# Patient Record
Sex: Female | Born: 1935 | Race: White | Hispanic: No | Marital: Married | Smoking: Never smoker
Health system: Southern US, Community
[De-identification: ages and names within clinical notes are randomized; demographics above are authoritative.]

## PROBLEM LIST (undated history)

## (undated) DIAGNOSIS — M1990 Unspecified osteoarthritis, unspecified site: Secondary | ICD-10-CM

## (undated) DIAGNOSIS — F329 Major depressive disorder, single episode, unspecified: Secondary | ICD-10-CM

## (undated) DIAGNOSIS — Z1379 Encounter for other screening for genetic and chromosomal anomalies: Principal | ICD-10-CM

## (undated) DIAGNOSIS — M545 Low back pain: Secondary | ICD-10-CM

## (undated) DIAGNOSIS — Z923 Personal history of irradiation: Secondary | ICD-10-CM

## (undated) DIAGNOSIS — G8929 Other chronic pain: Secondary | ICD-10-CM

## (undated) DIAGNOSIS — N3090 Cystitis, unspecified without hematuria: Secondary | ICD-10-CM

## (undated) DIAGNOSIS — G4733 Obstructive sleep apnea (adult) (pediatric): Secondary | ICD-10-CM

## (undated) DIAGNOSIS — M549 Dorsalgia, unspecified: Secondary | ICD-10-CM

## (undated) DIAGNOSIS — E7800 Pure hypercholesterolemia, unspecified: Secondary | ICD-10-CM

## (undated) DIAGNOSIS — Z9989 Dependence on other enabling machines and devices: Secondary | ICD-10-CM

## (undated) DIAGNOSIS — C50919 Malignant neoplasm of unspecified site of unspecified female breast: Secondary | ICD-10-CM

## (undated) DIAGNOSIS — F0390 Unspecified dementia without behavioral disturbance: Secondary | ICD-10-CM

## (undated) DIAGNOSIS — E669 Obesity, unspecified: Secondary | ICD-10-CM

## (undated) DIAGNOSIS — F419 Anxiety disorder, unspecified: Secondary | ICD-10-CM

## (undated) DIAGNOSIS — E119 Type 2 diabetes mellitus without complications: Secondary | ICD-10-CM

## (undated) DIAGNOSIS — I10 Essential (primary) hypertension: Secondary | ICD-10-CM

## (undated) DIAGNOSIS — J189 Pneumonia, unspecified organism: Secondary | ICD-10-CM

## (undated) DIAGNOSIS — R51 Headache: Secondary | ICD-10-CM

## (undated) DIAGNOSIS — N8501 Benign endometrial hyperplasia: Secondary | ICD-10-CM

## (undated) DIAGNOSIS — R413 Other amnesia: Secondary | ICD-10-CM

## (undated) HISTORY — DX: Dependence on other enabling machines and devices: Z99.89

## (undated) HISTORY — DX: Low back pain: M54.5

## (undated) HISTORY — DX: Other amnesia: R41.3

## (undated) HISTORY — DX: Encounter for other screening for genetic and chromosomal anomalies: Z13.79

## (undated) HISTORY — PX: MASTECTOMY: SHX3

## (undated) HISTORY — PX: BREAST LUMPECTOMY: SHX2

## (undated) HISTORY — DX: Pure hypercholesterolemia, unspecified: E78.00

## (undated) HISTORY — DX: Other chronic pain: G89.29

## (undated) HISTORY — DX: Major depressive disorder, single episode, unspecified: F32.9

## (undated) HISTORY — DX: Benign endometrial hyperplasia: N85.01

## (undated) HISTORY — DX: Cystitis, unspecified without hematuria: N30.90

## (undated) HISTORY — DX: Headache: R51

## (undated) HISTORY — DX: Obesity, unspecified: E66.9

## (undated) HISTORY — DX: Unspecified osteoarthritis, unspecified site: M19.90

## (undated) HISTORY — PX: FOOT NEUROMA SURGERY: SHX646

## (undated) HISTORY — DX: Obstructive sleep apnea (adult) (pediatric): G47.33

---

## 1960-07-18 HISTORY — PX: TONSILLECTOMY: SUR1361

## 1968-07-18 HISTORY — PX: TUBAL LIGATION: SHX77

## 1998-12-08 ENCOUNTER — Ambulatory Visit (HOSPITAL_COMMUNITY): Admission: RE | Admit: 1998-12-08 | Discharge: 1998-12-08 | Payer: Self-pay | Admitting: Internal Medicine

## 1998-12-08 ENCOUNTER — Encounter: Payer: Self-pay | Admitting: Internal Medicine

## 1998-12-15 ENCOUNTER — Encounter: Payer: Self-pay | Admitting: Internal Medicine

## 1998-12-15 ENCOUNTER — Ambulatory Visit (HOSPITAL_COMMUNITY): Admission: RE | Admit: 1998-12-15 | Discharge: 1998-12-15 | Payer: Self-pay | Admitting: Internal Medicine

## 1999-06-16 ENCOUNTER — Ambulatory Visit (HOSPITAL_COMMUNITY): Admission: RE | Admit: 1999-06-16 | Discharge: 1999-06-16 | Payer: Self-pay | Admitting: Internal Medicine

## 1999-06-16 ENCOUNTER — Encounter: Payer: Self-pay | Admitting: Internal Medicine

## 1999-07-22 ENCOUNTER — Other Ambulatory Visit: Admission: RE | Admit: 1999-07-22 | Discharge: 1999-07-22 | Payer: Self-pay | Admitting: Internal Medicine

## 1999-12-31 ENCOUNTER — Encounter: Payer: Self-pay | Admitting: General Surgery

## 1999-12-31 ENCOUNTER — Encounter: Admission: RE | Admit: 1999-12-31 | Discharge: 1999-12-31 | Payer: Self-pay | Admitting: General Surgery

## 2001-03-05 ENCOUNTER — Ambulatory Visit (HOSPITAL_COMMUNITY): Admission: RE | Admit: 2001-03-05 | Discharge: 2001-03-05 | Payer: Self-pay | Admitting: General Surgery

## 2001-03-05 ENCOUNTER — Encounter: Payer: Self-pay | Admitting: General Surgery

## 2002-07-18 DIAGNOSIS — N8501 Benign endometrial hyperplasia: Secondary | ICD-10-CM

## 2002-07-18 HISTORY — PX: DILATION AND CURETTAGE OF UTERUS: SHX78

## 2002-07-18 HISTORY — DX: Benign endometrial hyperplasia: N85.01

## 2002-11-15 ENCOUNTER — Encounter: Admission: RE | Admit: 2002-11-15 | Discharge: 2002-11-15 | Payer: Self-pay | Admitting: Internal Medicine

## 2002-11-15 ENCOUNTER — Encounter: Payer: Self-pay | Admitting: Internal Medicine

## 2002-12-10 ENCOUNTER — Other Ambulatory Visit: Admission: RE | Admit: 2002-12-10 | Discharge: 2002-12-10 | Payer: Self-pay | Admitting: *Deleted

## 2002-12-12 ENCOUNTER — Ambulatory Visit (HOSPITAL_COMMUNITY): Admission: RE | Admit: 2002-12-12 | Discharge: 2002-12-12 | Payer: Self-pay | Admitting: Internal Medicine

## 2002-12-12 ENCOUNTER — Encounter: Payer: Self-pay | Admitting: Internal Medicine

## 2002-12-19 ENCOUNTER — Encounter: Payer: Self-pay | Admitting: Internal Medicine

## 2002-12-19 ENCOUNTER — Encounter: Admission: RE | Admit: 2002-12-19 | Discharge: 2002-12-19 | Payer: Self-pay | Admitting: Internal Medicine

## 2002-12-20 ENCOUNTER — Ambulatory Visit (HOSPITAL_COMMUNITY): Admission: RE | Admit: 2002-12-20 | Discharge: 2002-12-20 | Payer: Self-pay | Admitting: *Deleted

## 2002-12-20 ENCOUNTER — Encounter (INDEPENDENT_AMBULATORY_CARE_PROVIDER_SITE_OTHER): Payer: Self-pay | Admitting: Specialist

## 2003-07-10 ENCOUNTER — Encounter (INDEPENDENT_AMBULATORY_CARE_PROVIDER_SITE_OTHER): Payer: Self-pay | Admitting: Specialist

## 2003-07-10 ENCOUNTER — Ambulatory Visit (HOSPITAL_COMMUNITY): Admission: RE | Admit: 2003-07-10 | Discharge: 2003-07-10 | Payer: Self-pay | Admitting: *Deleted

## 2003-12-14 ENCOUNTER — Encounter: Admission: RE | Admit: 2003-12-14 | Discharge: 2003-12-14 | Payer: Self-pay | Admitting: Internal Medicine

## 2005-04-27 ENCOUNTER — Encounter: Admission: RE | Admit: 2005-04-27 | Discharge: 2005-04-27 | Payer: Self-pay | Admitting: General Surgery

## 2005-05-09 ENCOUNTER — Encounter (INDEPENDENT_AMBULATORY_CARE_PROVIDER_SITE_OTHER): Payer: Self-pay | Admitting: Radiology

## 2005-05-09 ENCOUNTER — Encounter (INDEPENDENT_AMBULATORY_CARE_PROVIDER_SITE_OTHER): Payer: Self-pay | Admitting: *Deleted

## 2005-05-09 ENCOUNTER — Encounter: Admission: RE | Admit: 2005-05-09 | Discharge: 2005-05-09 | Payer: Self-pay | Admitting: General Surgery

## 2005-05-19 ENCOUNTER — Encounter: Admission: RE | Admit: 2005-05-19 | Discharge: 2005-05-19 | Payer: Self-pay | Admitting: General Surgery

## 2005-05-20 ENCOUNTER — Encounter (INDEPENDENT_AMBULATORY_CARE_PROVIDER_SITE_OTHER): Payer: Self-pay | Admitting: Specialist

## 2005-05-20 ENCOUNTER — Encounter: Admission: RE | Admit: 2005-05-20 | Discharge: 2005-05-20 | Payer: Self-pay | Admitting: General Surgery

## 2005-07-01 ENCOUNTER — Encounter (INDEPENDENT_AMBULATORY_CARE_PROVIDER_SITE_OTHER): Payer: Self-pay | Admitting: *Deleted

## 2005-07-01 ENCOUNTER — Ambulatory Visit (HOSPITAL_COMMUNITY): Admission: RE | Admit: 2005-07-01 | Discharge: 2005-07-01 | Payer: Self-pay | Admitting: General Surgery

## 2005-07-01 ENCOUNTER — Encounter: Admission: RE | Admit: 2005-07-01 | Discharge: 2005-07-01 | Payer: Self-pay | Admitting: General Surgery

## 2005-07-01 ENCOUNTER — Ambulatory Visit (HOSPITAL_BASED_OUTPATIENT_CLINIC_OR_DEPARTMENT_OTHER): Admission: RE | Admit: 2005-07-01 | Discharge: 2005-07-01 | Payer: Self-pay | Admitting: General Surgery

## 2005-07-04 ENCOUNTER — Ambulatory Visit: Payer: Self-pay | Admitting: Oncology

## 2005-08-16 ENCOUNTER — Ambulatory Visit (HOSPITAL_COMMUNITY): Admission: RE | Admit: 2005-08-16 | Discharge: 2005-08-16 | Payer: Self-pay | Admitting: Oncology

## 2005-08-22 ENCOUNTER — Ambulatory Visit: Admission: RE | Admit: 2005-08-22 | Discharge: 2005-10-26 | Payer: Self-pay | Admitting: Radiation Oncology

## 2005-08-25 ENCOUNTER — Encounter: Admission: RE | Admit: 2005-08-25 | Discharge: 2005-08-25 | Payer: Self-pay | Admitting: Oncology

## 2005-10-17 ENCOUNTER — Ambulatory Visit: Payer: Self-pay | Admitting: Oncology

## 2006-01-09 ENCOUNTER — Encounter: Admission: RE | Admit: 2006-01-09 | Discharge: 2006-01-09 | Payer: Self-pay | Admitting: General Surgery

## 2006-01-11 ENCOUNTER — Encounter: Admission: RE | Admit: 2006-01-11 | Discharge: 2006-01-11 | Payer: Self-pay | Admitting: General Surgery

## 2006-01-11 ENCOUNTER — Encounter (INDEPENDENT_AMBULATORY_CARE_PROVIDER_SITE_OTHER): Payer: Self-pay | Admitting: Specialist

## 2006-01-16 ENCOUNTER — Ambulatory Visit: Payer: Self-pay | Admitting: Oncology

## 2006-01-23 LAB — CBC WITH DIFFERENTIAL/PLATELET
BASO%: 0.3 % (ref 0.0–2.0)
Basophils Absolute: 0 10*3/uL (ref 0.0–0.1)
Eosinophils Absolute: 0.1 10*3/uL (ref 0.0–0.5)
HCT: 36.9 % (ref 34.8–46.6)
HGB: 12.7 g/dL (ref 11.6–15.9)
LYMPH%: 27.5 % (ref 14.0–48.0)
MCHC: 34.4 g/dL (ref 32.0–36.0)
MCV: 92.1 fL (ref 81.0–101.0)
MONO#: 0.5 10*3/uL (ref 0.1–0.9)
MONO%: 8.2 % (ref 0.0–13.0)
NEUT#: 4.1 10*3/uL (ref 1.5–6.5)
Platelets: 243 10*3/uL (ref 145–400)

## 2006-01-23 LAB — COMPREHENSIVE METABOLIC PANEL
AST: 17 U/L (ref 0–37)
Albumin: 4.3 g/dL (ref 3.5–5.2)
Alkaline Phosphatase: 75 U/L (ref 39–117)
BUN: 31 mg/dL — ABNORMAL HIGH (ref 6–23)
Calcium: 9.9 mg/dL (ref 8.4–10.5)
Chloride: 104 mEq/L (ref 96–112)
Potassium: 5.1 mEq/L (ref 3.5–5.3)
Sodium: 138 mEq/L (ref 135–145)
Total Protein: 7.2 g/dL (ref 6.0–8.3)

## 2006-01-23 LAB — CANCER ANTIGEN 27.29: CA 27.29: 19 U/mL (ref 0–39)

## 2006-03-08 ENCOUNTER — Ambulatory Visit: Payer: Self-pay | Admitting: Oncology

## 2006-05-03 ENCOUNTER — Encounter: Admission: RE | Admit: 2006-05-03 | Discharge: 2006-05-03 | Payer: Self-pay | Admitting: General Surgery

## 2006-07-21 ENCOUNTER — Ambulatory Visit: Payer: Self-pay | Admitting: Oncology

## 2006-07-26 LAB — COMPREHENSIVE METABOLIC PANEL
ALT: 11 U/L (ref 0–35)
AST: 14 U/L (ref 0–37)
Albumin: 4.4 g/dL (ref 3.5–5.2)
Alkaline Phosphatase: 69 U/L (ref 39–117)
BUN: 26 mg/dL — ABNORMAL HIGH (ref 6–23)
CO2: 25 mEq/L (ref 19–32)
Chloride: 107 mEq/L (ref 96–112)
Creatinine, Ser: 1.36 mg/dL — ABNORMAL HIGH (ref 0.40–1.20)
Total Bilirubin: 0.4 mg/dL (ref 0.3–1.2)
Total Protein: 7.3 g/dL (ref 6.0–8.3)

## 2006-07-26 LAB — CBC WITH DIFFERENTIAL/PLATELET
Basophils Absolute: 0 10*3/uL (ref 0.0–0.1)
HCT: 37.9 % (ref 34.8–46.6)
LYMPH%: 28 % (ref 14.0–48.0)
MCH: 31.8 pg (ref 26.0–34.0)
MCV: 94.8 fL (ref 81.0–101.0)
MONO#: 0.3 10*3/uL (ref 0.1–0.9)
NEUT%: 62.4 % (ref 39.6–76.8)
RDW: 13.6 % (ref 11.3–14.5)

## 2007-01-22 ENCOUNTER — Ambulatory Visit: Payer: Self-pay | Admitting: Oncology

## 2007-01-24 LAB — CBC WITH DIFFERENTIAL/PLATELET
BASO%: 0.5 % (ref 0.0–2.0)
Basophils Absolute: 0 10*3/uL (ref 0.0–0.1)
EOS%: 1.9 % (ref 0.0–7.0)
HCT: 37.4 % (ref 34.8–46.6)
MCH: 32 pg (ref 26.0–34.0)
MONO#: 0.6 10*3/uL (ref 0.1–0.9)
MONO%: 11.6 % (ref 0.0–13.0)
RBC: 4.07 10*6/uL (ref 3.70–5.32)
WBC: 4.8 10*3/uL (ref 3.9–10.0)

## 2007-01-24 LAB — COMPREHENSIVE METABOLIC PANEL
ALT: 12 U/L (ref 0–35)
Alkaline Phosphatase: 69 U/L (ref 39–117)
CO2: 26 mEq/L (ref 19–32)
Chloride: 99 mEq/L (ref 96–112)
Potassium: 4.6 mEq/L (ref 3.5–5.3)
Total Bilirubin: 0.6 mg/dL (ref 0.3–1.2)
Total Protein: 7 g/dL (ref 6.0–8.3)

## 2007-03-15 ENCOUNTER — Encounter: Admission: RE | Admit: 2007-03-15 | Discharge: 2007-03-15 | Payer: Self-pay | Admitting: Orthopedic Surgery

## 2007-03-27 ENCOUNTER — Ambulatory Visit: Payer: Self-pay | Admitting: Oncology

## 2007-04-19 ENCOUNTER — Other Ambulatory Visit: Admission: RE | Admit: 2007-04-19 | Discharge: 2007-04-19 | Payer: Self-pay | Admitting: Obstetrics & Gynecology

## 2007-05-10 ENCOUNTER — Encounter: Admission: RE | Admit: 2007-05-10 | Discharge: 2007-05-10 | Payer: Self-pay | Admitting: Oncology

## 2007-05-17 ENCOUNTER — Encounter: Admission: RE | Admit: 2007-05-17 | Discharge: 2007-05-17 | Payer: Self-pay | Admitting: Oncology

## 2007-05-24 ENCOUNTER — Ambulatory Visit: Payer: Self-pay | Admitting: Oncology

## 2007-07-23 ENCOUNTER — Ambulatory Visit: Payer: Self-pay | Admitting: Oncology

## 2007-07-25 ENCOUNTER — Ambulatory Visit (HOSPITAL_COMMUNITY): Admission: RE | Admit: 2007-07-25 | Discharge: 2007-07-25 | Payer: Self-pay | Admitting: Oncology

## 2007-07-25 LAB — CBC WITH DIFFERENTIAL/PLATELET
BASO%: 0.4 % (ref 0.0–2.0)
EOS%: 3.7 % (ref 0.0–7.0)
Eosinophils Absolute: 0.2 10*3/uL (ref 0.0–0.5)
LYMPH%: 35.8 % (ref 14.0–48.0)
MCHC: 34.7 g/dL (ref 32.0–36.0)
MONO#: 0.4 10*3/uL (ref 0.1–0.9)
MONO%: 7.6 % (ref 0.0–13.0)
NEUT%: 52.5 % (ref 39.6–76.8)
RDW: 12.7 % (ref 11.3–14.5)
lymph#: 1.9 10*3/uL (ref 0.9–3.3)

## 2007-07-25 LAB — COMPREHENSIVE METABOLIC PANEL
Albumin: 3.5 g/dL (ref 3.5–5.2)
Alkaline Phosphatase: 58 U/L (ref 39–117)
CO2: 29 mEq/L (ref 19–32)
Calcium: 9.8 mg/dL (ref 8.4–10.5)
Chloride: 105 mEq/L (ref 96–112)
Glucose, Bld: 109 mg/dL — ABNORMAL HIGH (ref 70–99)
Potassium: 4.8 mEq/L (ref 3.5–5.3)
Sodium: 140 mEq/L (ref 135–145)
Total Protein: 6.7 g/dL (ref 6.0–8.3)

## 2008-04-23 ENCOUNTER — Other Ambulatory Visit: Admission: RE | Admit: 2008-04-23 | Discharge: 2008-04-23 | Payer: Self-pay | Admitting: Obstetrics & Gynecology

## 2008-05-06 ENCOUNTER — Ambulatory Visit: Payer: Self-pay | Admitting: Oncology

## 2008-05-08 LAB — CBC WITH DIFFERENTIAL/PLATELET
BASO%: 0.5 % (ref 0.0–2.0)
HCT: 33.3 % — ABNORMAL LOW (ref 34.8–46.6)
HGB: 11.4 g/dL — ABNORMAL LOW (ref 11.6–15.9)
MCHC: 34.2 g/dL (ref 32.0–36.0)
NEUT#: 2.4 10*3/uL (ref 1.5–6.5)
NEUT%: 50.3 % (ref 39.6–76.8)
Platelets: 259 10*3/uL (ref 145–400)
RBC: 3.57 10*6/uL — ABNORMAL LOW (ref 3.70–5.32)
RDW: 13.1 % (ref 11.3–14.5)

## 2008-05-08 LAB — COMPREHENSIVE METABOLIC PANEL
AST: 12 U/L (ref 0–37)
Albumin: 4 g/dL (ref 3.5–5.2)
Alkaline Phosphatase: 39 U/L (ref 39–117)
BUN: 47 mg/dL — ABNORMAL HIGH (ref 6–23)
CO2: 27 mEq/L (ref 19–32)
Calcium: 9.8 mg/dL (ref 8.4–10.5)
Creatinine, Ser: 2.55 mg/dL — ABNORMAL HIGH (ref 0.40–1.20)
Potassium: 4.7 mEq/L (ref 3.5–5.3)
Total Protein: 6.5 g/dL (ref 6.0–8.3)

## 2008-05-14 ENCOUNTER — Encounter: Admission: RE | Admit: 2008-05-14 | Discharge: 2008-05-14 | Payer: Self-pay | Admitting: Oncology

## 2008-06-05 ENCOUNTER — Encounter: Admission: RE | Admit: 2008-06-05 | Discharge: 2008-06-05 | Payer: Self-pay | Admitting: Internal Medicine

## 2008-09-17 ENCOUNTER — Encounter (INDEPENDENT_AMBULATORY_CARE_PROVIDER_SITE_OTHER): Payer: Self-pay | Admitting: *Deleted

## 2008-09-17 ENCOUNTER — Ambulatory Visit (HOSPITAL_COMMUNITY): Admission: RE | Admit: 2008-09-17 | Discharge: 2008-09-17 | Payer: Self-pay | Admitting: *Deleted

## 2009-05-05 ENCOUNTER — Ambulatory Visit: Payer: Self-pay | Admitting: Oncology

## 2009-05-07 LAB — CBC WITH DIFFERENTIAL/PLATELET
Eosinophils Absolute: 0.1 10*3/uL (ref 0.0–0.5)
HGB: 12.2 g/dL (ref 11.6–15.9)
MCH: 31.3 pg (ref 25.1–34.0)
MCHC: 34 g/dL (ref 31.5–36.0)
MCV: 91.9 fL (ref 79.5–101.0)
MONO%: 8.3 % (ref 0.0–14.0)
NEUT#: 5.8 10*3/uL (ref 1.5–6.5)
NEUT%: 71.2 % (ref 38.4–76.8)
Platelets: 246 10*3/uL (ref 145–400)
RBC: 3.91 10*6/uL (ref 3.70–5.45)
WBC: 8.1 10*3/uL (ref 3.9–10.3)

## 2009-05-07 LAB — COMPREHENSIVE METABOLIC PANEL
Calcium: 9.4 mg/dL (ref 8.4–10.5)
Chloride: 104 mEq/L (ref 96–112)

## 2009-12-07 ENCOUNTER — Encounter: Admission: RE | Admit: 2009-12-07 | Discharge: 2009-12-07 | Payer: Self-pay | Admitting: Obstetrics & Gynecology

## 2010-01-11 ENCOUNTER — Ambulatory Visit: Payer: Self-pay | Admitting: Surgery

## 2010-03-18 DEATH — deceased

## 2010-05-28 ENCOUNTER — Ambulatory Visit: Payer: Self-pay | Admitting: Oncology

## 2010-06-01 LAB — CBC WITH DIFFERENTIAL/PLATELET
BASO%: 0.3 % (ref 0.0–2.0)
Basophils Absolute: 0 10*3/uL (ref 0.0–0.1)
EOS%: 1.9 % (ref 0.0–7.0)
LYMPH%: 27.4 % (ref 14.0–49.7)
MCH: 31.9 pg (ref 25.1–34.0)
MONO#: 0.6 10*3/uL (ref 0.1–0.9)
MONO%: 10.2 % (ref 0.0–14.0)
NEUT%: 60.2 % (ref 38.4–76.8)
RBC: 3.83 10*6/uL (ref 3.70–5.45)

## 2010-06-01 LAB — COMPREHENSIVE METABOLIC PANEL
ALT: 15 U/L (ref 0–35)
Alkaline Phosphatase: 50 U/L (ref 39–117)
CO2: 30 mEq/L (ref 19–32)
Calcium: 10 mg/dL (ref 8.4–10.5)
Chloride: 105 mEq/L (ref 96–112)
Glucose, Bld: 99 mg/dL (ref 70–99)
Potassium: 4.8 mEq/L (ref 3.5–5.3)
Total Bilirubin: 0.5 mg/dL (ref 0.3–1.2)

## 2010-08-07 ENCOUNTER — Encounter: Payer: Self-pay | Admitting: General Surgery

## 2010-10-28 LAB — GLUCOSE, CAPILLARY: Glucose-Capillary: 90 mg/dL (ref 70–99)

## 2010-11-30 NOTE — Op Note (Signed)
NAME:  MIDGIE, ACAMPORA NO.:  [id number]   MEDICAL RECORD NO.:  [id number]          PATIENT TYPE:  AMB   LOCATION:  ENDO                         FACILITY:  [facility]   PHYSICIAN:  Waverly Ferrari, M.D.    DATE OF BIRTH:  10/13/1933   DATE OF PROCEDURE:  09/17/2008  DATE OF DISCHARGE:                               OPERATIVE REPORT   PROCEDURE:  Colonoscopy with polypectomy.   INDICATIONS:  Colon polyps.   ANESTHESIA:  Fentanyl 75 mcg, Versed 7 mg.   PROCEDURE:  With the patient mildly sedated in the left lateral  decubitus position, the Pentax videoscopic colonoscope was inserted into  the rectum and passed under direct vision to the cecum, identified by  ileocecal valve and appendiceal orifice, both of which were  photographed.  From this point the colonoscope was slowly withdrawn,  taking circumferential views of colonic mucosa.  The prep was suboptimal  in that we had to suction greenish material from multiple areas, so  small lesions could be missed as we withdrew all the way to  approximately 15 cm from the anal verge at which point a polyp was seen,  photographed and removed using snare cautery technique, setting of  20/150 blended current. The polyp was retrieved by suctioning through  the endoscope into a tissue trap.  The endoscope was then withdrawn to  the rectum, which appeared normal on direct and showed hemorrhoids on  retroflexed view.  The endoscope was straightened and withdrawn through  the anal canal.  The patient's vital signs, pulse oximeter remained  stable.  The patient tolerated procedure well, without apparent  complications.   FINDINGS:  Internal hemorrhoids and polyp at 15 cm from the anal verge.   PLAN:  Await biopsy report.  The patient will call me for results and  follow up with me as an outpatient.           ______________________________  Waverly Ferrari, M.D.     GMO/MEDQ  D:  09/17/2008  T:  09/17/2008  Job:  [id number]

## 2010-11-30 NOTE — Procedures (Signed)
DUPLEX DEEP VENOUS EXAM - LOWER EXTREMITY   INDICATION:  Pain and swelling in right lower extremity.   HISTORY:  Edema:  Right.  Trauma/Surgery:  None.  Pain:  Right.  PE:  None.  Previous DVT:  None.  Anticoagulants:  None.  Other:   DUPLEX EXAM:                CFV   SFV   PopV  PTV    GSV                R  L  R  L  R  L  R   L  R  L  Thrombosis    o  o  o     o     o      o  Spontaneous   +  +  +     +     +      +  Phasic        +  +  +     +     +      +  Augmentation  +  +  +     +     +      +  Compressible  +  +  +     +     +      +  Competent     +  +  +     +     +      +   Legend:  + - yes  o - no  p - partial  D - decreased   IMPRESSION:  The right lower extremity appears to be negative for deep  venous thrombosis.   Attempted to call in preliminary report.  No one called back, so  preliminary report was faxed.    _____________________________  V. Leia Alf, MD   NT/MEDQ  D:  01/11/2010  T:  01/11/2010  Job:  [id number]

## 2010-12-23 ENCOUNTER — Other Ambulatory Visit: Payer: Self-pay | Admitting: Internal Medicine

## 2010-12-23 DIAGNOSIS — R413 Other amnesia: Secondary | ICD-10-CM

## 2010-12-31 ENCOUNTER — Ambulatory Visit
Admission: RE | Admit: 2010-12-31 | Discharge: 2010-12-31 | Disposition: A | Discharge status: Home / Self Care | Payer: Medicare Other | Source: Ambulatory Visit | Attending: Internal Medicine | Admitting: Internal Medicine

## 2010-12-31 DIAGNOSIS — R413 Other amnesia: Secondary | ICD-10-CM

## 2011-01-06 ENCOUNTER — Other Ambulatory Visit: Payer: Self-pay

## 2011-07-19 DEATH — deceased

## 2011-09-07 ENCOUNTER — Other Ambulatory Visit: Payer: Self-pay | Admitting: Internal Medicine

## 2011-09-07 DIAGNOSIS — Z853 Personal history of malignant neoplasm of breast: Secondary | ICD-10-CM

## 2011-09-15 ENCOUNTER — Other Ambulatory Visit: Payer: Self-pay | Admitting: Internal Medicine

## 2011-09-15 ENCOUNTER — Ambulatory Visit
Admission: RE | Admit: 2011-09-15 | Discharge: 2011-09-15 | Disposition: A | Discharge status: Home / Self Care | Payer: Medicare Other | Source: Ambulatory Visit | Attending: Internal Medicine | Admitting: Internal Medicine

## 2011-09-15 DIAGNOSIS — Z853 Personal history of malignant neoplasm of breast: Secondary | ICD-10-CM

## 2012-05-01 ENCOUNTER — Other Ambulatory Visit: Payer: Self-pay | Admitting: Urology

## 2012-05-05 ENCOUNTER — Encounter (HOSPITAL_COMMUNITY): Payer: Self-pay | Admitting: Emergency Medicine

## 2012-05-05 ENCOUNTER — Emergency Department (HOSPITAL_COMMUNITY)
Admission: EM | Admit: 2012-05-05 | Discharge: 2012-05-05 | Disposition: A | Discharge status: Home / Self Care | Payer: Medicare Other | Attending: Emergency Medicine | Admitting: Emergency Medicine

## 2012-05-05 ENCOUNTER — Emergency Department (HOSPITAL_COMMUNITY): Payer: Medicare Other

## 2012-05-05 DIAGNOSIS — R251 Tremor, unspecified: Secondary | ICD-10-CM

## 2012-05-05 DIAGNOSIS — I10 Essential (primary) hypertension: Secondary | ICD-10-CM | POA: Insufficient documentation

## 2012-05-05 DIAGNOSIS — E119 Type 2 diabetes mellitus without complications: Secondary | ICD-10-CM | POA: Insufficient documentation

## 2012-05-05 DIAGNOSIS — F419 Anxiety disorder, unspecified: Secondary | ICD-10-CM

## 2012-05-05 DIAGNOSIS — R259 Unspecified abnormal involuntary movements: Secondary | ICD-10-CM | POA: Insufficient documentation

## 2012-05-05 DIAGNOSIS — F0390 Unspecified dementia without behavioral disturbance: Secondary | ICD-10-CM | POA: Insufficient documentation

## 2012-05-05 DIAGNOSIS — F411 Generalized anxiety disorder: Secondary | ICD-10-CM | POA: Insufficient documentation

## 2012-05-05 HISTORY — DX: Unspecified dementia, unspecified severity, without behavioral disturbance, psychotic disturbance, mood disturbance, and anxiety: F03.90

## 2012-05-05 HISTORY — DX: Type 2 diabetes mellitus without complications: E11.9

## 2012-05-05 HISTORY — DX: Essential (primary) hypertension: I10

## 2012-05-05 LAB — CBC WITH DIFFERENTIAL/PLATELET
Eosinophils Relative: 2 % (ref 0–5)
HCT: 38.8 % (ref 36.0–46.0)
Lymphocytes Relative: 28 % (ref 12–46)
Lymphs Abs: 1.8 10*3/uL (ref 0.7–4.0)
MCV: 92.2 fL (ref 78.0–100.0)
Platelets: 191 10*3/uL (ref 150–400)
RBC: 4.21 MIL/uL (ref 3.87–5.11)
WBC: 6.2 10*3/uL (ref 4.0–10.5)

## 2012-05-05 LAB — BASIC METABOLIC PANEL
CO2: 27 mEq/L (ref 19–32)
Calcium: 9.4 mg/dL (ref 8.4–10.5)
Chloride: 106 mEq/L (ref 96–112)
Glucose, Bld: 83 mg/dL (ref 70–99)
Sodium: 140 mEq/L (ref 135–145)

## 2012-05-05 LAB — URINALYSIS, ROUTINE W REFLEX MICROSCOPIC
Glucose, UA: NEGATIVE mg/dL
Hgb urine dipstick: NEGATIVE
Specific Gravity, Urine: 1.011 (ref 1.005–1.030)
pH: 6 (ref 5.0–8.0)

## 2012-05-05 MED ORDER — ALPRAZOLAM 0.25 MG PO TABS
0.2500 mg | ORAL_TABLET | Freq: Once | ORAL | Status: AC
  Administered 2012-05-05: 0.25 mg via ORAL
  Filled 2012-05-05: qty 1

## 2012-05-05 MED ORDER — ALPRAZOLAM 0.25 MG PO TABS: 0.2500 mg | ORAL_TABLET | Freq: Three times a day (TID) | ORAL | Status: DC | PRN

## 2012-05-05 NOTE — ED Notes (Signed)
Pt here with tremors starting this am; pt denies hx of same; pt sts was cold earlier and has a HA at present x 3 days

## 2012-05-05 NOTE — ED Provider Notes (Signed)
History     CSN: [id number]  Arrival date & time 05/05/12  1236   First MD Initiated Contact with Patient 05/05/12 1402      Chief Complaint  Patient presents with  . Tremors    (Consider location/radiation/quality/duration/timing/severity/associated sxs/prior treatment) The history is provided by the patient.  pt c/o feeling anxious, tremulous in past few days. States has been under a lot of stress and is very worried about planned upcoming hysterectomy (?for uterine prolapse).  States intermittently during days gets episode entire body tremulousness lasting 1-2 seconds per episode. No change in level of consciousness. Is able to speak normally, even during episodes. No change in speech or vision. No numbness or weakness. No problems w balance or coordination. No numbness/weakness. No fever or chills. Eating and drinking normally. Denies recent change in meds or new meds, except to say that pcp added fluoxetine for similar symptoms just a few days ago.     Past Medical History  Diagnosis Date  . Hypertension   . Diabetes mellitus without complication   . Dementia     History reviewed. No pertinent past surgical history.  History reviewed. No pertinent family history.  History  Substance Use Topics  . Smoking status: Never Smoker   . Smokeless tobacco: Not on file  . Alcohol Use: No    OB History    Grav Para Term Preterm Abortions TAB SAB Ect Mult Living                  Review of Systems  Constitutional: Negative for fever, chills and diaphoresis.  HENT: Negative for sore throat and neck pain.   Eyes: Negative for visual disturbance.  Respiratory: Negative for shortness of breath.   Cardiovascular: Negative for chest pain, palpitations and leg swelling.  Gastrointestinal: Negative for vomiting, abdominal pain and diarrhea.  Genitourinary: Negative for dysuria and flank pain.  Musculoskeletal: Negative for back pain.  Skin: Negative for rash.  Neurological:  Negative for weakness, numbness and headaches.  Hematological: Does not bruise/bleed easily.  Psychiatric/Behavioral: Negative for confusion.    Allergies  Penicillins  Home Medications  No current outpatient prescriptions on file.  BP 159/118  Pulse 65  Temp 98 F (36.7 C) (Oral)  Resp 20  SpO2 97%  Physical Exam  Nursing note and vitals reviewed. Constitutional: She is oriented to person, place, and time. She appears well-developed and well-nourished. No distress.  HENT:  Nose: Nose normal.  Mouth/Throat: Oropharynx is clear and moist.  Eyes: Conjunctivae normal are normal. Pupils are equal, round, and reactive to light. No scleral icterus.  Neck: Neck supple. No tracheal deviation present.  Cardiovascular: Normal rate, regular rhythm, normal heart sounds and intact distal pulses.   Pulmonary/Chest: Effort normal and breath sounds normal. No respiratory distress.  Abdominal: Soft. Normal appearance and bowel sounds are normal. She exhibits no distension. There is no tenderness.  Genitourinary:       No cva tenderness  Musculoskeletal: She exhibits no edema and no tenderness.  Neurological: She is alert and oriented to person, place, and time. No cranial nerve deficit.       Motor intact bil.  Skin: Skin is warm and dry. No rash noted.  Psychiatric:       Anxious.     ED Course  Procedures (including critical care time)  Results for orders placed during the hospital encounter of 05/05/12  GLUCOSE, CAPILLARY      Component Value Range   Glucose-Capillary 85  70 -  99 mg/dL  CBC WITH DIFFERENTIAL      Component Value Range   WBC 6.2  4.0 - 10.5 K/uL   RBC 4.21  3.87 - 5.11 MIL/uL   Hemoglobin 12.8  12.0 - 15.0 g/dL   HCT 38.8  36.0 - 46.0 %   MCV 92.2  78.0 - 100.0 fL   MCH 30.4  26.0 - 34.0 pg   MCHC 33.0  30.0 - 36.0 g/dL   RDW 13.5  11.5 - 15.5 %   Platelets 191  150 - 400 K/uL   Neutrophils Relative 63  43 - 77 %   Neutro Abs 3.9  1.7 - 7.7 K/uL    Lymphocytes Relative 28  12 - 46 %   Lymphs Abs 1.8  0.7 - 4.0 K/uL   Monocytes Relative 6  3 - 12 %   Monocytes Absolute 0.4  0.1 - 1.0 K/uL   Eosinophils Relative 2  0 - 5 %   Eosinophils Absolute 0.1  0.0 - 0.7 K/uL   Basophils Relative 0  0 - 1 %   Basophils Absolute 0.0  0.0 - 0.1 K/uL  BASIC METABOLIC PANEL      Component Value Range   Sodium 140  135 - 145 mEq/L   Potassium 4.5  3.5 - 5.1 mEq/L   Chloride 106  96 - 112 mEq/L   CO2 27  19 - 32 mEq/L   Glucose, Bld 83  70 - 99 mg/dL   BUN 24 (*) 6 - 23 mg/dL   Creatinine, Ser 1.69 (*) 0.50 - 1.10 mg/dL   Calcium 9.4  8.4 - 10.5 mg/dL   GFR calc non Af Amer 28 (*) >90 mL/min   GFR calc Af Amer 33 (*) >90 mL/min   Ct Head Wo Contrast  05/05/2012  *RADIOLOGY REPORT*  Clinical Data: Tremors, dementia  CT HEAD WITHOUT CONTRAST  Technique:  Contiguous axial images were obtained from the base of the skull through the vertex without contrast.  Comparison: 12/31/2010  Findings: No skull fracture is noted.  Paranasal sinuses and mastoid air cells are unremarkable.  No intracranial hemorrhage, mass effect or midline shift.  Stable cerebral atrophy.  Stable mild periventricular white matter decreased attenuation probable due to chronic small vessel ischemic changes.  No acute infarction.  No mass lesion is noted on this unenhanced scan.  IMPRESSION: No acute intracranial abnormality.  Stable mild cerebral atrophy.   Original Report Authenticated By: Lahoma Crocker, M.D.        MDM  Labs/ct ordered from triage provider.  Pt c/o anxiety/nerves, worried about upcoming surgery.  Xanax po.  Recheck calm, alert. Cr mildly elevated however c/w prior mild CRI.  ua pending. Signed out to Dr Lita Mains, check ua when back, if positive, add abx coverage for uti.           Mirna Mires, MD 05/05/12 1524

## 2012-05-05 NOTE — ED Notes (Signed)
NAD noted at time of d/c home with husband.

## 2012-05-05 NOTE — ED Notes (Signed)
Husband at bedside and reports pt had not been taking Prozac until 2 days ago when she restarted the Prozac. Husband reports it is 40mg  daily in the AM

## 2012-05-05 NOTE — ED Notes (Signed)
Pt states she is very nervous about up coming surgery in December.

## 2012-05-16 ENCOUNTER — Other Ambulatory Visit: Payer: Self-pay | Admitting: Urology

## 2012-06-20 ENCOUNTER — Encounter (HOSPITAL_COMMUNITY): Payer: Self-pay | Admitting: Pharmacist

## 2012-06-21 ENCOUNTER — Other Ambulatory Visit: Payer: Self-pay

## 2012-06-21 ENCOUNTER — Encounter (HOSPITAL_COMMUNITY)
Admission: RE | Admit: 2012-06-21 | Discharge: 2012-06-21 | Disposition: A | Discharge status: Home / Self Care | Payer: Medicare Other | Source: Ambulatory Visit | Attending: Obstetrics and Gynecology | Admitting: Obstetrics and Gynecology

## 2012-06-21 ENCOUNTER — Encounter (HOSPITAL_COMMUNITY): Payer: Self-pay

## 2012-06-21 HISTORY — DX: Other chronic pain: G89.29

## 2012-06-21 HISTORY — DX: Dorsalgia, unspecified: M54.9

## 2012-06-21 HISTORY — DX: Anxiety disorder, unspecified: F41.9

## 2012-06-21 LAB — BASIC METABOLIC PANEL
Calcium: 9.9 mg/dL (ref 8.4–10.5)
GFR calc Af Amer: 32 mL/min — ABNORMAL LOW (ref >90)
GFR calc non Af Amer: 28 mL/min — ABNORMAL LOW (ref >90)
Potassium: 3.9 mEq/L (ref 3.5–5.1)
Sodium: 139 mEq/L (ref 135–145)

## 2012-06-21 LAB — CBC
Hemoglobin: 13.4 g/dL (ref 12.0–15.0)
Platelets: 206 10*3/uL (ref 150–400)
RBC: 4.44 MIL/uL (ref 3.87–5.11)
WBC: 8 10*3/uL (ref 4.0–10.5)

## 2012-06-21 LAB — SURGICAL PCR SCREEN
MRSA, PCR: NEGATIVE
Staphylococcus aureus: NEGATIVE

## 2012-06-21 LAB — PROTIME-INR: Prothrombin Time: 13.1 seconds (ref 11.6–15.2)

## 2012-06-21 NOTE — Patient Instructions (Addendum)
Your procedure is scheduled on:07/03/12  Enter through the Main Entrance at : 6am Pick up desk phone and dial [phone] and inform us of your arrival.  Please call [phone] if you have any problems the morning of surgery.  Remember: Do not eat or drink after midnight:Monday   Take these meds the morning of surgery with a sip of water:blood pressure meds   DO NOT wear jewelry, eye make-up, lipstick,body lotion, or dark fingernail polish. Do not shave for 48 hours prior to surgery.  If you are to be admitted after surgery, leave suitcase in car until your room has been assigned.

## 2012-07-02 MED ORDER — GENTAMICIN SULFATE 40 MG/ML IJ SOLN
110.0000 mg | INTRAVENOUS | Status: AC
  Administered 2012-07-03: 110 mg via INTRAVENOUS
  Filled 2012-07-02: qty 2.75

## 2012-07-02 NOTE — H&P (Signed)
Kiara Nelson is an 78 y.o. female.   Chief Complaint: pelvic pressure and urinary incontinence HPI: 78 yo MWF G3P3  Past Medical History  Diagnosis Date  . Hypertension   . Diabetes mellitus without complication   . Dementia   . Back pain, chronic     lumbar up to thoracic area  . Anxiety   high cholesterol, renal insufficiency, obesity, Left breast cancer with lumpectomy and radiation  Past Surgical History  Procedure Date  . Breast surgery   . Foot neuroma surgery   BTSP, D & C, tonsillectomy  No family history on file. Social History:  reports that she has never smoked. She does not have any smokeless tobacco history on file. She reports that she does not drink alcohol or use illicit drugs.  Allergies:  Allergies  Allergen Reactions  . Bee Venom Swelling  . Penicillins Swelling    Swelling of hands  sulfa  No prescriptions prior to admission  See list  No results found for this or any previous visit (from the past 48 hour(s)). No results found.  Review of Systems  All other systems reviewed and are negative.    There were no vitals taken for this visit. Physical Exam  Constitutional: She is oriented to person, place, and time. She appears well-developed and well-nourished.  HENT:  Head: Normocephalic and atraumatic.  Eyes: Conjunctivae normal are normal.  Neck: No tracheal deviation present. No thyromegaly present.  Cardiovascular: Normal rate and regular rhythm.   Respiratory: Effort normal and breath sounds normal.  GI: Soft. Bowel sounds are normal. She exhibits no distension. There is tenderness.  Genitourinary:       Grade 3 cystocele and grade 2 rectocele with grade 3 uterine prolapse.  Musculoskeletal: Normal range of motion.  Neurological: She is alert and oriented to person, place, and time.  Skin: Skin is warm and dry.  Psychiatric: She has a normal mood and affect. Her behavior is normal.     Assessment/Plan TVH by me with prolapse  repair by Dr. McDiarmid  Brindley Madarang P 07/02/2012, 8:46 PM

## 2012-07-02 NOTE — H&P (Signed)
History of Present Illness   Kiara Nelson has symptomatic prolapse. She has mild stress and urge incontinence if her bladder is full. She wears one pad a day which is damp. She voids every 60-90 minutes and has a poor flow. She can feel vaginal bulging. Her pelvic examination was more difficulty because of obesity. She had a small grade 3 cystocele that just reached the introitus. Her cervix would descend 2-3 cm. She had a small grade 2 rectocele that was difficult to visualize. She had a mild positive cough test. She emptied efficiently. Cystoscopically her bladder examination was normal. I thought that her poor flow could be aggravated by her prolapse. I thought she might be a good candidate to consider transvaginal hysterectomy plus cystocele repair and graft. She may need a vault suspension or 4-corner graft. An intraoperative decision will be made regarding a rectocele repair. I thought it was important to educate her about constipation which is not related to her prolapse.   On urodynamics she did not void and was catheterized for 50 mL. Maximum capacity was 313 mL. Her bladder was unstable reaching pressures of 12 cm of water associated with severe leakage. She had very little warning. She had severe leakage with a Valsalva leak-point pressure of 45 cm of water. This is at 100 mL. At 200 mL she leaked at a pressure of 47 cm of water and it was moderate to severe. During voluntary voiding she voided 313 mL with a maximum flow of 20 mL per second. Maximum voiding pressure was 12 cm of water. She emptied efficiently. The contraction was not that well sustained. EMG activity was normal. Fluoroscopically bladder neck descended approximately 2 cm. She had a hypersensitive bladder with no warning signs. Her stress incontinence is with and without the prolapse reduced. The details of the urodynamics are signed and dictated on the urodynamic sheet.    Past Medical History Problems  1. History of  Arthritis  V13.4 2. History of  Breast Cancer V10.3 3. History of  Diabetes Mellitus 250.00 4. History of  Hypercholesterolemia 272.0 5. History of  Hypertension 401.9  Surgical History Problems  1. History of  Arm Incision 2. History of  Breast Surgery Lumpectomy 3. History of  Tonsillectomy  Current Meds 1. Aleve TABS; Therapy: (Recorded:07Aug2013) to 2. Ambien 5 MG Oral Tablet; Therapy: (Recorded:07Aug2013) to 3. Calcium TABS; Therapy: (Recorded:07Aug2013) to 4. Cinnamon CAPS; Therapy: (Recorded:07Aug2013) to 5. Donepezil HCl 10 MG Oral Tablet; Therapy: 04Apr2013 to 6. Fenofibrate 48 MG Oral Tablet; Therapy: [id number] to 7. Fish Oil 1000 MG Oral Capsule; Therapy: (Recorded:07Aug2013) to 8. Losartan Potassium 50 MG Oral Tablet; Therapy: [id number] to 9. Lovastatin 20 MG Oral Tablet; Therapy: 04Apr2013 to 10. Multi-Vitamin TABS; Therapy: (Recorded:07Aug2013) to 11. Onglyza 5 MG Oral Tablet; Therapy: (Recorded:07Aug2013) to 12. Potassium TABS; Therapy: (Recorded:07Aug2013) to 13. Vitamin C TABS; Therapy: (Recorded:07Aug2013) to 14. Vitamin D TABS; Therapy: (Recorded:07Aug2013) to  Allergies Medication  1. Penicillins  Family History Problems  1. Paternal history of  Death In The Family Father father passed @ age 30unknown 2. Maternal history of  Death In The Family Mother mother passed @ age 69heart 3. Family history of  Family Health Status Number Of Children 1 son2 daughters 4. Paternal history of  Hypertension V17.49 5. Maternal history of  Hypertension V17.49  Social History Problems  1. Marital History - Single 2. Occupation: school Therapist, nutritional Denied  3. History of  Alcohol Use 4. History of  Caffeine Use 5. History of  Tobacco Use  Assessment Assessed  1. Urge And Stress Incontinence 788.33 2. Cystocele 596.89  Plan Cystocele (596.89)  1. Follow-up PRN Office  Follow-up  Requested for: [id number]  Discussion/Summary   I drew Ms. Carothers a picture. She  understands that if we were going to treat her incontinence we would treat her with medical and behavioral therapy and not surgically, at least initially. I talked to her about watchful waiting versus a pessary versus a transvaginal hysterectomy with cystocele repair and graft and possible vault suspension and possible rectocele repair.   I drew her a picture and we talked about prolapse surgery in detail. Pros, cons, general surgical and anesthetic risks, and other options including behavioral therapy, pessaries, and watchful waiting were discussed. She understands that prolapse repairs are successful in 80-85% of cases for prolapse symptoms and can recur anteriorly, posteriorly, and/or apically. She understands that in most cases I use a graft and general risks were discussed. Surgical risks were described but not limited to the discussion of injury to neighboring structures including the bowel (with possible life-threatening sepsis and colostomy), bladder, urethra, vagina (all resulting in further surgery), and ureter (resulting in re-implantation). We talked about injury to nerves/soft tissue leading to debilitating and intractable pelvic, abdominal, and lower extremity pain syndromes and neuropathies. The risks of buttock pain, intractable dyspareunia, and vaginal narrowing and shortening with sequelae were discussed. Bleeding risks, transfusion rates, and infection were discussed. The risk of persistent, de novo, or worsening bladder and/or bowel incontinence/dysfunction was discussed. The need for CIC was described as well the usual post-operative course. The patient understands that she might not reach her treatment goal and that she might be worse following surgery.  I talked to her about watchful waiting versus a sling.  We talked about a sling in detail. Pros, cons, general surgical and anesthetic risks, and other options including behavioral therapy and watchful waiting were discussed. She  understands that slings are generally successful in 90% of cases for stress incontinence, 50% for urge incontinence, and that in a small percentage of cases the incontinence can worsen. The risk of persistent, de novo, or worsening incontinence/dysfunction was discussed. Risks were described but not limited to the discussion of injury to neighboring structures including the bowel (with possible life-threatening sepsis and colostomy), bladder, urethra, vagina (all resulting in further surgery), and ureter (resulting in re-implantation). We also talked about the risk of retention requiring urethrolysis, extrusion requiring revision, and erosion resulting in further surgery. Bleeding risks and transfusion rates and the risk of infection were discussed. The risk of pelvic and abdominal pain syndromes, dyspareunia, and neuropathies were discussed. The need for CIC was described as well as the usual postoperative course. The patient understands that she might not reach her treatment goal and that she might be worse following surgery. Mesh TV issues were discussed.   Ms. Decoux slow flow was also discussed and she understands it may not improve after surgery but I am hoping it will.   I was glad her husband was with her and we went over goals in detail and took a lot of time. I believe she would like to proceed with prolapse surgery but we are not going to do a sling. We talked about worsening incontinence and a delayed sling several months down the road. She is going to call Dr. Sabra Heck to discuss a hysterectomy. We will proceed accordingly when we hear from Dr. Sabra Heck.  After a thorough review of the management options for the patient's  condition the patient  elected to proceed with surgical therapy as noted above. We have discussed the potential benefits and risks of the procedure, side effects of the proposed treatment, the likelihood of the patient achieving the goals of the procedure, and any potential problems  that might occur during the procedure or recuperation. Informed consent has been obtained.

## 2012-07-03 ENCOUNTER — Encounter (HOSPITAL_COMMUNITY): Payer: Self-pay

## 2012-07-03 ENCOUNTER — Encounter (HOSPITAL_COMMUNITY): Payer: Self-pay | Admitting: Anesthesiology

## 2012-07-03 ENCOUNTER — Encounter (HOSPITAL_COMMUNITY)
Admission: RE | Disposition: A | Discharge status: Home / Self Care | Payer: Self-pay | Source: Ambulatory Visit | Attending: Obstetrics and Gynecology

## 2012-07-03 ENCOUNTER — Inpatient Hospital Stay (HOSPITAL_COMMUNITY): Payer: Medicare Other | Admitting: Anesthesiology

## 2012-07-03 ENCOUNTER — Encounter (HOSPITAL_COMMUNITY): Payer: Self-pay | Admitting: General Practice

## 2012-07-03 ENCOUNTER — Ambulatory Visit (HOSPITAL_COMMUNITY)
Admission: RE | Admit: 2012-07-03 | Discharge: 2012-07-04 | Disposition: A | Discharge status: Home / Self Care | Payer: Medicare Other | Source: Ambulatory Visit | Attending: Obstetrics and Gynecology | Admitting: Obstetrics and Gynecology

## 2012-07-03 DIAGNOSIS — N812 Incomplete uterovaginal prolapse: Principal | ICD-10-CM | POA: Insufficient documentation

## 2012-07-03 DIAGNOSIS — Z23 Encounter for immunization: Secondary | ICD-10-CM | POA: Insufficient documentation

## 2012-07-03 HISTORY — PX: CYSTOSCOPY: SHX5120

## 2012-07-03 HISTORY — PX: VAGINAL HYSTERECTOMY: SHX2639

## 2012-07-03 HISTORY — PX: EXAMINATION UNDER ANESTHESIA: SHX1540

## 2012-07-03 LAB — COMPREHENSIVE METABOLIC PANEL
ALT: 9 U/L (ref 0–35)
AST: 18 U/L (ref 0–37)
CO2: 29 mEq/L (ref 19–32)
Calcium: 10.1 mg/dL (ref 8.4–10.5)
Chloride: 101 mEq/L (ref 96–112)
GFR calc Af Amer: 32 mL/min — ABNORMAL LOW (ref >90)
GFR calc non Af Amer: 28 mL/min — ABNORMAL LOW (ref >90)
Glucose, Bld: 91 mg/dL (ref 70–99)
Sodium: 139 mEq/L (ref 135–145)
Total Bilirubin: 0.4 mg/dL (ref 0.3–1.2)

## 2012-07-03 LAB — GLUCOSE, CAPILLARY: Glucose-Capillary: 149 mg/dL — ABNORMAL HIGH (ref 70–99)

## 2012-07-03 LAB — CBC
Hemoglobin: 13.3 g/dL (ref 12.0–15.0)
MCH: 30 pg (ref 26.0–34.0)
MCV: 92.6 fL (ref 78.0–100.0)
Platelets: 216 10*3/uL (ref 150–400)
RBC: 4.44 MIL/uL (ref 3.87–5.11)
WBC: 6.4 10*3/uL (ref 4.0–10.5)

## 2012-07-03 SURGERY — HYSTERECTOMY, VAGINAL
Anesthesia: General

## 2012-07-03 MED ORDER — 0.9 % SODIUM CHLORIDE (POUR BTL) OPTIME
TOPICAL | Status: DC | PRN
  Administered 2012-07-03: 1000 mL

## 2012-07-03 MED ORDER — INDIGOTINDISULFONATE SODIUM 8 MG/ML IJ SOLN
INTRAMUSCULAR | Status: AC
  Filled 2012-07-03: qty 5

## 2012-07-03 MED ORDER — METRONIDAZOLE IN NACL 5-0.79 MG/ML-% IV SOLN
500.0000 mg | INTRAVENOUS | Status: AC
  Administered 2012-07-03: 500 mg via INTRAVENOUS
  Filled 2012-07-03: qty 100

## 2012-07-03 MED ORDER — PNEUMOCOCCAL VAC POLYVALENT 25 MCG/0.5ML IJ INJ
0.5000 mL | INJECTION | INTRAMUSCULAR | Status: AC
  Administered 2012-07-04: 0.5 mL via INTRAMUSCULAR
  Filled 2012-07-03: qty 0.5

## 2012-07-03 MED ORDER — LOSARTAN POTASSIUM 50 MG PO TABS
50.0000 mg | ORAL_TABLET | Freq: Every day | ORAL | Status: DC
  Administered 2012-07-03 – 2012-07-04 (×2): 50 mg via ORAL
  Filled 2012-07-03 (×3): qty 1

## 2012-07-03 MED ORDER — ALPRAZOLAM 0.25 MG PO TABS
0.2500 mg | ORAL_TABLET | Freq: Three times a day (TID) | ORAL | Status: DC | PRN
  Administered 2012-07-03: 0.25 mg via ORAL
  Filled 2012-07-03: qty 1

## 2012-07-03 MED ORDER — MIDAZOLAM HCL 2 MG/2ML IJ SOLN
INTRAMUSCULAR | Status: AC
  Filled 2012-07-03: qty 2

## 2012-07-03 MED ORDER — PROPOFOL 10 MG/ML IV BOLUS
INTRAVENOUS | Status: DC | PRN
  Administered 2012-07-03: 110 mg via INTRAVENOUS

## 2012-07-03 MED ORDER — LIDOCAINE-EPINEPHRINE (PF) 1 %-1:200000 IJ SOLN
INTRAMUSCULAR | Status: DC | PRN
  Administered 2012-07-03: 10 mL

## 2012-07-03 MED ORDER — SODIUM CHLORIDE 0.9 % IR SOLN
Freq: Once | Status: AC
  Administered 2012-07-03: 10:00:00
  Filled 2012-07-03: qty 1

## 2012-07-03 MED ORDER — ESTRADIOL 0.1 MG/GM VA CREA
TOPICAL_CREAM | VAGINAL | Status: AC
  Filled 2012-07-03: qty 42.5

## 2012-07-03 MED ORDER — GLYCOPYRROLATE 0.2 MG/ML IJ SOLN
INTRAMUSCULAR | Status: DC | PRN
  Administered 2012-07-03: 0.2 mg via INTRAVENOUS
  Administered 2012-07-03: .7 mg via INTRAVENOUS

## 2012-07-03 MED ORDER — DEXAMETHASONE SODIUM PHOSPHATE 10 MG/ML IJ SOLN
INTRAMUSCULAR | Status: DC | PRN
  Administered 2012-07-03: 10 mg via INTRAVENOUS

## 2012-07-03 MED ORDER — ROCURONIUM BROMIDE 50 MG/5ML IV SOLN
INTRAVENOUS | Status: AC
  Filled 2012-07-03: qty 1

## 2012-07-03 MED ORDER — LACTATED RINGERS IV SOLN
INTRAVENOUS | Status: DC
  Administered 2012-07-03: 21:00:00 via INTRAVENOUS

## 2012-07-03 MED ORDER — ROCURONIUM BROMIDE 100 MG/10ML IV SOLN
INTRAVENOUS | Status: DC | PRN
  Administered 2012-07-03 (×3): 10 mg via INTRAVENOUS
  Administered 2012-07-03: 50 mg via INTRAVENOUS

## 2012-07-03 MED ORDER — DEXAMETHASONE SODIUM PHOSPHATE 10 MG/ML IJ SOLN
INTRAMUSCULAR | Status: AC
  Filled 2012-07-03: qty 1

## 2012-07-03 MED ORDER — FENOFIBRATE 54 MG PO TABS
54.0000 mg | ORAL_TABLET | Freq: Every day | ORAL | Status: DC
  Administered 2012-07-03 – 2012-07-04 (×2): 54 mg via ORAL
  Filled 2012-07-03 (×3): qty 1

## 2012-07-03 MED ORDER — DONEPEZIL HCL 10 MG PO TABS
10.0000 mg | ORAL_TABLET | Freq: Every day | ORAL | Status: DC
  Administered 2012-07-03: 10 mg via ORAL
  Filled 2012-07-03 (×2): qty 1

## 2012-07-03 MED ORDER — FENTANYL CITRATE 0.05 MG/ML IJ SOLN: 25.0000 ug | INTRAMUSCULAR | Status: DC | PRN

## 2012-07-03 MED ORDER — MIDAZOLAM HCL 5 MG/5ML IJ SOLN
INTRAMUSCULAR | Status: DC | PRN
  Administered 2012-07-03 (×2): 1 mg via INTRAVENOUS

## 2012-07-03 MED ORDER — METOCLOPRAMIDE HCL 5 MG/ML IJ SOLN: 10.0000 mg | Freq: Once | INTRAMUSCULAR | Status: DC | PRN

## 2012-07-03 MED ORDER — NEOSTIGMINE METHYLSULFATE 1 MG/ML IJ SOLN
INTRAMUSCULAR | Status: AC
  Filled 2012-07-03: qty 1

## 2012-07-03 MED ORDER — NEOSTIGMINE METHYLSULFATE 1 MG/ML IJ SOLN
INTRAMUSCULAR | Status: DC | PRN
  Administered 2012-07-03: 3 mg via INTRAVENOUS

## 2012-07-03 MED ORDER — LIDOCAINE HCL (CARDIAC) 20 MG/ML IV SOLN
INTRAVENOUS | Status: DC | PRN
  Administered 2012-07-03: 40 mg via INTRAVENOUS

## 2012-07-03 MED ORDER — INDIGOTINDISULFONATE SODIUM 8 MG/ML IJ SOLN
INTRAMUSCULAR | Status: DC | PRN
  Administered 2012-07-03: 5 mL via INTRAVENOUS

## 2012-07-03 MED ORDER — OXYCODONE-ACETAMINOPHEN 5-325 MG PO TABS
1.0000 | ORAL_TABLET | ORAL | Status: DC | PRN
  Administered 2012-07-04: 2 via ORAL
  Administered 2012-07-04: 1 via ORAL
  Filled 2012-07-03: qty 2
  Filled 2012-07-03: qty 1

## 2012-07-03 MED ORDER — GLYCOPYRROLATE 0.2 MG/ML IJ SOLN
INTRAMUSCULAR | Status: AC
  Filled 2012-07-03: qty 2

## 2012-07-03 MED ORDER — PROPOFOL 10 MG/ML IV EMUL
INTRAVENOUS | Status: AC
  Filled 2012-07-03: qty 20

## 2012-07-03 MED ORDER — LIDOCAINE-EPINEPHRINE (PF) 1 %-1:200000 IJ SOLN
INTRAMUSCULAR | Status: AC
  Filled 2012-07-03: qty 20

## 2012-07-03 MED ORDER — ONDANSETRON HCL 4 MG/2ML IJ SOLN
INTRAMUSCULAR | Status: DC | PRN
  Administered 2012-07-03: 4 mg via INTRAVENOUS

## 2012-07-03 MED ORDER — FENTANYL CITRATE 0.05 MG/ML IJ SOLN
INTRAMUSCULAR | Status: AC
  Filled 2012-07-03: qty 5

## 2012-07-03 MED ORDER — LIDOCAINE HCL (CARDIAC) 20 MG/ML IV SOLN
INTRAVENOUS | Status: AC
  Filled 2012-07-03: qty 5

## 2012-07-03 MED ORDER — LINAGLIPTIN 5 MG PO TABS
5.0000 mg | ORAL_TABLET | Freq: Every day | ORAL | Status: DC
  Administered 2012-07-03 – 2012-07-04 (×2): 5 mg via ORAL
  Filled 2012-07-03 (×3): qty 1

## 2012-07-03 MED ORDER — FENTANYL CITRATE 0.05 MG/ML IJ SOLN
INTRAMUSCULAR | Status: DC | PRN
  Administered 2012-07-03 (×2): 50 ug via INTRAVENOUS
  Administered 2012-07-03: 150 ug via INTRAVENOUS
  Administered 2012-07-03 (×3): 50 ug via INTRAVENOUS
  Administered 2012-07-03: 100 ug via INTRAVENOUS

## 2012-07-03 MED ORDER — MEPERIDINE HCL 25 MG/ML IJ SOLN: 6.2500 mg | INTRAMUSCULAR | Status: DC | PRN

## 2012-07-03 MED ORDER — MORPHINE SULFATE 4 MG/ML IJ SOLN: 1.0000 mg | INTRAMUSCULAR | Status: DC | PRN

## 2012-07-03 MED ORDER — STERILE WATER FOR IRRIGATION IR SOLN
Status: DC | PRN
  Administered 2012-07-03: 1000 mL via INTRAVESICAL

## 2012-07-03 MED ORDER — LACTATED RINGERS IV SOLN
INTRAVENOUS | Status: DC
  Administered 2012-07-03 (×2): via INTRAVENOUS

## 2012-07-03 MED ORDER — ONDANSETRON HCL 4 MG/2ML IJ SOLN
INTRAMUSCULAR | Status: AC
  Filled 2012-07-03: qty 2

## 2012-07-03 SURGICAL SUPPLY — 63 items
ADH SKN CLS APL DERMABOND .7 (GAUZE/BANDAGES/DRESSINGS)
BLADE SURG 15 STRL LF C SS BP (BLADE) ×2 IMPLANT
BLADE SURG 15 STRL SS (BLADE) ×3
CANISTER SUCTION 2500CC (MISCELLANEOUS) ×6 IMPLANT
CATH FOLEY 2WAY SLVR  5CC 16FR (CATHETERS)
CATH FOLEY 2WAY SLVR 5CC 16FR (CATHETERS) IMPLANT
CATH ROBINSON RED A/P 16FR (CATHETERS) IMPLANT
CONT PATH 16OZ SNAP LID 3702 (MISCELLANEOUS) IMPLANT
DECANTER SPIKE VIAL GLASS SM (MISCELLANEOUS) IMPLANT
DERMABOND ADVANCED (GAUZE/BANDAGES/DRESSINGS)
DERMABOND ADVANCED .7 DNX12 (GAUZE/BANDAGES/DRESSINGS) IMPLANT
DEVICE CAPIO SLIM SINGLE (INSTRUMENTS) IMPLANT
DRAIN PENROSE 1/4X12 LTX (DRAIN) ×3 IMPLANT
DRAPE STERI URO 9X17 APER PCH (DRAPES) ×3 IMPLANT
DRAPE UNDERBUTTOCKS STRL (DRAPE) ×3 IMPLANT
DRESSING TELFA 8X3 (GAUZE/BANDAGES/DRESSINGS) ×3 IMPLANT
FORMULA 20CAL 3 OZ MEAD (FORMULA) IMPLANT
GAUZE PACKING 2X5 YD STERILE (GAUZE/BANDAGES/DRESSINGS) IMPLANT
GAUZE SPONGE 4X4 16PLY XRAY LF (GAUZE/BANDAGES/DRESSINGS) ×6 IMPLANT
GLOVE BIO SURGEON STRL SZ7.5 (GLOVE) ×6 IMPLANT
GLOVE BIO SURGEON STRL SZ8 (GLOVE) ×6 IMPLANT
GLOVE BIOGEL PI IND STRL 7.0 (GLOVE) ×8 IMPLANT
GLOVE BIOGEL PI INDICATOR 7.0 (GLOVE) ×6
GLOVE ECLIPSE 6.5 STRL STRAW (GLOVE) ×7 IMPLANT
GLOVE NEODERM STER SZ 7 (GLOVE) ×2 IMPLANT
GLOVE SURG SS PI 7.0 STRL IVOR (GLOVE) ×6 IMPLANT
GOWN PREVENTION PLUS LG XLONG (DISPOSABLE) ×10 IMPLANT
GOWN STRL REIN XL XLG (GOWN DISPOSABLE) ×22 IMPLANT
NDL MAYO 6 CRC TAPER PT (NEEDLE) IMPLANT
NDL SPNL 22GX3.5 QUINCKE BK (NEEDLE) IMPLANT
NEEDLE HYPO 22GX1.5 SAFETY (NEEDLE) ×3 IMPLANT
NEEDLE MAYO .5 CIRCLE (NEEDLE) IMPLANT
NEEDLE MAYO 6 CRC TAPER PT (NEEDLE) IMPLANT
NEEDLE SPNL 22GX3.5 QUINCKE BK (NEEDLE) IMPLANT
NS IRRIG 1000ML POUR BTL (IV SOLUTION) ×5 IMPLANT
PACK VAGINAL WOMENS (CUSTOM PROCEDURE TRAY) ×3 IMPLANT
PAD OB MATERNITY 4.3X12.25 (PERSONAL CARE ITEMS) ×3 IMPLANT
PENCIL BUTTON HOLSTER BLD 10FT (ELECTRODE) ×3 IMPLANT
PLUG CATH AND CAP STER (CATHETERS) ×3 IMPLANT
RETRACTOR STAY HOOK 5MM (MISCELLANEOUS) ×3 IMPLANT
SET CYSTO W/LG BORE CLAMP LF (SET/KITS/TRAYS/PACK) ×3 IMPLANT
SHEET LAVH (DRAPES) ×3 IMPLANT
SUT CAPIO ETHIBPND (SUTURE) IMPLANT
SUT SILK 2 0 FS (SUTURE) IMPLANT
SUT VIC AB 0 CT1 18XCR BRD8 (SUTURE) ×6 IMPLANT
SUT VIC AB 0 CT1 27 (SUTURE) ×6
SUT VIC AB 0 CT1 27XBRD ANBCTR (SUTURE) ×4 IMPLANT
SUT VIC AB 0 CT1 36 (SUTURE) ×6 IMPLANT
SUT VIC AB 0 CT1 8-18 (SUTURE) ×9
SUT VIC AB 0 CT2 27 (SUTURE) IMPLANT
SUT VIC AB 2-0 CT1 (SUTURE) ×9 IMPLANT
SUT VIC AB 2-0 CT1 27 (SUTURE) ×3
SUT VIC AB 2-0 CT1 TAPERPNT 27 (SUTURE) ×1 IMPLANT
SUT VIC AB 2-0 SH 27 (SUTURE) ×9
SUT VIC AB 2-0 SH 27XBRD (SUTURE) ×6 IMPLANT
SUT VIC AB 3-0 PS2 18 (SUTURE)
SUT VIC AB 3-0 PS2 18XBRD (SUTURE) IMPLANT
SUT VICRYL 0 TIES 12 18 (SUTURE) ×3 IMPLANT
TOWEL OR 17X24 6PK STRL BLUE (TOWEL DISPOSABLE) ×11 IMPLANT
TRAY FOLEY CATH 14FR (SET/KITS/TRAYS/PACK) ×3 IMPLANT
TUBING CONNECTING 10 (TUBING) ×3 IMPLANT
TUBING NON-CON 1/4 X 20 CONN (TUBING) ×3 IMPLANT
WATER STERILE IRR 1000ML POUR (IV SOLUTION) ×6 IMPLANT

## 2012-07-03 NOTE — Anesthesia Postprocedure Evaluation (Signed)
Anesthesia Post Note  Patient: Kiara Nelson  Procedure(s) Performed: Procedure(s) (LRB): HYSTERECTOMY VAGINAL (N/A) EXAM UNDER ANESTHESIA (N/A) CYSTOSCOPY ()  Anesthesia type: General  Patient location: [facility]  Post pain: Pain level controlled  Post assessment: Post-op Vital signs reviewed  Last Vitals:  Filed Vitals:   07/03/12 1700  BP: 114/70  Pulse: 54  Temp: 36.8 C  Resp: 16    Post vital signs: Reviewed  Level of consciousness: sedated  Complications: No apparent anesthesia complications

## 2012-07-03 NOTE — Anesthesia Postprocedure Evaluation (Signed)
  Anesthesia Post-op Note  Patient: Kiara Nelson  Procedure(s) Performed: Procedure(s) (LRB) with comments: HYSTERECTOMY VAGINAL (N/A) EXAM UNDER ANESTHESIA (N/A) CYSTOSCOPY ()  Patient Location: PACU  Anesthesia Type:General  Level of Consciousness: awake, alert  and oriented  Airway and Oxygen Therapy: Patient Spontanous Breathing  Post-op Pain: mild  Post-op Assessment: Post-op Vital signs reviewed, Patient's Cardiovascular Status Stable, Respiratory Function Stable, Patent Airway, No signs of Nausea or vomiting and Pain level controlled  Post-op Vital Signs: Reviewed and stable  Complications: No apparent anesthesia complications

## 2012-07-03 NOTE — Op Note (Signed)
Preoperative diagnosis: Uterine prolapse, with cystocele and rectocele Postoperative diagnosis: Same, path pending  procedure: Total vaginal hysterectomy - prolapse repair to be dictated by Dr. Nicki Reaper McDermott Surgeon: Dr. Elyse Hsu Assistant: Dr. Elveria Rising Anesthesia: Gen. endotracheal Estimated blood loss: [id number] cc Complications: None Procedure: The patient was taken to the operating room and was placed in the low dorsolithotomy position while she was away to ensure her comfort as the patient suffers from low back pain.  After induction of general endotracheal anesthesia she was prepped and draped in usual fashion, and a Foley catheter inserted.  It was necessary to tape up her pannus because as she was lying supine the pannus extended down below the mons, and partially obstructed the operative field.  A posterior weighted and anterior Deaver retractor were placed and the cervix was grasped on its anterior lip with a single-tooth tenaculum.  The vagina was quite long and narrow, and visualization of the cervix was difficult.  The mucosa anteriorly around the cervix was infiltrated with 1% Xylocaine with epinephrine.  The mucosa was incised with a knife and the bladder was dissected anteriorly using sharp and blunt dissection.  Attention was next turned posteriorly.  A posterior colpotomy incision was made, and a banana retractor placed into the posterior cul-de-sac.  The uterosacral ligaments were clamped cut and tied with 0 Vicryl.  Attention was next turned anteriorly.  The peritoneum was very high on the cervix and again visualization was somewhat difficult.  The anterior peritoneum was identified elevated and entered atraumatically.  The Deaver retractor was placed into the peritoneal space.  Hysterectomy then proceeded up the cardinal and broad ligament clamping cutting and tying in sequence.  The pedicle containing the utero-ovarian ligament tube and round ligament on each side was doubly  clamped cut and tied.  Specimen was sent to pathology.  An attempt was made to visualize the ovaries on each side and as they were quite high and there was bowel that prevented viewing of the ovaries, they were never seen.  The pedicles were inspected and were hemostatic.  The vaginal cuff was then run with a running locking suture of 0 Vicryl from 2 until 10:00 around the vaginal cuff.  Dr. Vikki Ports then came in to begin the prolapse repair and his part of the surgery will be dictated separately by him.

## 2012-07-03 NOTE — Op Note (Signed)
Preoperative diagnosis: Cystocele and small rectocele Postoperative diagnosis: Small cystocele Surgery: Examination under anesthesia, closure of vaginal cuff with mild reduction of cystocele, cystoscopy Surgeon: Dr. Nicki Reaper Carmel Waddington Assistant: Leta Baptist  The patient has the above diagnoses and consented the above procedure. Dr. had removed the uterus and run the cuff posteriorly. I agree with Dr. Christell Faith in when examined the patient exposure was significantly limited by a narrow pubic arch and a deep vagina and her obesity.  Having said that I used appropriate retractors and double-ring retractor and speculums to evaluate her prolapse. Her vaginal cuff had excellent support. She had no lateral defects with high fornices bilaterally. She had a small central bulge almost the size of of the distal aspect of my thumb. She had a very short anterior vaginal wall and it was even shorter laterally. Because of her obesity and depth vaginal flaps would not have even exit the introitus or if so minimally  After approximately 20 minutes of examining and thinking things over I felt that an anterior repair would not benefit the patient and the risks outweighed the benefits each other anatomy. She day she would've had one or 2 almost figure-of-eight sutures as the entire repair.  I closed the vaginal cuff from left to right and right to left with apical sutures of 0 Vicryl. It reduces cystocele even more so and not surprisingly. She had a very small cystocele in the case in very good support apically and posteriorly. The small anterior defect was well supported by the ureteral sacral sutures and the McCall culdoplasty  There was no rectocele to repair.  I cystoscoped the patient with a 42 [place] scope and 30 lens. She had very minimal cystocele cystoscopically. It was minimal to none. Ureters were not distorted. There was excellent blue jets bilaterally  The patient has a number of medical comorbidities  including obesity, chronic renal insufficiency, hypertension, and others listed in am hopeful that she will not have a recurrence when one evaluated natural history of cystoceles.

## 2012-07-03 NOTE — Addendum Note (Signed)
Addendum  created 07/03/12 1840 by Asher Muir, CRNA   Modules edited:Notes Section

## 2012-07-03 NOTE — Anesthesia Preprocedure Evaluation (Signed)
Anesthesia Evaluation  Patient identified by MRN, date of birth, ID band Patient awake    Reviewed: Allergy & Precautions, H&P , NPO status , Patient's Chart, lab work & pertinent test results  Airway Mallampati: II TM Distance: >3 FB Neck ROM: Full    Dental  (+) Teeth Intact   Pulmonary neg pulmonary ROS,  breath sounds clear to auscultation  Pulmonary exam normal       Cardiovascular hypertension, Pt. on medications Rhythm:Regular Rate:Normal     Neuro/Psych PSYCHIATRIC DISORDERS Anxiety Dementia-Mildnegative neurological ROS     GI/Hepatic negative GI ROS, Neg liver ROS,   Endo/Other  diabetes, Well Controlled, Type 2Morbid obesityHyperlipidemia  Renal/GU negative Renal ROS   Cystocele, Rectocele, Vaginal vault prolapse    Musculoskeletal negative musculoskeletal ROS (+)   Abdominal (+) + obese,   Peds  Hematology negative hematology ROS (+)   Anesthesia Other Findings   Reproductive/Obstetrics negative OB ROS                           Anesthesia Physical Anesthesia Plan  ASA: III  Anesthesia Plan: General   Post-op Pain Management:    Induction: Intravenous  Airway Management Planned: Oral ETT  Additional Equipment:   Intra-op Plan:   Post-operative Plan: Extubation in OR  Informed Consent: I have reviewed the patients History and Physical, chart, labs and discussed the procedure including the risks, benefits and alternatives for the proposed anesthesia with the patient or authorized representative who has indicated his/her understanding and acceptance.   Dental advisory given  Plan Discussed with: CRNA, Anesthesiologist and Surgeon  Anesthesia Plan Comments:         Anesthesia Quick Evaluation

## 2012-07-03 NOTE — Transfer of Care (Signed)
Immediate Anesthesia Transfer of Care Note  Patient: Kiara Nelson  Procedure(s) Performed: Procedure(s) (LRB) with comments: HYSTERECTOMY VAGINAL (N/A) EXAM UNDER ANESTHESIA (N/A) CYSTOSCOPY ()  Patient Location: PACU  Anesthesia Type:General  Level of Consciousness: sedated  Airway & Oxygen Therapy: Patient Spontanous Breathing and Patient connected to nasal cannula oxygen  Post-op Assessment: Report given to PACU RN and Post -op Vital signs reviewed and stable  Post vital signs: stable  Complications: No apparent anesthesia complications

## 2012-07-03 NOTE — Interval H&P Note (Signed)
History and Physical Interval Note:  07/03/2012 7:19 AM  Kiara Nelson  has presented today for surgery, with the diagnosis of uterine prolapse  The various methods of treatment have been discussed with the patient and family. After consideration of risks, benefits and other options for treatment, the patient has consented to  Procedure(s) (LRB) with comments: HYSTERECTOMY VAGINAL (N/A) ANTERIOR (CYSTOCELE) AND POSTERIOR REPAIR (RECTOCELE) (N/A) - with cysto VAGINAL VAULT SUSPENSION (N/A) - with graft 10x6 as a surgical intervention .  The patient's history has been reviewed, patient examined, no change in status, stable for surgery.  I have reviewed the patient's chart and labs.  Questions were answered to the patient's satisfaction.     ROMINE,CYNTHIA P

## 2012-07-03 NOTE — Progress Notes (Signed)
Pt awake and alert.  Findings at surgery explained to husband and daughters.  Pt reports she feels better now than she did before surgery.  Has voided.  Has ambulated to bathroom.    VSS, afebrile.  Abd obese, soft, NT.  No vag bleeding noted.  PAS in place.  CBG 134.  Stable post op.

## 2012-07-03 NOTE — OR Nursing (Signed)
0730  Patient positioned in stirrups prior to induction, patient verbalized no discomfort with the lithotomy position.

## 2012-07-04 ENCOUNTER — Encounter (HOSPITAL_COMMUNITY): Payer: Self-pay | Admitting: Obstetrics and Gynecology

## 2012-07-04 LAB — GLUCOSE, CAPILLARY
Glucose-Capillary: 110 mg/dL — ABNORMAL HIGH (ref 70–99)
Glucose-Capillary: 104 mg/dL — ABNORMAL HIGH (ref 70–99)

## 2012-07-04 NOTE — Progress Notes (Signed)
POD #1  VSS, afebrile.  Pt reports good pain control.  Has been up and ambulated to void.  No N & V.  Tolerated reg diet last night.  Oriented to time and place.  Eager to go home.  Abd obese, soft, NT.  Scant vag bleeding.  Extrem neg.  CBG's all under 200.  Good UOP.  Stable for d/c.  Instructions given to patient and husband.  Questions answered.  Has rx for percocet at home for post op pain.

## 2012-08-09 ENCOUNTER — Other Ambulatory Visit: Payer: Self-pay | Admitting: Internal Medicine

## 2012-08-14 ENCOUNTER — Other Ambulatory Visit: Payer: Self-pay | Admitting: Internal Medicine

## 2012-08-14 DIAGNOSIS — N644 Mastodynia: Secondary | ICD-10-CM

## 2012-08-23 ENCOUNTER — Ambulatory Visit
Admission: RE | Admit: 2012-08-23 | Discharge: 2012-08-23 | Disposition: A | Discharge status: Home / Self Care | Payer: Medicare Other | Source: Ambulatory Visit | Attending: Internal Medicine | Admitting: Internal Medicine

## 2012-08-23 ENCOUNTER — Other Ambulatory Visit: Payer: Self-pay | Admitting: Internal Medicine

## 2012-08-23 DIAGNOSIS — N644 Mastodynia: Secondary | ICD-10-CM

## 2012-09-15 HISTORY — PX: CATARACT EXTRACTION, BILATERAL: SHX1313

## 2012-12-11 ENCOUNTER — Encounter: Payer: Self-pay | Admitting: Obstetrics and Gynecology

## 2012-12-11 ENCOUNTER — Ambulatory Visit (INDEPENDENT_AMBULATORY_CARE_PROVIDER_SITE_OTHER): Payer: Medicare Other | Admitting: Obstetrics and Gynecology

## 2012-12-11 VITALS — BP 130/62 | Ht 63.5 in | Wt 233.0 lb

## 2012-12-11 DIAGNOSIS — Z01419 Encounter for gynecological examination (general) (routine) without abnormal findings: Secondary | ICD-10-CM

## 2012-12-11 NOTE — Patient Instructions (Signed)

## 2012-12-11 NOTE — Progress Notes (Signed)
79 y.o.   Married    Caucasian   female   G3P3   here for annual exam.  Bladder control reasonably good.  Wear a pad "just in case".    No LMP recorded. Patient is postmenopausal.          Sexually active: yes  The current method of family planning is status post hysterectomy and post menopausal status.    Exercising: trying Last mammogram:07/18/2012 normal   Last pap smear:10/27/11 neg History of abnormal pap: no  Smoking:no Alcohol: rarely Last colonoscopy:09/18/2011 normal, repeat in 10 years Last Bone Density:  05/14/08 normal, repeat in 5years Last tetanus shot: not sure Last cholesterol check: 05/2012 normal  Hgb:   pcp             Urine:pcp   Family History  Problem Relation Age of Onset  . Hypertension Mother   . Hypertension Father     Patient Active Problem List   Diagnosis Date Noted  . Dementia     Past Medical History  Diagnosis Date  . Hypertension   . Diabetes mellitus without complication   . Dementia   . Back pain, chronic     lumbar up to thoracic area  . Anxiety   . Recurrent cystitis   . Arthritis   . Hypercholesteremia   . Cancer     Breast cancer  . Complex endometrial hyperplasia 2004    renal insufficiency    Past Surgical History  Procedure Laterality Date  . Breast surgery    . Foot neuroma surgery    . Vaginal hysterectomy  07/03/2012    Procedure: HYSTERECTOMY VAGINAL;  Surgeon: Peri Maris, MD;  Location: [facility];  Service: Gynecology;  Laterality: N/A;  . Examination under anesthesia  07/03/2012    Procedure: EXAM UNDER ANESTHESIA;  Surgeon: Reece Packer, MD;  Location: [facility];  Service: Urology;  Laterality: N/A;  . Cystoscopy  07/03/2012    Procedure: CYSTOSCOPY;  Surgeon: Reece Packer, MD;  Location: [facility];  Service: Urology;;  . Tubal ligation  1970  . Dilation and curettage of uterus  2004    polyp resection  . Tonsillectomy  1962  . Cataract extraction, bilateral  09/2012    Allergies: Bee venom;  Penicillins; and Sulfa antibiotics  Current Outpatient Prescriptions  Medication Sig Dispense Refill  . ALPRAZolam (XANAX) 0.25 MG tablet Take 1 tablet (0.25 mg total) by mouth 3 (three) times daily as needed for anxiety.  15 tablet  0  . Calcium Carb-Cholecalciferol (CALCIUM-VITAMIN D) 600-400 MG-UNIT TABS Take 2 tablets by mouth daily.      . Cholecalciferol (VITAMIN D3) 1000 UNITS CAPS Take by mouth daily.      Marland Kitchen donepezil (ARICEPT) 10 MG tablet Take 10 mg by mouth at bedtime.      . fenofibrate (TRICOR) 48 MG tablet Take 48 mg by mouth daily.      Marland Kitchen FLUoxetine (PROZAC) 40 MG capsule Take 40 mg by mouth daily.      Marland Kitchen losartan (COZAAR) 50 MG tablet Take 50 mg by mouth daily.      . Multiple Vitamins-Minerals (MULTIVITAMIN WITH MINERALS) tablet Take 1 tablet by mouth daily.      . Omega-3 Fatty Acids (FISH OIL) 1000 MG CAPS Take 1 capsule by mouth 2 (two) times daily.      . Pyridoxine HCl (VITAMIN B6 PO) Take by mouth daily.      . saxagliptin HCl (ONGLYZA) 5 MG TABS tablet  Take 5 mg by mouth daily.      . Cinnamon 500 MG capsule Take 500 mg by mouth daily.       No current facility-administered medications for this visit.    ROS: Pertinent items are noted in HPI.  Social Hx: married, husband comes with her for visits because her memory is not the best    Exam:    BP 130/62  Ht 5' 3.5" (1.613 m)  Wt 233 lb (105.688 kg)  BMI 40.62 kg/m2  Lost 23 pounds since last year, ht stable Wt Readings from Last 3 Encounters:  12/11/12 233 lb (105.688 kg)  07/03/12 237 lb (107.502 kg)  07/03/12 237 lb (107.502 kg)     Ht Readings from Last 3 Encounters:  12/11/12 5' 3.5" (1.613 m)  07/03/12 5\' 6"  (1.676 m)  07/03/12 5\' 6"  (1.676 m)    General appearance: alert, cooperative and appears stated age Head: Normocephalic, without obvious abnormality, atraumatic Neck: no adenopathy, supple, symmetrical, trachea midline and thyroid not enlarged, symmetric, no tenderness/mass/nodules Lungs:  clear to auscultation bilaterally Breasts: Inspection negative, No nipple retraction or dimpling, No nipple discharge or bleeding, No axillary or supraclavicular adenopathy, Normal to palpation without dominant masses Heart: regular rate and rhythm Abdomen: soft, non-tender; bowel sounds normal; no masses,  no organomegaly Extremities: extremities normal, atraumatic, no cyanosis or edema Skin: Skin color, texture, turgor normal. No rashes or lesions Lymph nodes: Cervical, supraclavicular, and axillary nodes normal. No abnormal inguinal nodes palpated Neurologic: Grossly normal   Pelvic: External genitalia:  no lesions              Urethra:  normal appearing urethra with no masses, tenderness or lesions              Bartholins and Skenes: normal                 Vagina: normal appearing vagina with normal color and discharge, no lesions, Gr 1-2 cystocele              Cervix:absent              Pap taken: no        Bimanual Exam:  Uterus: absemt                                      Adnexa: normal adnexa in size, nontender and no masses                                      Rectovaginal: Confirms                                      Anus:  normal sphincter tone, no lesions  A: normal menopausal exam     S/p TVH, prolapse repair (with Dr. McDiarmid)     P: mammogram counseled on breast self exam, mammography screening, adequate intake of calcium and vitamin D, diet and exercise return annually or prn     An After Visit Summary was printed and given to the patient.

## 2013-03-25 ENCOUNTER — Other Ambulatory Visit: Payer: Self-pay | Admitting: Pain Medicine

## 2013-03-25 DIAGNOSIS — M545 Low back pain: Secondary | ICD-10-CM

## 2013-03-25 DIAGNOSIS — R200 Anesthesia of skin: Secondary | ICD-10-CM

## 2013-03-26 ENCOUNTER — Ambulatory Visit
Admission: RE | Admit: 2013-03-26 | Discharge: 2013-03-26 | Disposition: A | Discharge status: Home / Self Care | Payer: Medicare Other | Source: Ambulatory Visit | Attending: Pain Medicine | Admitting: Pain Medicine

## 2013-03-26 DIAGNOSIS — R200 Anesthesia of skin: Secondary | ICD-10-CM

## 2013-03-26 DIAGNOSIS — M545 Low back pain: Secondary | ICD-10-CM

## 2013-04-05 ENCOUNTER — Other Ambulatory Visit: Payer: Self-pay | Admitting: Internal Medicine

## 2013-04-05 DIAGNOSIS — N189 Chronic kidney disease, unspecified: Secondary | ICD-10-CM

## 2013-04-08 ENCOUNTER — Ambulatory Visit
Admission: RE | Admit: 2013-04-08 | Discharge: 2013-04-08 | Disposition: A | Discharge status: Home / Self Care | Payer: Medicare Other | Source: Ambulatory Visit | Attending: Internal Medicine | Admitting: Internal Medicine

## 2013-04-08 DIAGNOSIS — N189 Chronic kidney disease, unspecified: Secondary | ICD-10-CM

## 2013-07-23 ENCOUNTER — Other Ambulatory Visit: Payer: Self-pay | Admitting: Internal Medicine

## 2013-07-23 DIAGNOSIS — Z1231 Encounter for screening mammogram for malignant neoplasm of breast: Secondary | ICD-10-CM

## 2013-08-26 ENCOUNTER — Ambulatory Visit
Admission: RE | Admit: 2013-08-26 | Discharge: 2013-08-26 | Disposition: A | Discharge status: Home / Self Care | Payer: Medicare Other | Source: Ambulatory Visit | Attending: Internal Medicine | Admitting: Internal Medicine

## 2013-08-26 DIAGNOSIS — Z1231 Encounter for screening mammogram for malignant neoplasm of breast: Secondary | ICD-10-CM

## 2013-09-10 IMAGING — CT CT HEAD W/O CM
1 of 2 series · 16 of 30 positions shown, 20 images · non-contrast
Comparison: 12/31/2010

CLINICAL DATA: Tremors, dementia

CT HEAD WITHOUT CONTRAST
TECHNIQUE: Contiguous axial images were obtained from the base of
the skull through the vertex without contrast.

[Series 2: head routine 4.8 h37s · axial · 0.46mm/px · z∈[-124,+4]mm · 16 of 30 slices shown, 20 images]
[im 2/30  brain]
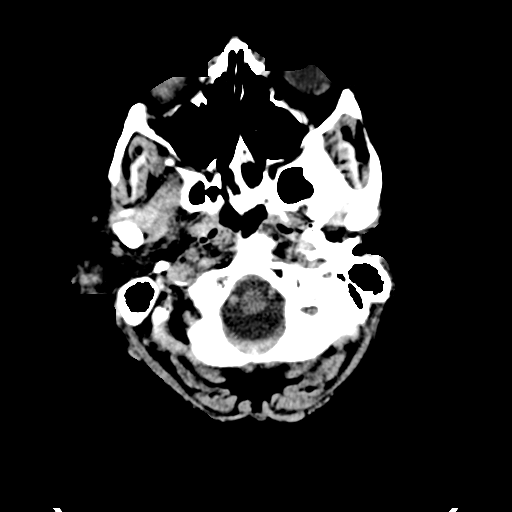
[im 2/30  bone]
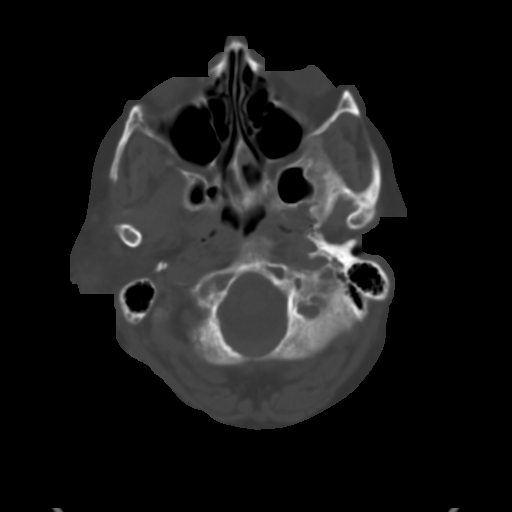
[im 3/30  brain]
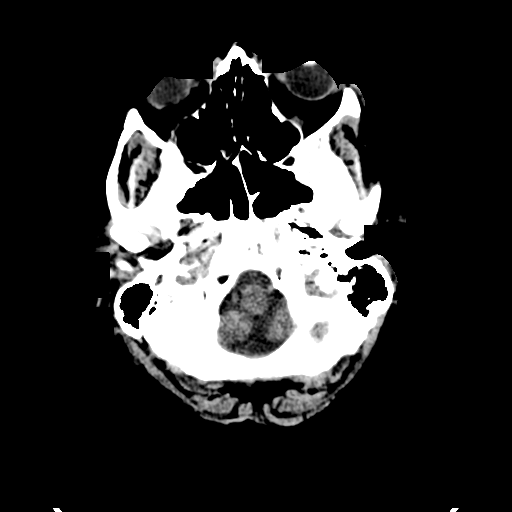
[im 5/30  brain]
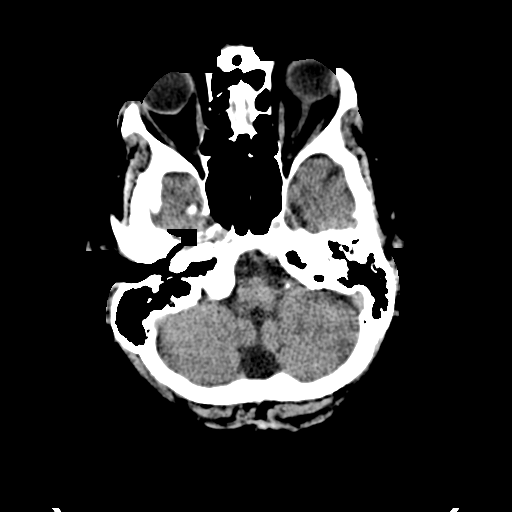
[im 8/30  brain]
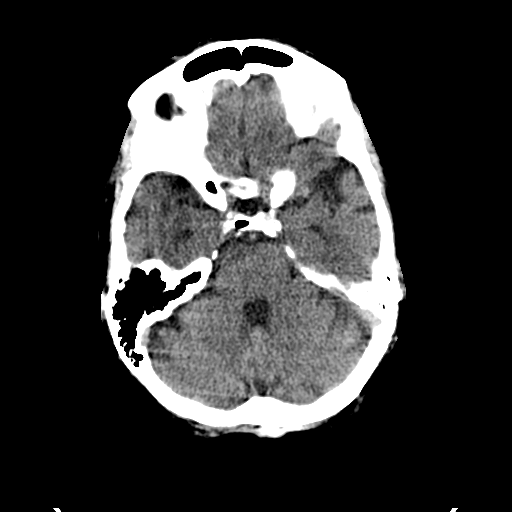
[im 9/30  brain]
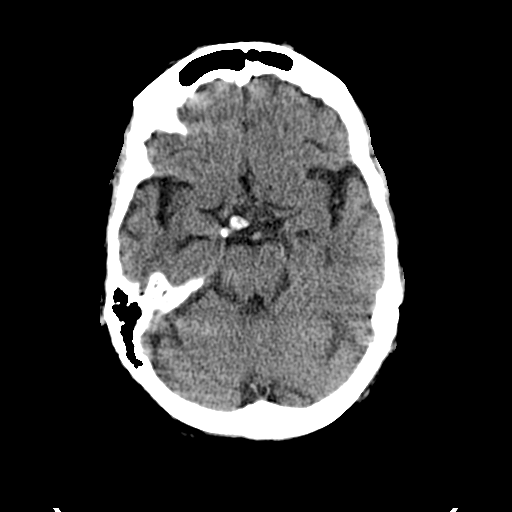
[im 9/30  bone]
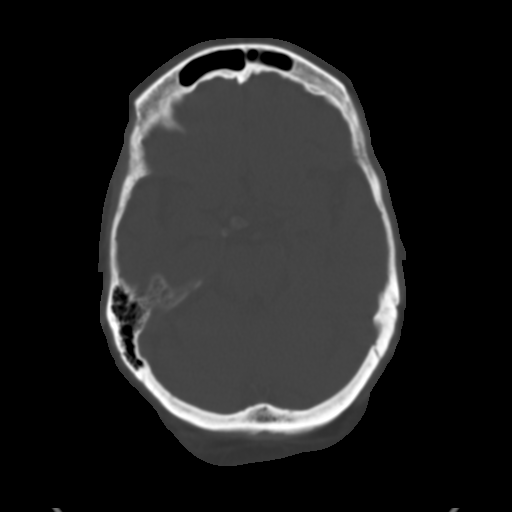
[im 11/30  brain]
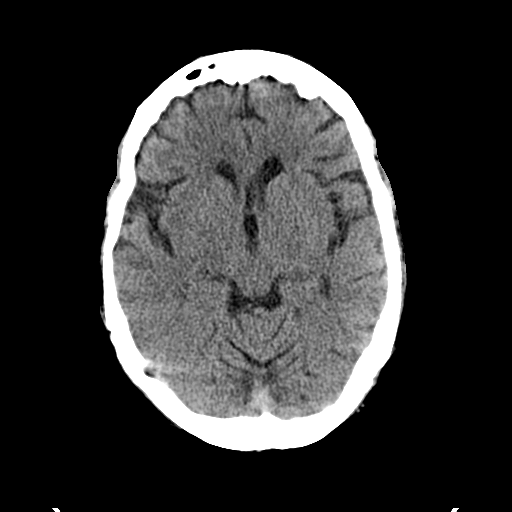
[im 12/30  brain]
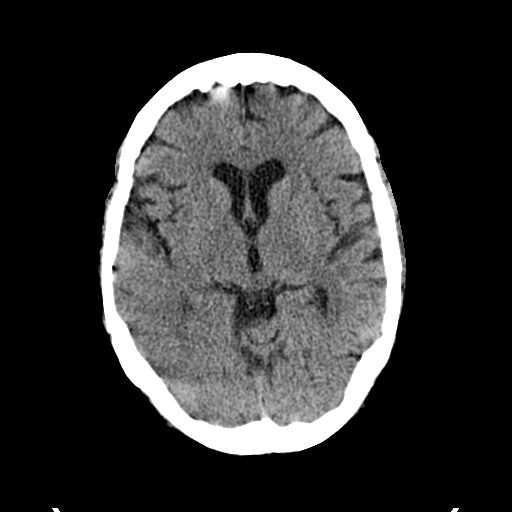
[im 14/30  brain]
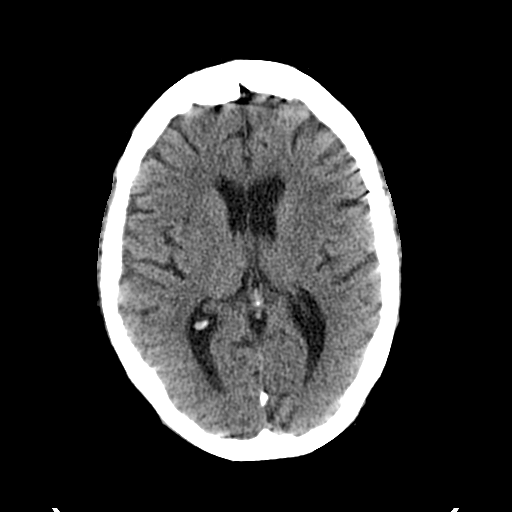
[im 16/30  brain]
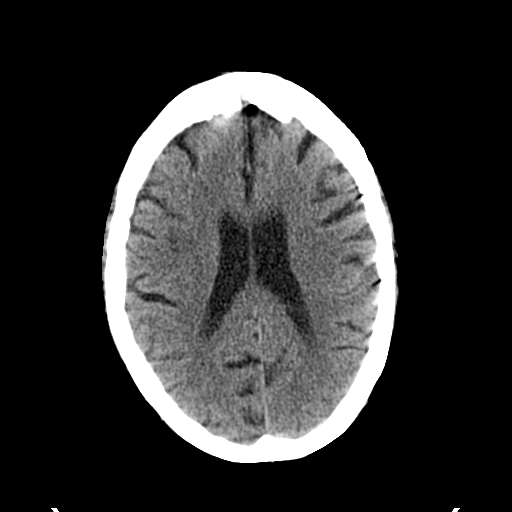
[im 16/30  bone]
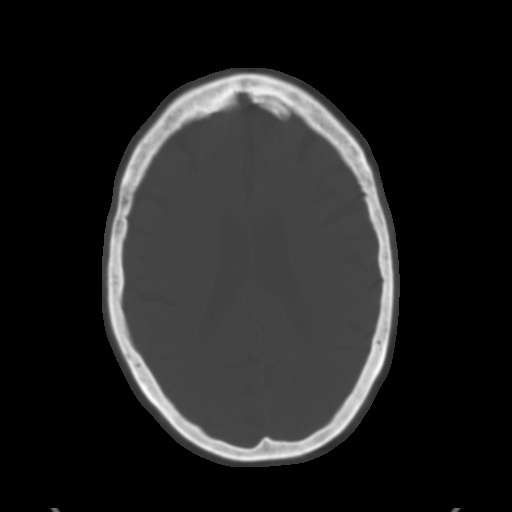
[im 18/30  brain]
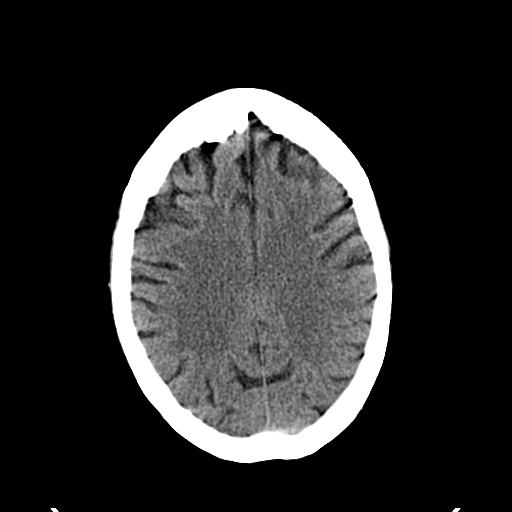
[im 19/30  brain]
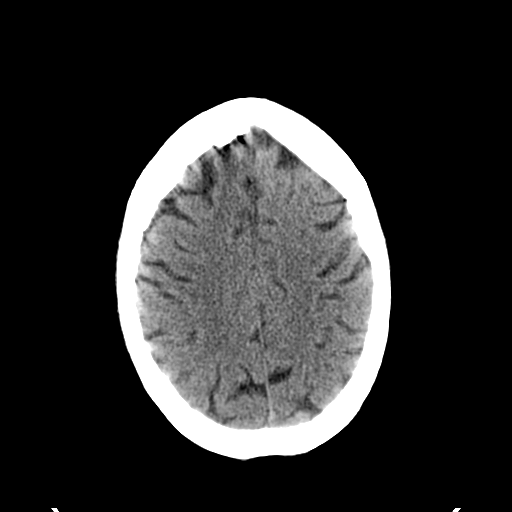
[im 21/30  brain]
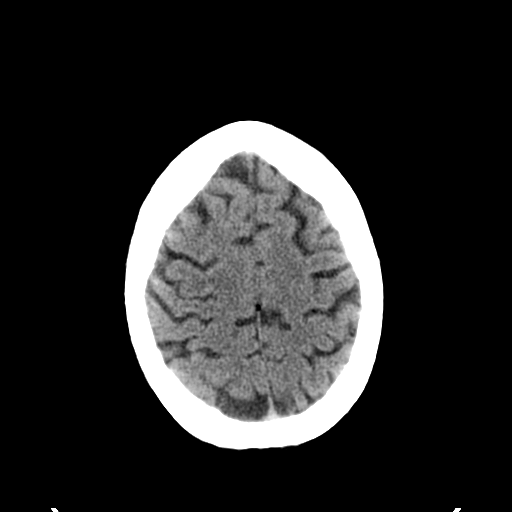
[im 22/30  brain]
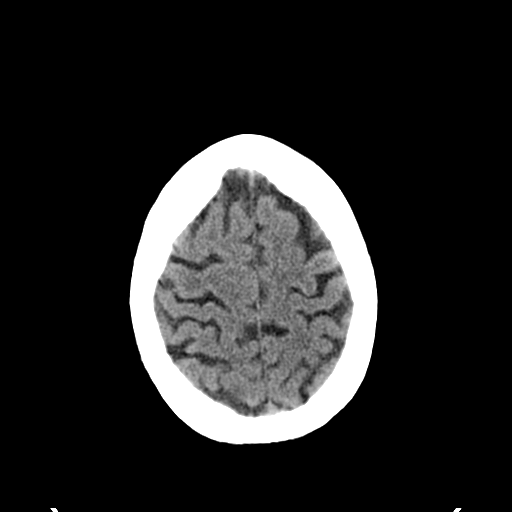
[im 22/30  bone]
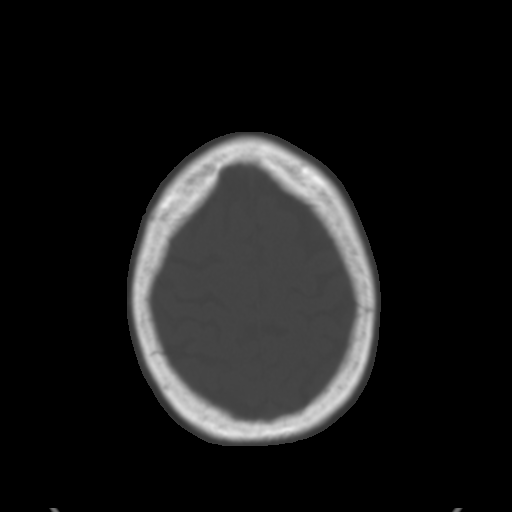
[im 25/30  brain]
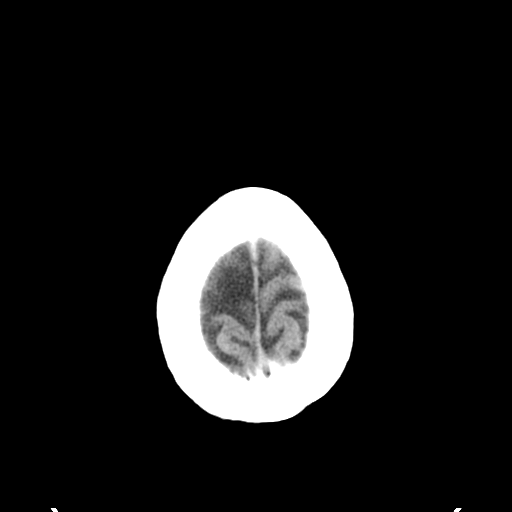
[im 27/30  brain]
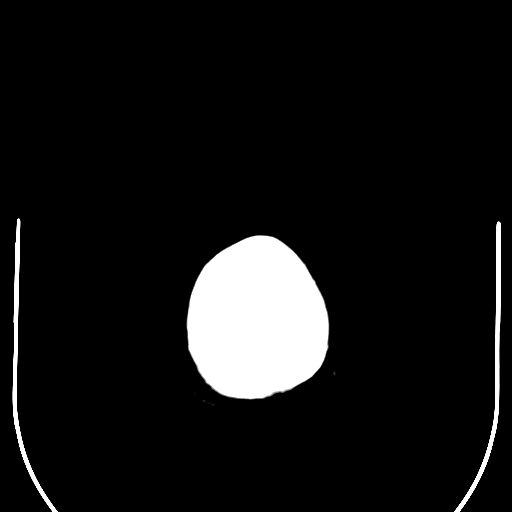
[im 28/30  brain]
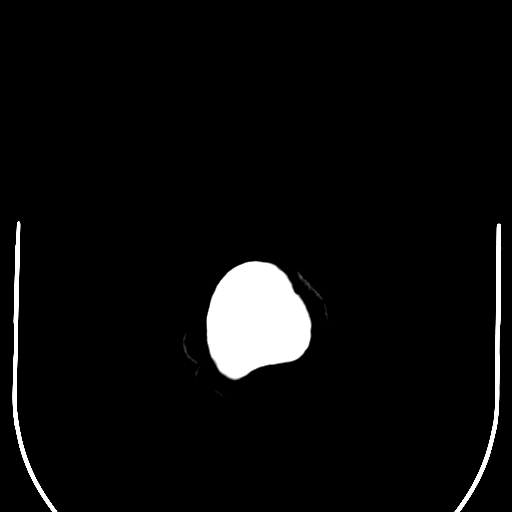

[16 of 30 positions shown; findings below may reference images not displayed]

FINDINGS: No skull fracture is noted.  Paranasal sinuses and
mastoid air cells are unremarkable.

No intracranial hemorrhage, mass effect or midline shift.  Stable
cerebral atrophy.  Stable mild periventricular white matter
decreased attenuation probable due to chronic small vessel ischemic
changes.

No acute infarction.  No mass lesion is noted on this unenhanced
scan.
IMPRESSION: No acute intracranial abnormality.  Stable mild cerebral atrophy.

## 2013-10-29 ENCOUNTER — Ambulatory Visit (INDEPENDENT_AMBULATORY_CARE_PROVIDER_SITE_OTHER): Payer: Medicare Other | Admitting: Neurology

## 2013-10-29 ENCOUNTER — Encounter (INDEPENDENT_AMBULATORY_CARE_PROVIDER_SITE_OTHER): Payer: Self-pay

## 2013-10-29 ENCOUNTER — Encounter: Payer: Self-pay | Admitting: Neurology

## 2013-10-29 ENCOUNTER — Telehealth: Payer: Self-pay | Admitting: Neurology

## 2013-10-29 VITALS — BP 149/70 | HR 68 | Ht 64.3 in | Wt 235.0 lb

## 2013-10-29 DIAGNOSIS — R413 Other amnesia: Secondary | ICD-10-CM

## 2013-10-29 DIAGNOSIS — R519 Headache, unspecified: Secondary | ICD-10-CM | POA: Insufficient documentation

## 2013-10-29 DIAGNOSIS — R51 Headache: Secondary | ICD-10-CM

## 2013-10-29 MED ORDER — DONEPEZIL HCL 23 MG PO TABS: 23.0000 mg | ORAL_TABLET | Freq: Every day | ORAL | Status: DC

## 2013-10-29 MED ORDER — DESVENLAFAXINE SUCCINATE ER 50 MG PO TB24: 50.0000 mg | ORAL_TABLET | Freq: Every day | ORAL | Status: DC

## 2013-10-29 NOTE — Patient Instructions (Signed)
Dementia Dementia is a general term for problems with brain function. A person with dementia has memory loss and a hard time with at least one other brain function such as thinking, speaking, or problem solving. Dementia can affect social functioning, how you do your job, your mood, or your personality. The changes may be hidden for a long time. The earliest forms of this disease are usually not detected by family or friends. Dementia can be:  Irreversible.  Potentially reversible.  Partially reversible.  Progressive. This means it can get worse over time. CAUSES  Irreversible dementia causes may include:  Degeneration of brain cells (Alzheimer's disease or lewy body dementia).  Multiple small strokes (vascular dementia).  Infection (chronic meningitis or Creutzfelt-Jakob disease).  Frontotemporal dementia. This affects younger people, age 40 to 70, compared to those who have Alzheimer's disease.  Dementia associated with other disorders like Parkinson's disease, Huntington's disease, or HIV-associated dementia. Potentially or partially reversible dementia causes may include:  Medicines.  Metabolic causes such as excessive alcohol intake, vitamin B12 deficiency, or thyroid disease.  Masses or pressure in the brain such as a tumor, blood clot, or hydrocephalus. SYMPTOMS  Symptoms are often hard to detect. Family members or coworkers may not notice them early in the disease process. Different people with dementia may have different symptoms. Symptoms can include:  A hard time with memory, especially recent memory. Long-term memory may not be impaired.  Asking the same question multiple times or forgetting something someone just said.  A hard time speaking your thoughts or finding certain words.  A hard time solving problems or performing familiar tasks (such as how to use a telephone).  Sudden changes in mood.  Changes in personality, especially increasing moodiness or  mistrust.  Depression.  A hard time understanding complex ideas that were never a problem in the past. DIAGNOSIS  There are no specific tests for dementia.   Your caregiver may recommend a thorough evaluation. This is because some forms of dementia can be reversible. The evaluation will likely include a physical exam and getting a detailed history from you and a family member. The history often gives the best clues and suggestions for a diagnosis.  Memory testing may be done. A detailed brain function evaluation called neuropsychologic testing may be helpful.  Lab tests and brain imaging (such as a CT scan or MRI scan) are sometimes important.  Sometimes observation and re-evaluation over time is very helpful. TREATMENT  Treatment depends on the cause.   If the problem is a vitamin deficiency, it may be helped or cured with supplements.  For dementias such as Alzheimer's disease, medicines are available to stabilize or slow the course of the disease. There are no cures for this type of dementia.  Your caregiver can help direct you to groups, organizations, and other caregivers to help with decisions in the care of you or your loved one. HOME CARE INSTRUCTIONS The care of individuals with dementia is varied and dependent upon the progression of the dementia. The following suggestions are intended for the person living with, or caring for, the person with dementia.  Create a safe environment.  Remove the locks on bathroom doors to prevent the person from accidentally locking himself or herself in.  Use childproof latches on kitchen cabinets and any place where cleaning supplies, chemicals, or alcohol are kept.  Use childproof covers in unused electrical outlets.  Install childproof devices to keep doors and windows secured.  Remove stove knobs or install safety   knobs and an automatic shut-off on the stove.  Lower the temperature on water heaters.  Label medicines and keep them  locked up.  Secure knives, lighters, matches, power tools, and guns, and keep these items out of reach.  Keep the house free from clutter. Remove rugs or anything that might contribute to a fall.  Remove objects that might break and hurt the person.  Make sure lighting is good, both inside and outside.  Install grab rails as needed.  Use a monitoring device to alert you to falls or other needs for help.  Reduce confusion.  Keep familiar objects and people around.  Use night lights or dim lights at night.  Label items or areas.  Use reminders, notes, or directions for daily activities or tasks.  Keep a simple, consistent routine for waking, meals, bathing, dressing, and bedtime.  Create a calm, quiet environment.  Place large clocks and calendars prominently.  Display emergency numbers and home address near all telephones.  Use cues to establish different times of the day. An example is to open curtains to let the natural light in during the day.   Use effective communication.  Choose simple words and short sentences.  Use a gentle, calm tone of voice.  Be careful not to interrupt.  If the person is struggling to find a word or communicate a thought, try to provide the word or thought.  Ask one question at a time. Allow the person ample time to answer questions. Repeat the question again if the person does not respond.  Reduce nighttime restlessness.  Provide a comfortable bed.  Have a consistent nighttime routine.  Ensure a regular walking or physical activity schedule. Involve the person in daily activities as much as possible.  Limit napping during the day.  Limit caffeine.  Attend social events that stimulate rather than overwhelm the senses.  Encourage good nutrition and hydration.  Reduce distractions during meal times and snacks.  Avoid foods that are too hot or too cold.  Monitor chewing and swallowing ability.  Continue with routine vision,  hearing, dental, and medical screenings.  Only give over-the-counter or prescription medicines as directed by the caregiver.  Monitor driving abilities. Do not allow the person to drive when safe driving is no longer possible.  Register with an identification program which could provide location assistance in the event of a missing person situation. SEEK MEDICAL CARE IF:   New behavioral problems start such as moodiness, aggressiveness, or seeing things that are not there (hallucinations).  Any new problem with brain function happens. This includes problems with balance, speech, or falling a lot.  Problems with swallowing develop.  Any symptoms of other illness happen. Small changes or worsening in any aspect of brain function can be a sign that the illness is getting worse. It can also be a sign of another medical illness such as infection. Seeing a caregiver right away is important. SEEK IMMEDIATE MEDICAL CARE IF:   A fever develops.  New or worsened confusion develops.  New or worsened sleepiness develops.  Staying awake becomes hard to do. Document Released: 12/28/2000 Document Revised: 09/26/2011 Document Reviewed: 11/29/2010 [employer] Patient Information 2014 [employer].  

## 2013-10-29 NOTE — Progress Notes (Signed)
Reason for visit: Memory disturbance  Kiara Nelson is a 80 y.o. female  History of present illness:  Kiara Nelson is a 80 year old white female with a history of a progressive memory disturbance that was first noted in 2010. 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 The husband noted that handling the functions as the executor was difficult for her, and she required some assistance. As time has gone on, the patient began having increasing problems with memory, and she was placed on Aricept approximately 2-1/2 years ago. The patient has tolerated the medication fairly well. Medication such as Namenda was not tolerated as this resulted in increased agitation. The patient has been felt to have some issues with depression, and she has been placed on Prozac. Gradually, the patient has had increasing problems with social withdrawal, not wishing to be active during the day. The patient will still be involved a certain church events, and she seems to enjoy this. The patient has begun having some headaches on the top of her head and in the bifrontal areas of the last several months. The patient reports no focal numbness or weakness of the face, arms, or legs. The patient has some slight imbalance problems, no falls. The patient has not had any problems controlling the bowels or the bladder. The patient is sleeping relatively well, but she will occasionally fall in her sleep. The patient does have chronic low back pain, and some discomfort into the left heel associated with numbness as well. The patient requires assistance with doing the finances, and she had to give this activity up approximately 2 years ago. The patient does not cook as much. The patient is sent to this office as well for an evaluation. A CT scan of the brain done in October 2013 was relatively unremarkable, showing some atrophy.  Past Medical History  Diagnosis Date  . Hypertension   .  Diabetes mellitus without complication   . Dementia   . Back pain, chronic     lumbar up to thoracic area  . Anxiety   . Recurrent cystitis   . Arthritis   . Hypercholesteremia   . Cancer     Breast cancer  . Complex endometrial hyperplasia 2004    renal insufficiency  . Headache(784.0) 10/29/2013  . Obese     Past Surgical History  Procedure Laterality Date  . Breast surgery    . Foot neuroma surgery    . Vaginal hysterectomy  07/03/2012    Procedure: HYSTERECTOMY VAGINAL;  Surgeon: Peri Maris, MD;  Location: [facility];  Service: Gynecology;  Laterality: N/A;  . Examination under anesthesia  07/03/2012    Procedure: EXAM UNDER ANESTHESIA;  Surgeon: Reece Packer, MD;  Location: [facility];  Service: Urology;  Laterality: N/A;  . Cystoscopy  07/03/2012    Procedure: CYSTOSCOPY;  Surgeon: Reece Packer, MD;  Location: [facility];  Service: Urology;;  . Tubal ligation  1970  . Dilation and curettage of uterus  2004    polyp resection  . Tonsillectomy  1962  . Cataract extraction, bilateral  09/2012    Family History  Problem Relation Age of Onset  . Hypertension Mother   . Heart disease Mother   . Hypertension Father   . Dementia Father   . Depression Sister     Social history:  reports that she has never smoked. She has never used smokeless tobacco. She reports that she drinks  alcohol. She reports that she does not use illicit drugs.  Medications:  Current Outpatient Prescriptions on File Prior to Visit  Medication Sig Dispense Refill  . ALPRAZolam (XANAX) 0.25 MG tablet Take 1 tablet (0.25 mg total) by mouth 3 (three) times daily as needed for anxiety.  15 tablet  0  . Calcium Carb-Cholecalciferol (CALCIUM-VITAMIN D) 600-400 MG-UNIT TABS Take 2 tablets by mouth daily.      . Cholecalciferol (VITAMIN D3) 1000 UNITS CAPS Take by mouth daily.      . Cinnamon 500 MG capsule Take 500 mg by mouth daily.      . fenofibrate (TRICOR) 48 MG tablet Take 48 mg by mouth  daily.      Marland Kitchen FLUoxetine (PROZAC) 40 MG capsule Take 40 mg by mouth daily.      Marland Kitchen losartan (COZAAR) 50 MG tablet Take 50 mg by mouth daily.      . Multiple Vitamins-Minerals (MULTIVITAMIN WITH MINERALS) tablet Take 1 tablet by mouth daily.      . Omega-3 Fatty Acids (FISH OIL) 1000 MG CAPS Take 1 capsule by mouth 2 (two) times daily.      . Pyridoxine HCl (VITAMIN B6 PO) Take by mouth daily.      . saxagliptin HCl (ONGLYZA) 5 MG TABS tablet Take 5 mg by mouth daily.       No current facility-administered medications on file prior to visit.      Allergies  Allergen Reactions  . Bee Venom Swelling  . Penicillins Swelling    Swelling of hands  . Sulfa Antibiotics     ROS:  Out of a complete 14 system review of symptoms, the patient complains only of the following symptoms, and all other reviewed systems are negative.  Ringing in the ears Memory loss, confusion, headache Depression, anxiety, cannot sleep, decreased energy, change in appetite, hallucinations Restless legs    Blood pressure 149/70, pulse 68, height 5' 4.3" (1.633 m), weight 235 lb (106.595 kg).  Physical Exam  General: The patient is alert and cooperative at the time of the examination. The patient is moderately obese.  Eyes: Pupils are equal, round, and reactive to light. Discs are flat bilaterally.  Neck: The neck is supple, no carotid bruits are noted.  Respiratory: The respiratory examination is clear.  Cardiovascular: The cardiovascular examination reveals a regular rate and rhythm, no obvious murmurs or rubs are noted.  Skin: Extremities are without significant edema.  Neurologic Exam  Mental status: The Mini-Mental status examination done today shows a total score of 21/30.  Cranial nerves: Facial symmetry is present. There is good sensation of the face to pinprick and soft touch bilaterally. The strength of the facial muscles and the muscles to head turning and shoulder shrug are normal  bilaterally. Speech is well enunciated, no aphasia or dysarthria is noted. Extraocular movements are full. Visual fields are full. The tongue is midline, and the patient has symmetric elevation of the soft palate. No obvious hearing deficits are noted.  Motor: The motor testing reveals 5 over 5 strength of all 4 extremities. Good symmetric motor tone is noted throughout.  Sensory: Sensory testing is intact to pinprick, soft touch, vibration sensation, and position sense on all 4 extremities. No evidence of extinction is noted.  Coordination: Cerebellar testing reveals good finger-nose-finger and heel-to-shin bilaterally.  Gait and station: Gait is normal. Tandem gait is normal. Romberg is negative. No drift is seen.  Reflexes: Deep tendon reflexes are symmetric and normal bilaterally. Toes are  downgoing bilaterally.   Assessment/Plan:  1. Progressive memory disturbance, dementia  2. History of depression and anxiety  The family is concerned that the patient is suffering from significant depression. The patient seems to be fairly animated during the examination, and I do not think that severe organic depression is an issue. The patient is having new onset headaches. For this reason, blood work will be done, and a repeat CT scan of the head will be done. The patient will followup through this office in about 6-8 months. The Aricept dose will be increased to a 23 mg tablet, if she is not able to tolerate this, the family will contact me. It appears that the husband is not aware of the various stages of dementia. Social withdrawal is likely related to the dementia, not to depression. The family is concerned that the Prozac is not helping her, we will switch her to [place].  Jill Alexanders MD 10/29/2013 8:12 PM  [facility] [address] [place], [place] [address]  Phone [phone] Fax [phone]

## 2013-10-29 NOTE — Telephone Encounter (Signed)
Patient's daughter at check out today states she would like to be called with patient's blood work results.

## 2013-10-30 LAB — SEDIMENTATION RATE: SED RATE: 16 mm/h (ref 0–40)

## 2013-10-30 LAB — VITAMIN B12: VITAMIN B 12: 603 pg/mL (ref 211–946)

## 2013-10-30 LAB — RPR: SYPHILIS RPR SCR: NONREACTIVE

## 2013-10-30 NOTE — Progress Notes (Signed)
Quick Note:  Shared unremarkable labs with patient thru VM message, she verbalized understanding ______

## 2013-11-03 ENCOUNTER — Ambulatory Visit
Admission: RE | Admit: 2013-11-03 | Discharge: 2013-11-03 | Disposition: A | Discharge status: Home / Self Care | Payer: Medicare Other | Source: Ambulatory Visit | Attending: Neurology | Admitting: Neurology

## 2013-11-03 DIAGNOSIS — R413 Other amnesia: Secondary | ICD-10-CM

## 2013-11-03 DIAGNOSIS — R51 Headache: Secondary | ICD-10-CM

## 2013-11-04 ENCOUNTER — Telehealth: Payer: Self-pay | Admitting: Neurology

## 2013-11-04 NOTE — Telephone Encounter (Signed)
I called the patient and I discussed the MRI results. The patient does have some mild small vessel disease, no acute changes seen. The patient will go on higher dose Aricept, taking 23 mg daily.    MRI brain 11/04/2013:  Impression   Abnormal MRI scan of the brain showing mild changes of chronic  microvascular ischemia and generalized cerebral atrophy.

## 2013-11-11 ENCOUNTER — Telehealth: Payer: Self-pay | Admitting: Neurology

## 2013-11-11 NOTE — Telephone Encounter (Signed)
Pt's husband called back and I explained to him per Dr. Jannifer Franklin phone note on 11/04/13, for the pt to take a higher dose of the Aricept 23 mg. I advised the husband the if the pt has any other problems, questions or concerns to call the office. Pt's husband verbalized understanding.

## 2013-11-11 NOTE — Telephone Encounter (Signed)
Called pt's husband Gwyndolyn Saxon, but he could not come to the phone and will call back.

## 2013-11-11 NOTE — Telephone Encounter (Signed)
Patient's husband calling regarding a call received from Dr. Jannifer Franklin on 4-20 about MRI results--patient cannot remember conversation--please call after 3:30--thank you.

## 2013-11-13 ENCOUNTER — Ambulatory Visit: Payer: Medicare Other | Admitting: Internal Medicine

## 2013-11-20 ENCOUNTER — Telehealth: Payer: Self-pay | Admitting: *Deleted

## 2013-11-20 NOTE — Telephone Encounter (Signed)
Patients daughter calling and requesting a return call.  Father has given pt desvenlafaxine (PRISTIQ) 50 MG 24 hr tablet in am and FLUoxetine (PROZAC) 40 MG capsule in pm.  She understood Dr Jannifer Franklin wanted to discontinue Prozac, if that was the case how long with Prozac stay in Mothers system.  She's also requesting PCP, Dr Maudie Mercury receive results from MRI.

## 2013-12-12 ENCOUNTER — Telehealth: Payer: Self-pay | Admitting: Neurology

## 2013-12-12 MED ORDER — DESVENLAFAXINE SUCCINATE ER 100 MG PO TB24: 100.0000 mg | ORAL_TABLET | Freq: Every day | ORAL | Status: DC

## 2013-12-12 NOTE — Telephone Encounter (Signed)
Spouse called to inform Dr Jannifer Franklin, desvenlafaxine (PRISTIQ) 50 MG 24 hr tablet isn't working. Patient wants to sleep all day and getting very depressed. Questioning if needing to increase dosage. Please call and advise

## 2013-12-12 NOTE — Telephone Encounter (Signed)
Spouse called to inform Dr Jannifer Franklin,  desvenlafaxine (PRISTIQ) 50 MG 24 hr tablet isn't working.  Patient wants to sleep all day and getting very depressed.  Questioning if needing to increase dosage.  Please call and advise

## 2013-12-12 NOTE — Telephone Encounter (Signed)
I called patient. I talked with the husband. The patient has been on Prozac previously, now on Pristiq at 50 mg daily. The patient still having significant depression, we will increase to the 100 mg tablet.

## 2013-12-16 ENCOUNTER — Telehealth: Payer: Self-pay | Admitting: Obstetrics and Gynecology

## 2013-12-16 NOTE — Telephone Encounter (Signed)
Confirming pts appt °

## 2013-12-20 ENCOUNTER — Telehealth: Payer: Self-pay | Admitting: Obstetrics and Gynecology

## 2013-12-20 ENCOUNTER — Ambulatory Visit: Payer: Medicare Other | Admitting: Obstetrics and Gynecology

## 2013-12-20 NOTE — Telephone Encounter (Addendum)
Pt's husband called to cancel her appointment for today. She has been up all night with diarrhea and is in no shape to come in today. Pt will call back to reschedule annual.

## 2014-02-26 ENCOUNTER — Ambulatory Visit (INDEPENDENT_AMBULATORY_CARE_PROVIDER_SITE_OTHER): Payer: Medicare Other | Admitting: Obstetrics and Gynecology

## 2014-02-26 ENCOUNTER — Encounter: Payer: Self-pay | Admitting: Obstetrics and Gynecology

## 2014-02-26 VITALS — BP 138/60 | HR 68 | Ht 63.5 in | Wt 238.0 lb

## 2014-02-26 DIAGNOSIS — N8111 Cystocele, midline: Secondary | ICD-10-CM

## 2014-02-26 DIAGNOSIS — N3946 Mixed incontinence: Secondary | ICD-10-CM

## 2014-02-26 DIAGNOSIS — Z01419 Encounter for gynecological examination (general) (routine) without abnormal findings: Secondary | ICD-10-CM

## 2014-02-26 DIAGNOSIS — IMO0002 Reserved for concepts with insufficient information to code with codable children: Secondary | ICD-10-CM

## 2014-02-26 NOTE — Patient Instructions (Signed)

## 2014-02-26 NOTE — Progress Notes (Signed)
GYNECOLOGY VISIT  PCP: Jani Gravel  Referring provider:   HPI: 81 y.o.   Married  Caucasian  female   G3P3 with No LMP recorded. Patient is postmenopausal.   here for   Annual  Status post [facility] 07/03/12 for pelvic organ proalpse.  No cystocele repair done by Dr. Matilde Sprang at the time of surgery.   Wears a pad all the time.  Is leaking with cough and sneeze.  No leak for no reason.  Can have urge and leak related to that.  No painful urination, burning, or blood.  Uncertain if she had urodynamic testing.  No prior medication use.   States her memory is not so good.  Uncertain if she has had a hysterectomy or not.   Hgb:  PCP Urine:  PCP  GYNECOLOGIC HISTORY: No LMP recorded. Patient is postmenopausal. Sexually active:  yes Partner preference: female Contraception:   postmenopausal Menopausal hormone therapy: no DES exposure:   no Blood transfusions:no    Sexually transmitted diseases:no    GYN procedures and prior surgeries:Hysterectomy-2013, Tubal Ligation-1970, C&D - 2004   Last mammogram:   08/2013 BI-RADS neg              Last pap and high risk HPV testing:   10/27/11 Neg History of abnormal pap smear:  no   OB History   Grav Para Term Preterm Abortions TAB SAB Ect Mult Living   3 3        3        LIFESTYLE: Exercise: yes, Stationary Bikes , paddle boat         Tobacco: no Alcohol:no Drug use:no    OTHER HEALTH MAINTENANCE: Tetanus/TDap:not sure Gardisil:no Influenza:  yes Zostavax: not sure  Bone density:05/14/08 normal, repeat in 5 yrs Colonoscopy: 09/18/11 normal, repeat in 10 years  Cholesterol check: PCP  Family History  Problem Relation Age of Onset  . Hypertension Mother   . Heart disease Mother   . Hypertension Father   . Dementia Father   . Depression Sister     Patient Active Problem List   Diagnosis Date Noted  . Memory disorder 10/29/2013  . Headache(784.0) 10/29/2013  . Dementia    Past Medical History  Diagnosis Date  .  Hypertension   . Diabetes mellitus without complication   . Dementia   . Back pain, chronic     lumbar up to thoracic area  . Anxiety   . Recurrent cystitis   . Arthritis   . Hypercholesteremia   . Cancer     Breast cancer  . Complex endometrial hyperplasia 2004    renal insufficiency  . Headache(784.0) 10/29/2013  . Obese     Past Surgical History  Procedure Laterality Date  . Breast surgery    . Foot neuroma surgery    . Vaginal hysterectomy  07/03/2012    Procedure: HYSTERECTOMY VAGINAL;  Surgeon: Peri Maris, MD;  Location: [facility];  Service: Gynecology;  Laterality: N/A;  . Examination under anesthesia  07/03/2012    Procedure: EXAM UNDER ANESTHESIA;  Surgeon: Reece Packer, MD;  Location: [facility];  Service: Urology;  Laterality: N/A;  . Cystoscopy  07/03/2012    Procedure: CYSTOSCOPY;  Surgeon: Reece Packer, MD;  Location: [facility];  Service: Urology;;  . Tubal ligation  1970  . Dilation and curettage of uterus  2004    polyp resection  . Tonsillectomy  1962  . Cataract extraction, bilateral  09/2012    ALLERGIES: Bee venom;  Penicillins; and Sulfa antibiotics  Current Outpatient Prescriptions  Medication Sig Dispense Refill  . ALPRAZolam (XANAX) 0.25 MG tablet Take 1 tablet (0.25 mg total) by mouth 3 (three) times daily as needed for anxiety.  15 tablet  0  . Calcium Carb-Cholecalciferol (CALCIUM-VITAMIN D) 600-400 MG-UNIT TABS Take 2 tablets by mouth daily.      . Cholecalciferol (VITAMIN D3) 1000 UNITS CAPS Take by mouth daily.      . Cinnamon 500 MG capsule Take 500 mg by mouth daily.      Marland Kitchen desvenlafaxine (PRISTIQ) 100 MG 24 hr tablet Take 1 tablet (100 mg total) by mouth daily.  30 tablet  3  . donepezil (ARICEPT) 23 MG TABS tablet Take 1 tablet (23 mg total) by mouth at bedtime.  30 tablet  5  . fenofibrate (TRICOR) 48 MG tablet Take 48 mg by mouth daily.      Marland Kitchen levothyroxine (SYNTHROID, LEVOTHROID) 25 MCG tablet Take 25 mcg by mouth daily.      Marland Kitchen  losartan (COZAAR) 50 MG tablet Take 50 mg by mouth daily.      . Multiple Vitamins-Minerals (MULTIVITAMIN WITH MINERALS) tablet Take 1 tablet by mouth daily.      . Omega-3 Fatty Acids (FISH OIL) 1000 MG CAPS Take 1 capsule by mouth 2 (two) times daily.      . Pyridoxine HCl (VITAMIN B6 PO) Take by mouth daily.      . saxagliptin HCl (ONGLYZA) 5 MG TABS tablet Take 5 mg by mouth daily.       No current facility-administered medications for this visit.     ROS:  Pertinent items are noted in HPI.  History   Social History  . Marital Status: Married    Spouse Name: N/A    Number of Children: 3  . Years of Education: college   Occupational History  . Retired    Social History Main Topics  . Smoking status: Never Smoker   . Smokeless tobacco: Never Used  . Alcohol Use: 0.0 oz/week    0 drink(s) per week     Comment: rarely  . Drug Use: No  . Sexual Activity: Yes    Partners: Male    Birth Control/ Protection: Post-menopausal, Surgical     Comment: [facility]   Other Topics Concern  . Not on file   Social History Narrative  . No narrative on file    PHYSICAL EXAMINATION:    BP 138/60  Pulse 68  Ht 5' 3.5" (1.613 m)  Wt 238 lb (107.956 kg)  BMI 41.49 kg/m2   Wt Readings from Last 3 Encounters:  02/26/14 238 lb (107.956 kg)  10/29/13 235 lb (106.595 kg)  12/11/12 233 lb (105.688 kg)     Ht Readings from Last 3 Encounters:  02/26/14 5' 3.5" (1.613 m)  10/29/13 5' 4.3" (1.633 m)  12/11/12 5' 3.5" (1.613 m)    General appearance: alert, cooperative and appears stated age Head: Normocephalic, without obvious abnormality, atraumatic Neck: no adenopathy, supple, symmetrical, trachea midline and thyroid not enlarged, symmetric, no tenderness/mass/nodules Lungs: clear to auscultation bilaterally Breasts: Left breast with inferior scar and induration of surrounding tissue, No nipple retraction or dimpling, No nipple discharge or bleeding, No axillary or supraclavicular  adenopathy, Normal to palpation without dominant masses Heart: regular rate and rhythm Abdomen: obese, soft, non-tender; no masses,  no organomegaly Extremities: extremities normal, atraumatic, no cyanosis or edema Skin: Skin color, texture, turgor normal. No rashes or lesions Lymph nodes: Cervical,  supraclavicular, and axillary nodes normal. No abnormal inguinal nodes palpated Neurologic: Grossly normal  Pelvic: External genitalia:  no lesions              Urethra:  normal appearing urethra with no masses, tenderness or lesions              Bartholins and Skenes: normal                 Vagina: normal appearing vagina with normal color and discharge, no lesions. Second degree cystocele.  Tight introitus and pubic arch.               Cervix: normal appearance              Pap and high risk HPV testing done: No..            Bimanual Exam:  Uterus:  uterus is normal size, shape, consistency and nontender                                      Adnexa: normal adnexa in size, nontender and no masses                                      Rectovaginal: yes                                      Confirms above.                                      Anus:  normal sphincter tone, no lesions  ASSESSMENT  History of left breast cancer.  Recurrent prolapse.  Mixed incontinence by history.  Dementia.   PLAN  Mammogram recommended yearly starting at age 34. Pap smear and high risk HPV testing as above. Counseled on self breast exam, Calcium and vitamin D intake, exercise. See lab orders no I invited patient's husband into the exam room after her exam was completed so we could discuss options for further care for the patient for her prolapse and incontinence.  I discussed pessary fitting.  I discussed referral back to urology for potential physical therapy, medical, or surgical care.  They choose to return to Dr. Matilde Sprang, so referral order placed.  Return annually or prn   An After Visit Summary  was printed and given to the patient.

## 2014-03-07 NOTE — Telephone Encounter (Signed)
Noted  

## 2014-04-23 ENCOUNTER — Other Ambulatory Visit: Payer: Self-pay | Admitting: Neurology

## 2014-04-30 ENCOUNTER — Encounter: Payer: Self-pay | Admitting: Neurology

## 2014-04-30 ENCOUNTER — Ambulatory Visit (INDEPENDENT_AMBULATORY_CARE_PROVIDER_SITE_OTHER): Payer: Medicare Other | Admitting: Neurology

## 2014-04-30 VITALS — BP 125/68 | HR 54 | Ht 65.0 in | Wt 234.8 lb

## 2014-04-30 DIAGNOSIS — G8929 Other chronic pain: Secondary | ICD-10-CM

## 2014-04-30 DIAGNOSIS — G44229 Chronic tension-type headache, not intractable: Secondary | ICD-10-CM

## 2014-04-30 DIAGNOSIS — M545 Low back pain, unspecified: Secondary | ICD-10-CM

## 2014-04-30 DIAGNOSIS — F329 Major depressive disorder, single episode, unspecified: Secondary | ICD-10-CM

## 2014-04-30 DIAGNOSIS — R413 Other amnesia: Secondary | ICD-10-CM

## 2014-04-30 DIAGNOSIS — F32A Depression, unspecified: Secondary | ICD-10-CM

## 2014-04-30 HISTORY — DX: Depression, unspecified: F32.A

## 2014-04-30 HISTORY — DX: Low back pain, unspecified: M54.50

## 2014-04-30 MED ORDER — DONEPEZIL HCL 10 MG PO TABS: 10.0000 mg | ORAL_TABLET | Freq: Every day | ORAL | Status: DC

## 2014-04-30 MED ORDER — DESVENLAFAXINE SUCCINATE ER 100 MG PO TB24: 100.0000 mg | ORAL_TABLET | Freq: Every day | ORAL | Status: DC

## 2014-04-30 NOTE — Patient Instructions (Signed)
Back Pain, Adult Low back pain is very common. About 1 in 5 people have back pain.The cause of low back pain is rarely dangerous. The pain often gets better over time.About half of people with a sudden onset of back pain feel better in just 2 weeks. About 8 in 10 people feel better by 6 weeks.  CAUSES Some common causes of back pain include:  Strain of the muscles or ligaments supporting the spine.  Wear and tear (degeneration) of the spinal discs.  Arthritis.  Direct injury to the back. DIAGNOSIS Most of the time, the direct cause of low back pain is not known.However, back pain can be treated effectively even when the exact cause of the pain is unknown.Answering your caregiver's questions about your overall health and symptoms is one of the most accurate ways to make sure the cause of your pain is not dangerous. If your caregiver needs more information, he or she may order lab work or imaging tests (X-rays or MRIs).However, even if imaging tests show changes in your back, this usually does not require surgery. HOME CARE INSTRUCTIONS For many people, back pain returns.Since low back pain is rarely dangerous, it is often a condition that people can learn to manageon their own.   Remain active. It is stressful on the back to sit or stand in one place. Do not sit, drive, or stand in one place for more than 30 minutes at a time. Take short walks on level surfaces as soon as pain allows.Try to increase the length of time you walk each day.  Do not stay in bed.Resting more than 1 or 2 days can delay your recovery.  Do not avoid exercise or work.Your body is made to move.It is not dangerous to be active, even though your back may hurt.Your back will likely heal faster if you return to being active before your pain is gone.  Pay attention to your body when you bend and lift. Many people have less discomfortwhen lifting if they bend their knees, keep the load close to their bodies,and  avoid twisting. Often, the most comfortable positions are those that put less stress on your recovering back.  Find a comfortable position to sleep. Use a firm mattress and lie on your side with your knees slightly bent. If you lie on your back, put a pillow under your knees.  Only take over-the-counter or prescription medicines as directed by your caregiver. Over-the-counter medicines to reduce pain and inflammation are often the most helpful.Your caregiver may prescribe muscle relaxant drugs.These medicines help dull your pain so you can more quickly return to your normal activities and healthy exercise.  Put ice on the injured area.  Put ice in a plastic bag.  Place a towel between your skin and the bag.  Leave the ice on for 15-20 minutes, 03-04 times a day for the first 2 to 3 days. After that, ice and heat may be alternated to reduce pain and spasms.  Ask your caregiver about trying back exercises and gentle massage. This may be of some benefit.  Avoid feeling anxious or stressed.Stress increases muscle tension and can worsen back pain.It is important to recognize when you are anxious or stressed and learn ways to manage it.Exercise is a great option. SEEK MEDICAL CARE IF:  You have pain that is not relieved with rest or medicine.  You have pain that does not improve in 1 week.  You have new symptoms.  You are generally not feeling well. SEEK   IMMEDIATE MEDICAL CARE IF:   You have pain that radiates from your back into your legs.  You develop new bowel or bladder control problems.  You have unusual weakness or numbness in your arms or legs.  You develop nausea or vomiting.  You develop abdominal pain.  You feel faint. Document Released: 07/04/2005 Document Revised: 01/03/2012 Document Reviewed: 11/05/2013 Exit[employer] Patient Information 2015 [employer]. This information is not intended to replace advice given to you by your health care provider. Make sure you  discuss any questions you have with your health care provider.  

## 2014-04-30 NOTE — Progress Notes (Signed)
Reason for visit: Memory disorder  Kiara Nelson is an 80 y.o. female  History of present illness:  Kiara Nelson is a 80 year old white female with a history of a progressive memory disorder. The patient has been placed on Aricept 23 mg, but she was not able tolerate the drug. The patient is breaking the tablet in half and taking one half tablet daily. The patient has had some issues with low back pain, and some numbness down the left leg. She has had multiple injections in the back, and she is considering surgery. The patient has had some benefit with the Pristiq for the depression issues, currently on 100 mg daily. She is tolerating this medication well. She has not had any other significant medical issues that have come up since last seen. The husband believes that her short-term memory is getting worse over time, she is repeating herself more frequently. The patient does not operate a motor vehicle. The patient continues to have one or 2 headaches a week. Prior sedimentation rate was unremarkable. MRI of the brain done previously does not show significant intracranial abnormalities.  Past Medical History  Diagnosis Date  . Hypertension   . Diabetes mellitus without complication   . Dementia   . Back pain, chronic     lumbar up to thoracic area  . Anxiety   . Recurrent cystitis   . Arthritis   . Hypercholesteremia   . Cancer     Breast cancer  . Complex endometrial hyperplasia 2004    renal insufficiency  . Headache(784.0) 10/29/2013  . Obese   . OSA on CPAP   . Depression 04/30/2014  . Chronic low back pain 04/30/2014    Past Surgical History  Procedure Laterality Date  . Breast surgery    . Foot neuroma surgery    . Vaginal hysterectomy  07/03/2012    Procedure: HYSTERECTOMY VAGINAL;  Surgeon: Peri Maris, MD;  Location: [facility];  Service: Gynecology;  Laterality: N/A;  . Examination under anesthesia  07/03/2012    Procedure: EXAM UNDER ANESTHESIA;  Surgeon: Reece Packer, MD;  Location: [facility];  Service: Urology;  Laterality: N/A;  . Cystoscopy  07/03/2012    Procedure: CYSTOSCOPY;  Surgeon: Reece Packer, MD;  Location: [facility];  Service: Urology;;  . Tubal ligation  1970  . Dilation and curettage of uterus  2004    polyp resection  . Tonsillectomy  1962  . Cataract extraction, bilateral  09/2012    Family History  Problem Relation Age of Onset  . Hypertension Mother   . Heart disease Mother   . Hypertension Father   . Dementia Father   . Depression Sister     Social history:  reports that she has never smoked. She has never used smokeless tobacco. She reports that she drinks alcohol. She reports that she does not use illicit drugs.    Allergies  Allergen Reactions  . Bee Venom Swelling  . Penicillins Swelling    Swelling of hands  . Sulfa Antibiotics     Medications:  Current Outpatient Prescriptions on File Prior to Visit  Medication Sig Dispense Refill  . ALPRAZolam (XANAX) 0.25 MG tablet Take 1 tablet (0.25 mg total) by mouth 3 (three) times daily as needed for anxiety.  15 tablet  0  . Calcium Carb-Cholecalciferol (CALCIUM-VITAMIN D) 600-400 MG-UNIT TABS Take 2 tablets by mouth daily.      . Cholecalciferol (VITAMIN D3) 1000 UNITS CAPS Take by mouth  daily.      . Cinnamon 500 MG capsule Take 500 mg by mouth daily.      . fenofibrate (TRICOR) 48 MG tablet Take 48 mg by mouth daily.      Marland Kitchen levothyroxine (SYNTHROID, LEVOTHROID) 25 MCG tablet Take 25 mcg by mouth daily.      Marland Kitchen losartan (COZAAR) 50 MG tablet Take 50 mg by mouth daily.      . Multiple Vitamins-Minerals (MULTIVITAMIN WITH MINERALS) tablet Take 1 tablet by mouth daily.      . Omega-3 Fatty Acids (FISH OIL) 1000 MG CAPS Take 1 capsule by mouth 2 (two) times daily.      . Pyridoxine HCl (VITAMIN B6 PO) Take by mouth daily.       No current facility-administered medications on file prior to visit.    ROS:  Out of a complete 14 system review of symptoms, the  patient complains only of the following symptoms, and all other reviewed systems are negative.  Ringing in the ears Sleep apnea on CPAP, sleep talking Cold intolerance Memory loss, headache Confusion, depression, anxiety  Blood pressure 125/68, pulse 54, height 5\' 5"  (1.651 m), weight 234 lb 12.8 oz (106.505 kg).  Physical Exam  General: The patient is alert and cooperative at the time of the examination. The patient is moderately to markedly obese.  Skin: No significant peripheral edema is noted.   Neurologic Exam  Mental status: The Mini-Mental status examination done today shows a total score of 21/30.  Cranial nerves: Facial symmetry is present. Speech is normal, no aphasia or dysarthria is noted. Extraocular movements are full. Visual fields are full.  Motor: The patient has good strength in all 4 extremities.  Sensory examination: Soft touch sensation is symmetric on the face, arms, and legs.  Coordination: The patient has good finger-nose-finger and heel-to-shin bilaterally.  Gait and station: The patient has a normal gait. Tandem gait is unsteady. Romberg is negative. No drift is seen.  Reflexes: Deep tendon reflexes are symmetric.   MRI brain 11/04/13:  IMPRESSION: Abnormal MRI scan of the brain showing mild changes of chronic microvascular ischemia and generalized cerebral atrophy.     MRI lumbar 03/26/13:  IMPRESSION:  1. Lumbar spondylosis and degenerative disc disease, causing  prominent impingement at L4-5 and L5-S1 ; mild to moderate  impingement at T11-12; and mild impingement at L1-2, and L3-4 as  detailed above.  2. Endplate edema along with some intervertebral disk edema at  T11-12. Given the distribution and sparing of the left side, I  suspect that this is due to degenerative disease rather than  discitis/osteomyelitis.    Assessment/Plan:  One. Progressive memory disorder  2. Chronic low back pain, left leg numbness  3. Depression  The  patient will continue to proceed with the evaluation for her low back pain. The patient has been breaking the 23 mg Aricept tablets in half, which should not be done. The patient will be converted back to the 10 mg Aricept tablets, taking 1 at night. The patient will be placed on Vyacog, 310 mg tablets daily. The patient will followup in about 6 months.  Jill Alexanders MD 04/30/2014 12:21 PM  [facility] [address] [place], [place] [address]  Phone [phone] Fax [phone]

## 2014-05-19 ENCOUNTER — Encounter: Payer: Self-pay | Admitting: Neurology

## 2014-06-04 ENCOUNTER — Encounter: Payer: Self-pay | Admitting: Neurology

## 2014-06-10 ENCOUNTER — Encounter: Payer: Self-pay | Admitting: Neurology

## 2014-07-30 ENCOUNTER — Other Ambulatory Visit: Payer: Self-pay

## 2014-07-30 DIAGNOSIS — Z1231 Encounter for screening mammogram for malignant neoplasm of breast: Secondary | ICD-10-CM

## 2014-08-28 ENCOUNTER — Ambulatory Visit: Payer: Self-pay

## 2014-09-01 ENCOUNTER — Ambulatory Visit: Payer: Self-pay

## 2014-09-08 ENCOUNTER — Ambulatory Visit
Admission: RE | Admit: 2014-09-08 | Discharge: 2014-09-08 | Disposition: A | Discharge status: Home / Self Care | Payer: Medicare Other | Source: Ambulatory Visit

## 2014-09-08 DIAGNOSIS — Z1231 Encounter for screening mammogram for malignant neoplasm of breast: Secondary | ICD-10-CM

## 2014-10-30 ENCOUNTER — Encounter: Payer: Self-pay | Admitting: Adult Health

## 2014-10-30 ENCOUNTER — Ambulatory Visit (INDEPENDENT_AMBULATORY_CARE_PROVIDER_SITE_OTHER): Payer: Medicare Other | Admitting: Adult Health

## 2014-10-30 VITALS — BP 145/69 | HR 71 | Ht 64.0 in | Wt 233.0 lb

## 2014-10-30 DIAGNOSIS — R413 Other amnesia: Secondary | ICD-10-CM

## 2014-10-30 NOTE — Progress Notes (Signed)
I have read the note, and I agree with the clinical assessment and plan.  Kiara Nelson,Kiara Nelson   

## 2014-10-30 NOTE — Patient Instructions (Signed)
Memory has remained stable.  Continue Aricept 10 mg daily. Let me know if your symptoms worsen.

## 2014-10-30 NOTE — Progress Notes (Signed)
PATIENT: Kiara Nelson DOB: 01-29-1934  REASON FOR VISIT: follow up- memory HISTORY FROM: patient  HISTORY OF PRESENT ILLNESS: Kiara Nelson is a 81 year old female with a history of progressive memory disorder. The patient returns today for follow-up. She is currently taking Aricept 10 mg daily and tolerating it well she denies any new neurological symptoms. She returns today for follow-up.Marland Kitchen Her husband states that she continues to have trouble with short-term memory but her long-term memory is intact. The patient is not operating a motor vehicle. She is able to complete all ADLs independently. She denies having to give up anything due to her memory. She states that she continues to work out in her garden. The patient does complain of some numbness, burning and tingling in the feet. Her husband states this has been going on for a while. In the past she was prescribed Lyrica by her primary care but was unable to tolerate it. They're currently using over-the-counter cream that tends to work well for the patient. She denies any new medical issues. Denies any new neurological symptoms. She returns today for follow-up.  HISTORY 04/30/14 (WILLIS): Kiara Nelson is a 81 year old white female with a history of a progressive memory disorder. The patient has been placed on Aricept 23 mg, but she was not able tolerate the drug. The patient is breaking the tablet in half and taking one half tablet daily. The patient has had some issues with low back pain, and some numbness down the left leg. She has had multiple injections in the back, and she is considering surgery. The patient has had some benefit with the Pristiq for the depression issues, currently on 100 mg daily. She is tolerating this medication well. She has not had any other significant medical issues that have come up since last seen. The husband believes that her short-term memory is getting worse over time, she is repeating herself more frequently. The  patient does not operate a motor vehicle. The patient continues to have one or 2 headaches a week. Prior sedimentation rate was unremarkable. MRI of the brain done previously does not show significant intracranial abnormalities  REVIEW OF SYSTEMS: Out of a complete 14 system review of symptoms, the patient complains only of the following symptoms, and all other reviewed systems are negative.  ALLERGIES: Allergies  Allergen Reactions  . Bee Venom Swelling  . Penicillins Swelling    Swelling of hands  . Sulfa Antibiotics     HOME MEDICATIONS: Outpatient Prescriptions Prior to Visit  Medication Sig Dispense Refill  . ALPRAZolam (XANAX) 0.25 MG tablet Take 1 tablet (0.25 mg total) by mouth 3 (three) times daily as needed for anxiety. 15 tablet 0  . Cholecalciferol (VITAMIN D3) 1000 UNITS CAPS Take by mouth daily.    . Cinnamon 500 MG capsule Take 500 mg by mouth daily.    Marland Kitchen desvenlafaxine (PRISTIQ) 100 MG 24 hr tablet Take 1 tablet (100 mg total) by mouth daily. 90 tablet 1  . donepezil (ARICEPT) 10 MG tablet Take 1 tablet (10 mg total) by mouth at bedtime. 90 tablet 3  . fenofibrate (TRICOR) 48 MG tablet Take 48 mg by mouth daily.    Marland Kitchen levothyroxine (SYNTHROID, LEVOTHROID) 25 MCG tablet Take 25 mcg by mouth daily.    Marland Kitchen losartan (COZAAR) 50 MG tablet Take 50 mg by mouth daily.    . Multiple Vitamins-Minerals (MULTIVITAMIN WITH MINERALS) tablet Take 1 tablet by mouth daily.    . Omega-3 Fatty Acids (FISH OIL) 1000  MG CAPS Take 1 capsule by mouth 2 (two) times daily.    . Pyridoxine HCl (VITAMIN B6 PO) Take by mouth daily.    . Calcium Carb-Cholecalciferol (CALCIUM-VITAMIN D) 600-400 MG-UNIT TABS Take 2 tablets by mouth daily.     No facility-administered medications prior to visit.    PAST MEDICAL HISTORY: Past Medical History  Diagnosis Date  . Hypertension   . Diabetes mellitus without complication   . Dementia   . Back pain, chronic     lumbar up to thoracic area  . Anxiety   .  Recurrent cystitis   . Arthritis   . Hypercholesteremia   . Cancer     Breast cancer  . Complex endometrial hyperplasia 2004    renal insufficiency  . Headache(784.0) 10/29/2013  . Obese   . OSA on CPAP   . Depression 04/30/2014  . Chronic low back pain 04/30/2014    PAST SURGICAL HISTORY: Past Surgical History  Procedure Laterality Date  . Breast surgery    . Foot neuroma surgery    . Vaginal hysterectomy  07/03/2012    Procedure: HYSTERECTOMY VAGINAL;  Surgeon: Peri Maris, MD;  Location: [facility];  Service: Gynecology;  Laterality: N/A;  . Examination under anesthesia  07/03/2012    Procedure: EXAM UNDER ANESTHESIA;  Surgeon: Reece Packer, MD;  Location: [facility];  Service: Urology;  Laterality: N/A;  . Cystoscopy  07/03/2012    Procedure: CYSTOSCOPY;  Surgeon: Reece Packer, MD;  Location: [facility];  Service: Urology;;  . Tubal ligation  1970  . Dilation and curettage of uterus  2004    polyp resection  . Tonsillectomy  1962  . Cataract extraction, bilateral  09/2012    FAMILY HISTORY: Family History  Problem Relation Age of Onset  . Hypertension Mother   . Heart disease Mother   . Hypertension Father   . Dementia Father   . Depression Sister     SOCIAL HISTORY: History   Social History  . Marital Status: Married    Spouse Name: N/A  . Number of Children: 3  . Years of Education: college   Occupational History  . Retired    Social History Main Topics  . Smoking status: Never Smoker   . Smokeless tobacco: Never Used  . Alcohol Use: 0.0 oz/week    0 Standard drinks or equivalent per week     Comment: rarely  . Drug Use: No  . Sexual Activity:    Partners: Male    Birth Control/ Protection: Post-menopausal, Surgical     Comment: TV[facility]   Other Topics Concern  . Not on file   Social History Narrative   Patient lives at home with her husband Gwyndolyn Saxon).   Education college.   Retired. School Pharmacist, hospital.   Right handed.   Caffeine -None.       PHYSICAL EXAM  Filed Vitals:   10/30/14 1107  BP: 145/69  Pulse: 71  Height: 5\' 4"  (1.626 m)  Weight: 233 lb (105.688 kg)   Body mass index is 39.97 kg/(m^2).  Generalized: Well developed, in no acute distress   Neurological examination  Mentation: Alert.  Follows all commands speech and language fluent. MMSE 23/30 Cranial nerve II-XII: Pupils were equal round reactive to light. Extraocular movements were full, visual field were full on confrontational test. Facial sensation and strength were normal. Uvula tongue midline. Head turning and shoulder shrug  were normal and symmetric. Motor: The motor testing reveals 5 over 5 strength  of all 4 extremities. Good symmetric motor tone is noted throughout.  Sensory: Sensory testing is intact to soft touch on all 4 extremities. No evidence of extinction is noted.  Coordination: Cerebellar testing reveals good finger-nose-finger and heel-to-shin bilaterally.  Gait and station: Gait is normal. Tandem gait is unsteady. Romberg is negative. No drift is seen.  Reflexes: Deep tendon reflexes are symmetric and normal bilaterally.   DIAGNOSTIC DATA (LABS, IMAGING, TESTING) - I reviewed patient records, labs, notes, testing and imaging myself where available.   ASSESSMENT AND PLAN 81 y.o. year old female  has a past medical history of Hypertension; Diabetes mellitus without complication; Dementia; Back pain, chronic; Anxiety; Recurrent cystitis; Arthritis; Hypercholesteremia; Cancer; Complex endometrial hyperplasia (2004); [id number]) (10/29/2013); Obese; OSA on CPAP; Depression (04/30/2014); and Chronic low back pain (04/30/2014). here with:  1. Progressive memory disorder  Overall the patient's memory has remained stable. Her MMSE is 23/30 was previously 21/30. She will continue taking Aricept 10 mg daily for her memory. Patient advised that if her symptoms worsen or she develops new symptoms she she'll let us know. Otherwise she will  follow-up in 6 months or sooner if needed.     Ward Givens, MSN, NP-C 10/30/2014, 11:39 AM [facility] [address] [place], [place] [address] [phone]  Note: This document was prepared with digital dictation and possible smart phrase technology. Any transcriptional errors that result from this process are unintentional.

## 2015-02-25 ENCOUNTER — Telehealth: Payer: Self-pay | Admitting: Neurology

## 2015-02-25 MED ORDER — PHOSPHATIDYLSERINE-DHA-EPA 100-19.5-6.5 MG PO CAPS: 1.0000 | ORAL_CAPSULE | Freq: Every day | ORAL | Status: DC

## 2015-02-25 NOTE — Telephone Encounter (Signed)
Patient's husband is returning a call regarding the patient.

## 2015-02-25 NOTE — Telephone Encounter (Signed)
Husband called requesting refills on Vayacog, should they go to 60Q vs 30Q?

## 2015-02-25 NOTE — Telephone Encounter (Signed)
I called the patient's husband. He stated he cannot tell if the Talbert Nan is helping, but the patient is not getting any worse right now. He is requesting a refill for 2 months worth of the medication to last until they come back for the patient's next appointment.

## 2015-02-25 NOTE — Telephone Encounter (Signed)
I called the patient and left a voicemail.

## 2015-02-25 NOTE — Telephone Encounter (Signed)
I will call in a Rx for Vayacog.

## 2015-03-25 ENCOUNTER — Other Ambulatory Visit: Payer: Self-pay | Admitting: Neurology

## 2015-03-25 NOTE — Telephone Encounter (Signed)
Prescribed at [facility] on 10/14

## 2015-03-27 ENCOUNTER — Encounter: Payer: Self-pay | Admitting: Obstetrics & Gynecology

## 2015-03-27 ENCOUNTER — Ambulatory Visit: Payer: Medicare Other | Admitting: Obstetrics & Gynecology

## 2015-04-09 ENCOUNTER — Encounter: Payer: Self-pay | Admitting: Obstetrics & Gynecology

## 2015-04-09 ENCOUNTER — Ambulatory Visit (INDEPENDENT_AMBULATORY_CARE_PROVIDER_SITE_OTHER): Payer: Medicare Other | Admitting: Obstetrics & Gynecology

## 2015-04-09 VITALS — BP 130/70 | HR 70 | Resp 14 | Ht 63.5 in | Wt 234.0 lb

## 2015-04-09 DIAGNOSIS — N8110 Cystocele, unspecified: Secondary | ICD-10-CM

## 2015-04-09 DIAGNOSIS — I10 Essential (primary) hypertension: Secondary | ICD-10-CM

## 2015-04-09 DIAGNOSIS — F0390 Unspecified dementia without behavioral disturbance: Secondary | ICD-10-CM

## 2015-04-09 DIAGNOSIS — E038 Other specified hypothyroidism: Secondary | ICD-10-CM | POA: Diagnosis not present

## 2015-04-09 DIAGNOSIS — Z01419 Encounter for gynecological examination (general) (routine) without abnormal findings: Secondary | ICD-10-CM

## 2015-04-09 DIAGNOSIS — E119 Type 2 diabetes mellitus without complications: Secondary | ICD-10-CM

## 2015-04-09 DIAGNOSIS — IMO0002 Reserved for concepts with insufficient information to code with codable children: Secondary | ICD-10-CM

## 2015-04-09 DIAGNOSIS — Z0000 Encounter for general adult medical examination without abnormal findings: Secondary | ICD-10-CM | POA: Diagnosis not present

## 2015-04-09 DIAGNOSIS — N3946 Mixed incontinence: Secondary | ICD-10-CM

## 2015-04-09 DIAGNOSIS — E039 Hypothyroidism, unspecified: Secondary | ICD-10-CM | POA: Insufficient documentation

## 2015-04-09 LAB — POCT URINALYSIS DIPSTICK
Bilirubin, UA: NEGATIVE
KETONES UA: NEGATIVE
PH UA: 5
PROTEIN UA: NEGATIVE
Urobilinogen, UA: NEGATIVE

## 2015-04-09 MED ORDER — NYSTATIN 100000 UNIT/GM EX POWD: Freq: Every day | CUTANEOUS | Status: DC

## 2015-04-09 MED ORDER — NYSTATIN 100000 UNIT/GM EX CREA: 1.0000 "application " | TOPICAL_CREAM | Freq: Two times a day (BID) | CUTANEOUS | Status: DC

## 2015-04-09 NOTE — Progress Notes (Signed)
81 y.o. G3P3 MarriedCaucasianF here for annual exam.  Pt's husband is here with her today.  Having much more issues with dementia.  She is seeing Dr. Jannifer Franklin and is on aricept.  She is not driving any longer.  She is having sciatica issues.  Husband feels she should proceed with back surgery.  Wants my opinion.  Has experienced significant memory changes in the last year.  Husband worried about weight because pt is not exercising due to back issues.  Pt has been obese the entire time I've known her and weight not significantly changed in several years.   Pt reports she is having increased issues with her bladder.  Wearing pads all of the time.  Denies vaginal bleeding.   D/w pt's husband advanced directives.  They do have living will.  Not interested in discussing DNR at this time.   PCP:  Dr. Maudie Mercury.   Neurologist:  Dr. Jannifer Franklin  No LMP recorded. Patient has had a hysterectomy.          Sexually active: Yes.    The current method of family planning is none. Hysterectomy. Exercising: No.  none Smoker:  no  Health Maintenance: Pap: 10/27/2011 neg History of abnormal Pap:  no MMG:  09/08/14 dense category c, bi-rads  c 1 neg Colonoscopy:  2010 with Dr. Lajoyce Corners.  Had one small polyp.   TDaP:  Pt is unsure BMD:  2009 1.1/0.2 Screening Labs: PCP, Hb today: PCP, Urine today:    reports that she has never smoked. She has never used smokeless tobacco. She reports that she drinks alcohol. She reports that she does not use illicit drugs.  Past Medical History  Diagnosis Date  . Hypertension   . Diabetes mellitus without complication   . Dementia   . Back pain, chronic     lumbar up to thoracic area  . Anxiety   . Recurrent cystitis   . Arthritis   . Hypercholesteremia   . Cancer     Breast cancer  . Complex endometrial hyperplasia 2004    renal insufficiency  . Headache(784.0) 10/29/2013  . Obese   . OSA on CPAP   . Depression 04/30/2014  . Chronic low back pain 04/30/2014    Past Surgical  History  Procedure Laterality Date  . Breast surgery    . Foot neuroma surgery    . Vaginal hysterectomy  07/03/2012    Procedure: HYSTERECTOMY VAGINAL;  Surgeon: Peri Maris, MD;  Location: [facility];  Service: Gynecology;  Laterality: N/A;  . Examination under anesthesia  07/03/2012    Procedure: EXAM UNDER ANESTHESIA;  Surgeon: Reece Packer, MD;  Location: [facility];  Service: Urology;  Laterality: N/A;  . Cystoscopy  07/03/2012    Procedure: CYSTOSCOPY;  Surgeon: Reece Packer, MD;  Location: [facility];  Service: Urology;;  . Tubal ligation  1970  . Dilation and curettage of uterus  2004    polyp resection  . Tonsillectomy  1962  . Cataract extraction, bilateral  09/2012    Current Outpatient Prescriptions  Medication Sig Dispense Refill  . ALPRAZolam (XANAX) 0.25 MG tablet Take 1 tablet (0.25 mg total) by mouth 3 (three) times daily as needed for anxiety. 15 tablet 0  . Cholecalciferol (VITAMIN D3) 1000 UNITS CAPS Take by mouth daily.    Marland Kitchen donepezil (ARICEPT) 10 MG tablet Take 1 tablet (10 mg total) by mouth at bedtime. 90 tablet 3  . fenofibrate (TRICOR) 48 MG tablet Take 48 mg by mouth  daily.    . levothyroxine (SYNTHROID, LEVOTHROID) 50 MCG tablet Take 50 mcg by mouth daily.    Marland Kitchen losartan (COZAAR) 50 MG tablet Take 50 mg by mouth daily.    . Multiple Vitamins-Minerals (MULTIVITAMIN WITH MINERALS) tablet Take 1 tablet by mouth daily.    . Omega-3 Fatty Acids (FISH OIL) 1000 MG CAPS Take 1 capsule by mouth 2 (two) times daily.    Marland Kitchen PRISTIQ 100 MG 24 hr tablet TAKE ONE TABLET BY MOUTH ONCE DAILY 90 tablet 2  . traMADol (ULTRAM) 50 MG tablet Take 50 mg by mouth daily.    . Cinnamon 500 MG capsule Take 500 mg by mouth daily.    Marland Kitchen levothyroxine (SYNTHROID, LEVOTHROID) 25 MCG tablet Take 25 mcg by mouth daily.    . Phosphatidylserine-DHA-EPA (VAYACOG) 100-19.5-6.5 MG CAPS Take 1 tablet by mouth daily. (Patient not taking: Reported on 04/09/2015) 30 capsule 3  . Pyridoxine HCl  (VITAMIN B6 PO) Take by mouth daily.     No current facility-administered medications for this visit.    Family History  Problem Relation Age of Onset  . Hypertension Mother   . Heart disease Mother   . Hypertension Father   . Dementia Father   . Depression Sister     ROS:  Pertinent items are noted in HPI.  Otherwise, a comprehensive ROS was negative.  Exam:   BP 130/70 mmHg  Pulse 70  Resp 14  Ht 5' 3.5" (1.613 m)  Wt 234 lb (106.142 kg)  BMI 40.80 kg/m2  Weight change: +1   Height: 5' 3.5" (161.3 cm)  Ht Readings from Last 3 Encounters:  04/09/15 5' 3.5" (1.613 m)  10/30/14 5\' 4"  (1.626 m)  04/30/14 5\' 5"  (1.651 m)    General appearance: alert, cooperative and appears stated age Head: Normocephalic, without obvious abnormality, atraumatic Neck: no adenopathy, supple, symmetrical, trachea midline and thyroid normal to inspection and palpation Lungs: clear to auscultation bilaterally Breasts: normal appearance, no masses or tenderness Heart: regular rate and rhythm Abdomen: soft, non-tender; bowel sounds normal; no masses,  no organomegaly Extremities: extremities normal, atraumatic, no cyanosis or edema Skin: Skin color, texture, turgor normal. Candida appearing rash under pannus fold on right Lymph nodes: Cervical, supraclavicular, and axillary nodes normal. No abnormal inguinal nodes palpated Neurologic: Grossly normal   Pelvic: External genitalia:  no lesions              Urethra:  normal appearing urethra with no masses, tenderness or lesions              Bartholins and Skenes: normal                 Vagina: normal appearing vagina with normal color and discharge, no lesions, cystocele with right paravaginal defect              Cervix: absent              Pap taken: No. Bimanual Exam:  Uterus:  uterus absent              Adnexa: no mass, fullness, tenderness               Rectovaginal: Confirms               Anus:  normal sphincter tone, no  lesions  Chaperone was present for exam.  A:  Well Woman with normal exam Morbid obesity Hypertension Diabetes Elevated lipids Sciatica Dementia Skin candida Cystocele with mixed incontinence Hypothyroidism  P:   Mammogram yearly pap smear not indicated Labs/vaccines done with Dr. Maudie Mercury Follow up with Dr. Jannifer Franklin scheduled Nystatin cream and powder rx to pharmacy for skin yeast. return annually or prn

## 2015-05-01 ENCOUNTER — Ambulatory Visit: Payer: Medicare Other | Admitting: Adult Health

## 2015-05-04 ENCOUNTER — Ambulatory Visit (INDEPENDENT_AMBULATORY_CARE_PROVIDER_SITE_OTHER): Payer: Medicare Other | Admitting: Adult Health

## 2015-05-04 ENCOUNTER — Encounter: Payer: Self-pay | Admitting: Adult Health

## 2015-05-04 VITALS — BP 128/67 | HR 87 | Ht 63.5 in | Wt 233.0 lb

## 2015-05-04 DIAGNOSIS — G8929 Other chronic pain: Secondary | ICD-10-CM | POA: Diagnosis not present

## 2015-05-04 DIAGNOSIS — R413 Other amnesia: Secondary | ICD-10-CM

## 2015-05-04 DIAGNOSIS — M545 Low back pain: Secondary | ICD-10-CM

## 2015-05-04 NOTE — Progress Notes (Signed)
PATIENT: Kiara Nelson DOB: 1934-02-21  REASON FOR VISIT: follow up- memory HISTORY FROM: patient  HISTORY OF PRESENT ILLNESS: Kiara Nelson is a 81 year old female with a history of progressive memory disorder. She returns today for follow-up. She is currently taking Aricept 10 mg daily. She reports that her memory has remained stable. She is able to complete all ADLs independently. She is able to help with household chores. She does not operate a motor vehicle. Husband states that he primarily makes all meals however she does help in the kitchen. Husband also states that she does not exercise as frequently as she used to. He states [place] no [place] this. Patient continues to havechronic back pain. She is seeing an orthopedic surgeon for this and they have recommended surgery however the patient has deferred on this in the past. However now she states that she is scheduling a follow-up to discuss this further. She returns today for an evaluation.  HISTORY  10/30/14: Kiara Nelson is a 81 year old female with a history of progressive memory disorder. The patient returns today for follow-up. She is currently taking Aricept 10 mg daily and tolerating it well she denies any new neurological symptoms. She returns today for follow-up.Marland Kitchen Her husband states that she continues to have trouble with short-term memory but her long-term memory is intact. The patient is not operating a motor vehicle. She is able to complete all ADLs independently. She denies having to give up anything due to her memory. She states that she continues to work out in her garden. The patient does complain of some numbness, burning and tingling in the feet. Her husband states this has been going on for a while. In the past she was prescribed Lyrica by her primary care but was unable to tolerate it. They're currently using over-the-counter cream that tends to work well for the patient. She  denies any new medical issues. Denies any new neurological symptoms. She returns today for follow-up.  REVIEW OF SYSTEMS: Out of a complete 14 system review of symptoms, the patient complains only of the following symptoms, and all other reviewed systems are negative.  See HPI  ALLERGIES: Allergies  Allergen Reactions  . Bee Venom Swelling  . Penicillins Swelling    Swelling of hands  . Sulfa Antibiotics     HOME MEDICATIONS: Outpatient Prescriptions Prior to Visit  Medication Sig Dispense Refill  . ALPRAZolam (XANAX) 0.25 MG tablet Take 1 tablet (0.25 mg total) by mouth 3 (three) times daily as needed for anxiety. 15 tablet 0  . Cholecalciferol (VITAMIN D3) 1000 UNITS CAPS Take by mouth daily.    . Cinnamon 500 MG capsule Take 500 mg by mouth daily.    Marland Kitchen donepezil (ARICEPT) 10 MG tablet Take 1 tablet (10 mg total) by mouth at bedtime. 90 tablet 3  . fenofibrate (TRICOR) 48 MG tablet Take 48 mg by mouth daily.    Marland Kitchen levothyroxine (SYNTHROID, LEVOTHROID) 25 MCG tablet Take 25 mcg by mouth daily.    Marland Kitchen levothyroxine (SYNTHROID, LEVOTHROID) 50 MCG tablet Take 50 mcg by mouth daily.    Marland Kitchen losartan (COZAAR) 50 MG tablet Take 50 mg by mouth daily.    . Multiple Vitamins-Minerals (MULTIVITAMIN WITH MINERALS) tablet Take 1 tablet by mouth daily.    Marland Kitchen nystatin (MYCOSTATIN) powder Apply topically daily. Start using after skin has healed 15 g 2  . nystatin cream (MYCOSTATIN) Apply 1 application topically 2 (two) times  daily. Apply to affected area BID for up to 7 days. 30 g 0  . Omega-3 Fatty Acids (FISH OIL) 1000 MG CAPS Take 1 capsule by mouth 2 (two) times daily.    . Phosphatidylserine-DHA-EPA (VAYACOG) 100-19.5-6.5 MG CAPS Take 1 tablet by mouth daily. (Patient not taking: Reported on 04/09/2015) 30 capsule 3  . PRISTIQ 100 MG 24 hr tablet TAKE ONE TABLET BY MOUTH ONCE DAILY 90 tablet 2  . Pyridoxine HCl (VITAMIN B6 PO) Take by mouth daily.    . traMADol (ULTRAM) 50 MG tablet Take 50 mg by  mouth daily.     No facility-administered medications prior to visit.    PAST MEDICAL HISTORY: Past Medical History  Diagnosis Date  . Hypertension   . Diabetes mellitus without complication   . Dementia   . Back pain, chronic     lumbar up to thoracic area  . Anxiety   . Recurrent cystitis   . Arthritis   . Hypercholesteremia   . Cancer     Breast cancer  . Complex endometrial hyperplasia 2004    renal insufficiency  . Headache(784.0) 10/29/2013  . Obese   . OSA on CPAP   . Depression 04/30/2014  . Chronic low back pain 04/30/2014    PAST SURGICAL HISTORY: Past Surgical History  Procedure Laterality Date  . Breast surgery    . Foot neuroma surgery    . Vaginal hysterectomy  07/03/2012    Procedure: HYSTERECTOMY VAGINAL;  Surgeon: Peri Maris, MD;  Location: [facility];  Service: Gynecology;  Laterality: N/A;  . Examination under anesthesia  07/03/2012    Procedure: EXAM UNDER ANESTHESIA;  Surgeon: Reece Packer, MD;  Location: [facility];  Service: Urology;  Laterality: N/A;  . Cystoscopy  07/03/2012    Procedure: CYSTOSCOPY;  Surgeon: Reece Packer, MD;  Location: [facility];  Service: Urology;;  . Tubal ligation  1970  . Dilation and curettage of uterus  2004    polyp resection  . Tonsillectomy  1962  . Cataract extraction, bilateral  09/2012    FAMILY HISTORY: Family History  Problem Relation Age of Onset  . Hypertension Mother   . Heart disease Mother   . Hypertension Father   . Dementia Father   . Depression Sister     SOCIAL HISTORY: Social History   Social History  . Marital Status: Married    Spouse Name: N/A  . Number of Children: 3  . Years of Education: college   Occupational History  . Retired    Social History Main Topics  . Smoking status: Never Smoker   . Smokeless tobacco: Never Used  . Alcohol Use: 0.0 oz/week    0 Standard drinks or equivalent per week     Comment: rarely  . Drug Use: No  . Sexual Activity:    Partners:  Male    Birth Control/ Protection: Post-menopausal, Surgical     Comment: TVH   Other Topics Concern  . Not on file   Social History Narrative   Patient lives at home with her husband Gwyndolyn Saxon).   Education college.   Retired. School Pharmacist, hospital.   Right handed.   Caffeine -None.      PHYSICAL EXAM  Filed Vitals:   05/04/15 0957  BP: 128/67  Pulse: 87  Height: 5' 3.5" (1.613 m)  Weight: 233 lb (105.688 kg)   Body mass index is 40.62 kg/(m^2).  MMSE - Mini Mental State Exam 05/04/2015  Orientation to time 2  Orientation to Place 3  Registration 3  Attention/ Calculation 5  Recall 0  Language- name 2 objects 2  Language- repeat 0  Language- follow 3 step command 3  Language- read & follow direction 1  Write a sentence 1  Copy design 1  Total score 21    Generalized: Well developed, in no acute distress   Neurological examination  Mentation: Alert. Follows all commands speech and language fluent Cranial nerve II-XII: Pupils were equal round reactive to light. Extraocular movements were full, visual field were full on confrontational test. Facial sensation and strength were normal. Uvula tongue midline. Head turning and shoulder shrug  were normal and symmetric. Motor: The motor testing reveals 5 over 5 strength of all 4 extremities. Good symmetric motor tone is noted throughout.  Sensory: Sensory testing is intact to soft touch on all 4 extremities. No evidence of extinction is noted.  Coordination: Cerebellar testing reveals good finger-nose-finger and heel-to-shin bilaterally.  Gait and station: Gait is normal. Tandem gait is normal. Romberg is negative. No drift is seen.  Reflexes: Deep tendon reflexes are symmetric and normal bilaterally.   DIAGNOSTIC DATA (LABS, IMAGING, TESTING) - I reviewed patient records, labs, notes, testing and imaging myself where available.     ASSESSMENT AND PLAN 81 y.o. year old female  has a past medical history of Hypertension;  Diabetes mellitus without complication; Dementia; Back pain, chronic; Anxiety; Recurrent cystitis; Arthritis; Hypercholesteremia; Cancer; Complex endometrial hyperplasia (2004); [id number]) (10/29/2013); Obese; OSA on CPAP; Depression (04/30/2014); and Chronic low back pain (04/30/2014). here with:  1. Memory Disorder  2. Chronic back pain  The patient's memory has remained stable. Her MMSE today is 21/30 was previously 23/30. She will continue on Aricept 10 mg daily for her memory. I have encouraged the patient to follow-up with her orthopedist regarding her back pain. Patient advised that if her symptoms worsen or she develops new symptoms she should let us know. She will follow-up in 6 months or sooner if needed.  I spent 25 minutes with the patient 50% of this time was spent counseling the patient on her diagnosis and treatment.  Ward Givens, MSN, NP-C 05/04/2015, 9:59 AM [facility] [address], [place], [place] [address] [phone]

## 2015-05-04 NOTE — Progress Notes (Signed)
I have read the note, and I agree with the clinical assessment and plan.  Kiara Nelson,Kiara Nelson   

## 2015-05-04 NOTE — Patient Instructions (Addendum)
Continue Aricept We will continue to monitor your memory. Continue Pristiq for depression. If your symptoms worsen or you develop new symptoms please let us know.

## 2015-05-25 ENCOUNTER — Other Ambulatory Visit: Payer: Self-pay | Admitting: Neurology

## 2015-06-28 ENCOUNTER — Other Ambulatory Visit: Payer: Self-pay | Admitting: Neurology

## 2015-08-13 ENCOUNTER — Other Ambulatory Visit: Payer: Self-pay

## 2015-08-13 DIAGNOSIS — Z853 Personal history of malignant neoplasm of breast: Secondary | ICD-10-CM

## 2015-08-13 DIAGNOSIS — Z1231 Encounter for screening mammogram for malignant neoplasm of breast: Secondary | ICD-10-CM

## 2015-09-11 ENCOUNTER — Ambulatory Visit
Admission: RE | Admit: 2015-09-11 | Discharge: 2015-09-11 | Disposition: A | Discharge status: Home / Self Care | Payer: Medicare Other | Source: Ambulatory Visit

## 2015-09-11 DIAGNOSIS — Z1231 Encounter for screening mammogram for malignant neoplasm of breast: Secondary | ICD-10-CM

## 2015-09-11 DIAGNOSIS — Z853 Personal history of malignant neoplasm of breast: Secondary | ICD-10-CM

## 2015-10-14 ENCOUNTER — Other Ambulatory Visit: Payer: Self-pay | Admitting: Neurology

## 2015-10-27 ENCOUNTER — Encounter: Payer: Self-pay | Admitting: Adult Health

## 2015-10-27 ENCOUNTER — Ambulatory Visit (INDEPENDENT_AMBULATORY_CARE_PROVIDER_SITE_OTHER): Payer: Medicare Other | Admitting: Adult Health

## 2015-10-27 VITALS — BP 137/75 | HR 67 | Ht 63.5 in | Wt 235.4 lb

## 2015-10-27 DIAGNOSIS — M549 Dorsalgia, unspecified: Secondary | ICD-10-CM | POA: Diagnosis not present

## 2015-10-27 DIAGNOSIS — F0390 Unspecified dementia without behavioral disturbance: Secondary | ICD-10-CM

## 2015-10-27 DIAGNOSIS — G8929 Other chronic pain: Secondary | ICD-10-CM

## 2015-10-27 NOTE — Progress Notes (Signed)
I have read the note, and I agree with the clinical assessment and plan.  Kiara Nelson   

## 2015-10-27 NOTE — Patient Instructions (Signed)
Aricept 10 mg daily Memory score is stable If your symptoms worsen or you develop new symptoms please let us know.

## 2015-10-27 NOTE — Progress Notes (Signed)
PATIENT: Kiara Nelson DOB: 07/12/1934  REASON FOR VISIT: follow up- memory disorder, chronic back pain HISTORY FROM: patient  HISTORY OF PRESENT ILLNESS: Kiara Nelson is an 82 year old female with a history of memory disorder. She returns today for follow-up. She continues to take Aricept and vayacog. She is tolerating these medications well. Her husband states that he's noticed that she tends to be more forgetful. He finds himself repeating himself a lot and having to explain things. He states that recently she has been asking about her mother and unaware that she passed away several years ago. The patient is able to complete all ADLs independently. She does help with household chores. She does not operate a motor vehicle. She continues to have chronic back pain. Her husband states that she's been participating in physical therapy and has gotten good benefit. She is currently monitoring her diet in the hopes of losing more weight. She returns today for an evaluation.  HISTORY  05/04/15: Kiara Nelson is a 82 year old female with a history of progressive memory disorder. She returns today for follow-up. She is currently taking Aricept 10 mg daily. She reports that her memory has remained stable. She is able to complete all ADLs independently. She is able to help with household chores. She does not operate a motor vehicle. Husband states that he primarily makes all meals however she does help in the kitchen. Husband also states that she does not exercise as frequently as she used to. He states that [place] however they no longer do this. Patient continues to havechronic back pain. She is seeing an orthopedic surgeon for this and they have recommended surgery however the patient has deferred on this in the past. However now she states that she is scheduling a follow-up to discuss this further. She returns today for an evaluation.  HISTORY  10/30/14: Kiara Nelson is a  82 year old female with a history of progressive memory disorder. The patient returns today for follow-up. She is currently taking Aricept 10 mg daily and tolerating it well she denies any new neurological symptoms. She returns today for follow-up.Marland Kitchen Her husband states that she continues to have trouble with short-term memory but her long-term memory is intact. The patient is not operating a motor vehicle. She is able to complete all ADLs independently. She denies having to give up anything due to her memory. She states that she continues to work out in her garden. The patient does complain of some numbness, burning and tingling in the feet. Her husband states this has been going on for a while. In the past she was prescribed Lyrica by her primary care but was unable to tolerate it. They're currently using over-the-counter cream that tends to work well for the patient. She denies any new medical issues. Denies any new neurological symptoms. She returns today for follow-up.  REVIEW OF SYSTEMS: Out of a complete 14 system review of symptoms, the patient complains only of the following symptoms, and all other reviewed systems are negative.  Ringing in ears, apnea, sleep talking, acting out dreams, [place], back pain, confusion, depression, nervous/anxious, incontinence of bladder  ALLERGIES: Allergies  Allergen Reactions  . Bee Venom Swelling  . Penicillins Swelling    Swelling of hands  . Sulfa Antibiotics     HOME MEDICATIONS: Outpatient Prescriptions Prior to Visit  Medication Sig Dispense Refill  . Cholecalciferol (VITAMIN D3) 1000 UNITS CAPS Take by mouth daily.    . Cinnamon 500 MG  capsule Take 500 mg by mouth daily.    Marland Kitchen donepezil (ARICEPT) 10 MG tablet TAKE ONE TABLET BY MOUTH AT BEDTIME 90 tablet 1  . fenofibrate (TRICOR) 48 MG tablet Take 48 mg by mouth daily.    Marland Kitchen LORazepam (ATIVAN) 1 MG tablet Take 0.5 tablets by mouth at bedtime as needed and may repeat dose one time if needed.    Marland Kitchen  losartan (COZAAR) 50 MG tablet Take 50 mg by mouth daily.    . Multiple Vitamins-Minerals (MULTIVITAMIN WITH MINERALS) tablet Take 1 tablet by mouth daily.    Marland Kitchen nystatin (MYCOSTATIN) powder Apply topically daily. Start using after skin has healed 15 g 2  . Omega-3 Fatty Acids (FISH OIL) 1000 MG CAPS Take 1 capsule by mouth 2 (two) times daily.    Marland Kitchen PRISTIQ 100 MG 24 hr tablet TAKE ONE TABLET BY MOUTH ONCE DAILY 90 tablet 2  . Pyridoxine HCl (VITAMIN B6 PO) Take by mouth daily.    . traMADol (ULTRAM) 50 MG tablet Take 50 mg by mouth every 6 (six) hours as needed.     Marland Kitchen VAYACOG 100-19.5-6.5 MG CAPS TAKE ONE CAPSULE BY MOUTH ONCE DAILY 30 capsule 3  . levothyroxine (SYNTHROID, LEVOTHROID) 25 MCG tablet Take 25 mcg by mouth daily. 1.5 tab daily    . PRISTIQ 100 MG 24 hr tablet TAKE ONE TABLET BY MOUTH ONCE DAILY 90 tablet 0   No facility-administered medications prior to visit.    PAST MEDICAL HISTORY: Past Medical History  Diagnosis Date  . Hypertension   . Diabetes mellitus without complication ([place])   . Dementia   . Back pain, chronic     lumbar up to thoracic area  . Anxiety   . Recurrent cystitis   . Arthritis   . Hypercholesteremia   . Cancer [facility])     Breast cancer  . Complex endometrial hyperplasia 2004    renal insufficiency  . Headache(784.0) 10/29/2013  . Obese   . OSA on CPAP   . Depression 04/30/2014  . Chronic low back pain 04/30/2014    PAST SURGICAL HISTORY: Past Surgical History  Procedure Laterality Date  . Breast surgery    . Foot neuroma surgery    . Vaginal hysterectomy  07/03/2012    Procedure: HYSTERECTOMY VAGINAL;  Surgeon: Peri Maris, MD;  Location: [facility];  Service: Gynecology;  Laterality: N/A;  . Examination under anesthesia  07/03/2012    Procedure: EXAM UNDER ANESTHESIA;  Surgeon: Reece Packer, MD;  Location: [facility];  Service: Urology;  Laterality: N/A;  . Cystoscopy  07/03/2012    Procedure: CYSTOSCOPY;  Surgeon: Reece Packer,  MD;  Location: [facility];  Service: Urology;;  . Tubal ligation  1970  . Dilation and curettage of uterus  2004    polyp resection  . Tonsillectomy  1962  . Cataract extraction, bilateral  09/2012    FAMILY HISTORY: Family History  Problem Relation Age of Onset  . Hypertension Mother   . Heart disease Mother   . Hypertension Father   . Dementia Father   . Depression Sister     SOCIAL HISTORY: Social History   Social History  . Marital Status: Married    Spouse Name: N/A  . Number of Children: 3  . Years of Education: college   Occupational History  . Retired    Social History Main Topics  . Smoking status: Never Smoker   . Smokeless tobacco: Never Used  . Alcohol Use: 0.0  oz/week    0 Standard drinks or equivalent per week     Comment: rarely  . Drug Use: No  . Sexual Activity:    Partners: Male    Birth Control/ Protection: Post-menopausal, Surgical     Comment: TVH   Other Topics Concern  . Not on file   Social History Narrative   Patient lives at home with her husband Gwyndolyn Saxon).   Education college.   Retired. School Pharmacist, hospital.   Right handed.   Caffeine -None.      PHYSICAL EXAM  Filed Vitals:   10/27/15 1100  BP: 137/75  Pulse: 67  Height: 5' 3.5" (1.613 m)  Weight: 235 lb 6.4 oz (106.777 kg)   Body mass index is 41.04 kg/(m^2).  MMSE - Mini Mental State Exam 10/27/2015 05/04/2015  Orientation to time 0 2  Orientation to Place 3 3  Registration 3 3  Attention/ Calculation 3 5  Recall 2 0  Language- name 2 objects 2 2  Language- repeat 1 0  Language- follow 3 step command 3 3  Language- read & follow direction 1 1  Write a sentence 1 1  Copy design 1 1  Total score 20 21     Generalized: Well developed, in no acute distress, obese  Neurological examination  Mentation: Alert. Follows all commands speech and language fluent.  Cranial nerve II-XII: Pupils were equal round reactive to light. Extraocular movements were full, visual field  were full on confrontational test. Facial sensation and strength were normal. Uvula tongue midline. Head turning and shoulder shrug  were normal and symmetric. Motor: The motor testing reveals 5 over 5 strength of all 4 extremities. Good symmetric motor tone is noted throughout.  Sensory: Sensory testing is intact to soft touch on all 4 extremities. No evidence of extinction is noted.  Coordination: Cerebellar testing reveals good finger-nose-finger and heel-to-shin bilaterally.  Gait and station: Gait is slightly unsteady. Tandem gait not attempted. Romberg is negative Reflexes: Deep tendon reflexes are symmetric and normal bilaterally.   DIAGNOSTIC DATA (LABS, IMAGING, TESTING) - I reviewed patient records, labs, notes, testing and imaging myself where available.   ASSESSMENT AND PLAN 82 y.o. year old female  has a past medical history of Hypertension; Diabetes mellitus without complication ([place]); Dementia; Back pain, chronic; Anxiety; Recurrent cystitis; Arthritis; Hypercholesteremia; Cancer ([place]); Complex endometrial hyperplasia (2004); [id number]) (10/29/2013); Obese; OSA on CPAP; Depression (04/30/2014); and Chronic low back pain (04/30/2014). here with:  1. Dementia 2. Chronic low back pain  The patient's memory score has remained stable. Her MMSE today is 20/30 was previously 21/30. She will continue on Aricept 10 mg daily as well as Vayacog. Patient is encouraged to continue with physical therapy as well as doing those exercises at home. If her symptoms worsen or she develops any new symptoms she should let us know. She will follow-up in 6 months with Dr. Jannifer Franklin.     Ward Givens, MSN, NP-C 10/27/2015, 11:36 AM [facility] [address] [place], [place] [address] [phone]

## 2015-11-02 ENCOUNTER — Ambulatory Visit: Payer: Medicare Other | Admitting: Adult Health

## 2015-11-09 ENCOUNTER — Telehealth: Payer: Self-pay | Admitting: Neurology

## 2015-11-09 MED ORDER — DESVENLAFAXINE SUCCINATE ER 100 MG PO TB24: 100.0000 mg | ORAL_TABLET | Freq: Every day | ORAL | Status: DC

## 2015-11-09 NOTE — Telephone Encounter (Signed)
Rx retailed as requested.

## 2015-11-09 NOTE — Telephone Encounter (Signed)
Spouse called to request refill of PRISTIQ 100 MG 24 hr tablet for 90 day supply, Product/process development scientist on [address].

## 2015-12-10 ENCOUNTER — Other Ambulatory Visit: Payer: Self-pay | Admitting: Neurology

## 2016-03-18 ENCOUNTER — Other Ambulatory Visit: Payer: Self-pay | Admitting: Neurology

## 2016-04-19 ENCOUNTER — Other Ambulatory Visit: Payer: Self-pay | Admitting: Internal Medicine

## 2016-04-19 ENCOUNTER — Ambulatory Visit
Admission: RE | Admit: 2016-04-19 | Discharge: 2016-04-19 | Disposition: A | Discharge status: Home / Self Care | Payer: Medicare Other | Source: Ambulatory Visit | Attending: Internal Medicine | Admitting: Internal Medicine

## 2016-04-19 DIAGNOSIS — G44301 Post-traumatic headache, unspecified, intractable: Secondary | ICD-10-CM

## 2016-04-22 ENCOUNTER — Telehealth: Payer: Self-pay | Admitting: Neurology

## 2016-04-22 NOTE — Telephone Encounter (Signed)
I called the husband. CT the head is relatively unremarkable. The patient has had some headaches in the occipital area following a fall, she did bump her head, but not severely, the headaches are not associated with any neck discomfort. Her memory has gradually changed over the last 6 months. We will see her next week.   CT head 04/19/16:  IMPRESSION: No skull fracture or intracranial hemorrhage.  Global atrophy.  Slightly polypoid opacification right sphenoid sinus. Moderate mucosal thickening left sphenoid sinus.

## 2016-04-22 NOTE — Telephone Encounter (Signed)
Spouse called, states wife has been having bad headaches and fell 4 weeks ago Saturday, not sure if fall was related to headaches, states long term memory has gotten worse, she asks same question right after he has just answered it. States he took wife to PCP who ordered a CT scan at [facility]. Husband wants to make sure Dr. Jannifer Franklin looks at this scan by the time wife comes in for appointment on October 16th.

## 2016-04-26 ENCOUNTER — Ambulatory Visit (INDEPENDENT_AMBULATORY_CARE_PROVIDER_SITE_OTHER): Payer: Medicare Other | Admitting: Obstetrics & Gynecology

## 2016-04-26 ENCOUNTER — Encounter: Payer: Self-pay | Admitting: Obstetrics & Gynecology

## 2016-04-26 VITALS — BP 134/64 | HR 96 | Resp 16 | Ht 63.5 in | Wt 230.0 lb

## 2016-04-26 DIAGNOSIS — Z01419 Encounter for gynecological examination (general) (routine) without abnormal findings: Secondary | ICD-10-CM

## 2016-04-26 DIAGNOSIS — Z0000 Encounter for general adult medical examination without abnormal findings: Secondary | ICD-10-CM

## 2016-04-26 NOTE — Progress Notes (Addendum)
82 y.o. G3P3 MarriedCaucasianF here for annual exam.  Pt reports she is doing well.  Husband reports memory issues have worsened.  On two medications for this.  Followed by Dr. Jannifer Franklin.  Still able to live at home.  Husband report three children are very helpful.  Denies vaginal bleeding.  She does have urinary urgency.  Husband states she will state "I need to go to the bathroom.  I need to go to the bathroom.  I need to go to the bathroom."  Then she will have incontinence.  This is frustrating to him.   Patient's last menstrual period was 07/18/1969 (approximate).          Sexually active: Yes.    The current method of family planning is status post hysterectomy.    Exercising: No.  The patient does not participate in regular exercise at present. Smoker:  no  Health Maintenance: Pap:  10/27/11 Neg  History of abnormal Pap:  no MMG:  09/11/15 BIRADS1:neg  Colonoscopy:  2010 polyp  BMD:   05/14/08 Normal  TDaP:  UTD with PCP Pneumonia vaccine(s):  06/2012  Zostavax:   W/ PCP Hep C testing: not indicated Screening Labs: PCP, Urine today: PCP   reports that she has never smoked. She has never used smokeless tobacco. She reports that she drinks alcohol. She reports that she does not use drugs.  Past Medical History:  Diagnosis Date  . Anxiety   . Arthritis   . Back pain, chronic    lumbar up to thoracic area  . Cancer [facility])    Breast cancer  . Chronic low back pain 04/30/2014  . Complex endometrial hyperplasia 2004   renal insufficiency  . Dementia   . Depression 04/30/2014  . Diabetes mellitus without complication ([place])   . Headache(784.0) 10/29/2013  . Hypercholesteremia   . Hypertension   . Obese   . OSA on CPAP   . Recurrent cystitis     Past Surgical History:  Procedure Laterality Date  . BREAST SURGERY    . CATARACT EXTRACTION, BILATERAL  09/2012  . CYSTOSCOPY  07/03/2012   Procedure: CYSTOSCOPY;  Surgeon: Reece Packer, MD;  Location: [facility];  Service: Urology;;   . [place] OF UTERUS  2004   polyp resection  . EXAMINATION UNDER ANESTHESIA  07/03/2012   Procedure: EXAM UNDER ANESTHESIA;  Surgeon: Reece Packer, MD;  Location: [facility];  Service: Urology;  Laterality: N/A;  . FOOT NEUROMA SURGERY    . TONSILLECTOMY  1962  . TUBAL LIGATION  1970  . VAGINAL HYSTERECTOMY  07/03/2012   Procedure: HYSTERECTOMY VAGINAL;  Surgeon: Peri Maris, MD;  Location: [facility];  Service: Gynecology;  Laterality: N/A;    Current Outpatient Prescriptions  Medication Sig Dispense Refill  . Cholecalciferol (VITAMIN D3) 1000 UNITS CAPS Take by mouth daily.    . Cinnamon 500 MG capsule Take 500 mg by mouth daily.    Marland Kitchen desvenlafaxine (PRISTIQ) 100 MG 24 hr tablet Take 1 tablet (100 mg total) by mouth daily. 90 tablet 3  . donepezil (ARICEPT) 10 MG tablet TAKE ONE TABLET BY MOUTH AT BEDTIME 90 tablet 3  . fenofibrate (TRICOR) 48 MG tablet Take 48 mg by mouth daily.    Marland Kitchen levothyroxine (SYNTHROID, LEVOTHROID) 112 MCG tablet Take 112 mcg by mouth daily before breakfast.     . LORazepam (ATIVAN) 1 MG tablet Take 0.5 tablets by mouth at bedtime as needed and may repeat dose one time if  needed.    Marland Kitchen losartan (COZAAR) 50 MG tablet Take 50 mg by mouth daily.    . Multiple Vitamins-Minerals (MULTIVITAMIN WITH MINERALS) tablet Take 1 tablet by mouth daily.    Marland Kitchen nystatin (MYCOSTATIN) powder Apply topically daily. Start using after skin has healed 15 g 2  . Omega-3 Fatty Acids (FISH OIL) 1000 MG CAPS Take 1 capsule by mouth 2 (two) times daily.    . Pyridoxine HCl (VITAMIN B6 PO) Take by mouth daily.    . traMADol (ULTRAM) 50 MG tablet Take 50 mg by mouth every 6 (six) hours as needed.     Marland Kitchen VAYACOG 100-19.5-6.5 MG CAPS TAKE ONE CAPSULE BY MOUTH ONCE DAILY 30 capsule 0   No current facility-administered medications for this visit.     Family History  Problem Relation Age of Onset  . Hypertension Mother   . Heart disease Mother   . Hypertension Father   .  Dementia Father   . Depression Sister     ROS:  Pertinent items are noted in HPI.  Otherwise, a comprehensive ROS was negative.  Exam:   BP 134/64 (BP Location: Right Arm, Patient Position: Sitting, Cuff Size: Large)   Pulse 96   Resp 16   Ht 5' 3.5" (1.613 m)   Wt 230 lb (104.3 kg)   LMP 07/18/1969 (Approximate)   BMI 40.10 kg/m   Weight change: -4#   Height: 5' 3.5" (161.3 cm)  Ht Readings from Last 3 Encounters:  04/26/16 5' 3.5" (1.613 m)  10/27/15 5' 3.5" (1.613 m)  05/04/15 5' 3.5" (1.613 m)   General appearance: alert, cooperative and appears stated age Head: Normocephalic, without obvious abnormality, atraumatic Neck: no adenopathy, supple, symmetrical, trachea midline and thyroid normal to inspection and palpation Lungs: clear to auscultation bilaterally Breasts: normal appearance, no masses or tenderness Heart: regular rate and rhythm Abdomen: soft, non-tender; bowel sounds normal; no masses,  no organomegaly Extremities: extremities normal, atraumatic, no cyanosis or edema Skin: Skin color, texture, turgor normal. No rashes or lesions Lymph nodes: Cervical, supraclavicular, and axillary nodes normal. No abnormal inguinal nodes palpated Neurologic: Grossly normal  Pelvic: External genitalia:  no lesions              Urethra:  normal appearing urethra with no masses, tenderness or lesions              Bartholins and Skenes: normal                 Vagina: normal appearing vagina with normal color and discharge, no lesions, second degree cystocele Cervix: absent              Pap taken: No. Bimanual Exam:  Uterus:  normal size, contour, position, consistency, mobility, non-tender              Adnexa: no mass, fullness, tenderness               Rectovaginal: Confirms               Anus:  normal sphincter tone, no lesions  Chaperone was present for exam.  A:     Well Woman with normal exam PMP, no HRT Dementia Chronic low back pain Hypertension Diabetes Elevated  lipids Cystocele with mixed incontinence Hypothyroidism  P:         Mammogram yearly.  D/w pt and spouse recommendations about yearly MMG.  They desire to continue pap smear not indicated Labs/vaccines done with Dr. Maudie Mercury  Pt  has appt with Dr. Maudie Mercury and Dr. Jannifer Franklin next year Return annually or prn

## 2016-05-02 ENCOUNTER — Ambulatory Visit (INDEPENDENT_AMBULATORY_CARE_PROVIDER_SITE_OTHER): Payer: Medicare Other | Admitting: Neurology

## 2016-05-02 ENCOUNTER — Encounter: Payer: Self-pay | Admitting: Neurology

## 2016-05-02 ENCOUNTER — Ambulatory Visit: Payer: Medicare Other | Admitting: Neurology

## 2016-05-02 VITALS — BP 169/85 | HR 89 | Ht 63.5 in | Wt 235.0 lb

## 2016-05-02 DIAGNOSIS — G44229 Chronic tension-type headache, not intractable: Secondary | ICD-10-CM | POA: Diagnosis not present

## 2016-05-02 DIAGNOSIS — M545 Low back pain: Secondary | ICD-10-CM

## 2016-05-02 DIAGNOSIS — R413 Other amnesia: Secondary | ICD-10-CM | POA: Diagnosis not present

## 2016-05-02 DIAGNOSIS — G8929 Other chronic pain: Secondary | ICD-10-CM

## 2016-05-02 NOTE — Progress Notes (Signed)
Reason for visit: Memory disorder  Kiara Nelson is an 82 y.o. female  History of present illness:  Kiara Nelson is an 82 year old right-handed white female with a history of a progressive memory disorder. The patient has continued to worsen with short-term memory. She has had chronic sinus type headaches that will clear with warm compresses and sinus drainage. The patient recently fell and bumped the top of her head, she has had some headaches on the top of the head since that time, a CT scan of the brain was unremarkable. The patient now has only soreness on the top of the head, no headaches otherwise. She has had some low back pain off and on. The patient is on Aricept and Vyacog. The husband does not believe that the Vyacog has been beneficial. She returns for an evaluation.  Past Medical History:  Diagnosis Date  . Anxiety   . Arthritis   . Back pain, chronic    lumbar up to thoracic area  . Cancer [facility])    Breast cancer  . Chronic low back pain 04/30/2014  . Complex endometrial hyperplasia 2004   renal insufficiency  . Dementia   . Depression 04/30/2014  . Diabetes mellitus without complication ([place])   . Headache(784.0) 10/29/2013  . Hypercholesteremia   . Hypertension   . Obese   . OSA on CPAP   . Recurrent cystitis     Past Surgical History:  Procedure Laterality Date  . BREAST SURGERY    . CATARACT EXTRACTION, BILATERAL  09/2012  . CYSTOSCOPY  07/03/2012   Procedure: CYSTOSCOPY;  Surgeon: Reece Packer, MD;  Location: [facility];  Service: Urology;;  . [place] OF UTERUS  2004   polyp resection  . EXAMINATION UNDER ANESTHESIA  07/03/2012   Procedure: EXAM UNDER ANESTHESIA;  Surgeon: Reece Packer, MD;  Location: [facility];  Service: Urology;  Laterality: N/A;  . FOOT NEUROMA SURGERY    . TONSILLECTOMY  1962  . TUBAL LIGATION  1970  . VAGINAL HYSTERECTOMY  07/03/2012   Procedure: HYSTERECTOMY VAGINAL;  Surgeon: Peri Maris, MD;  Location:  [facility];  Service: Gynecology;  Laterality: N/A;    Family History  Problem Relation Age of Onset  . Hypertension Mother   . Heart disease Mother   . Hypertension Father   . Dementia Father   . Depression Sister     Social history:  reports that she has never smoked. She has never used smokeless tobacco. She reports that she drinks alcohol. She reports that she does not use drugs.    Allergies  Allergen Reactions  . Bee Venom Swelling  . Penicillins Swelling    Swelling of hands  . Sulfa Antibiotics     Medications:  Prior to Admission medications   Medication Sig Start Date End Date Taking? Authorizing Provider  baclofen (LIORESAL) 10 MG tablet Take 10 mg by mouth as needed for muscle spasms.   Yes Historical Provider, MD  Cholecalciferol (VITAMIN D3) 1000 UNITS CAPS Take by mouth daily.   Yes Historical Provider, MD  Cinnamon 500 MG capsule Take 500 mg by mouth daily.   Yes Historical Provider, MD  desvenlafaxine (PRISTIQ) 100 MG 24 hr tablet Take 1 tablet (100 mg total) by mouth daily. 11/09/15  Yes Kathrynn Ducking, MD  donepezil (ARICEPT) 10 MG tablet TAKE ONE TABLET BY MOUTH AT BEDTIME 03/18/16  Yes Kathrynn Ducking, MD  fenofibrate (TRICOR) 48 MG tablet Take 48 mg by  mouth daily.   Yes Historical Provider, MD  HYDROCODONE-ACETAMINOPHEN PO Take 1 tablet by mouth as needed.   Yes Historical Provider, MD  levothyroxine (SYNTHROID, LEVOTHROID) 112 MCG tablet Take 112 mcg by mouth daily before breakfast.  10/02/15  Yes Historical Provider, MD  LORazepam (ATIVAN) 1 MG tablet Take 0.5 tablets by mouth at bedtime as needed and may repeat dose one time if needed. 04/09/15  Yes Historical Provider, MD  losartan (COZAAR) 50 MG tablet Take 50 mg by mouth daily.   Yes Historical Provider, MD  Multiple Vitamins-Minerals (MULTIVITAMIN WITH MINERALS) tablet Take 1 tablet by mouth daily.   Yes Historical Provider, MD  nystatin (MYCOSTATIN) powder Apply topically daily. Start using after skin has  healed 04/09/15  Yes Megan Salon, MD  Omega-3 Fatty Acids (FISH OIL) 1000 MG CAPS Take 1 capsule by mouth 2 (two) times daily.   Yes Historical Provider, MD  Pyridoxine HCl (VITAMIN B6 PO) Take by mouth daily.   Yes Historical Provider, MD  traMADol (ULTRAM) 50 MG tablet Take 50 mg by mouth every 6 (six) hours as needed.  02/25/15  Yes Historical Provider, MD  VAYACOG 100-19.5-6.5 MG CAPS TAKE ONE CAPSULE BY MOUTH ONCE DAILY 12/10/15  Yes Kathrynn Ducking, MD    ROS:  Out of a complete 14 system review of symptoms, the patient complains only of the following symptoms, and all other reviewed systems are negative.  Ringing in the ears Excessive sweating Bruising easily Memory loss, headache Decreased concentration, depression Low back pain Sleep apnea  Blood pressure (!) 169/85, pulse 89, height 5' 3.5" (1.613 m), weight 235 lb (106.6 kg), last menstrual period 07/18/1969.  Physical Exam  General: The patient is alert and cooperative at the time of the examination. The patient is markedly obese.  Skin: No significant peripheral edema is noted.   Neurologic Exam  Mental status: The patient is alert and oriented x 2 at the time of the examination (not oriented to date). The Mini-Mental Status Examination done today shows a total score of 20/30.   Cranial nerves: Facial symmetry is present. Speech is normal, no aphasia or dysarthria is noted. Extraocular movements are full. Visual fields are full.  Motor: The patient has good strength in all 4 extremities.  Sensory examination: Soft touch sensation is symmetric on the face, arms, and legs.  Coordination: The patient has good finger-nose-finger and heel-to-shin bilaterally.  Gait and station: The patient has a normal gait. Tandem gait was not tested. Romberg is negative. No drift is seen.  Reflexes: Deep tendon reflexes are symmetric.   CT head 04/19/16:  IMPRESSION: No skull fracture or intracranial hemorrhage.  Global  atrophy.  Slightly polypoid opacification right sphenoid sinus. Moderate mucosal thickening left sphenoid sinus.  * CT scan images were reviewed online. I agree with the written report.    Assessment/Plan:  1. Progressive memory disorder  2. Intermittent headache  3. Chronic low back pain  The patient has had improvement in headaches since her recent fall. CT of the head was unremarkable. The patient will be going on baclofen taking 5 mg twice daily. The patient will stop the Vyacog. She will follow-up in 6 months.  Jill Alexanders MD 05/02/2016 9:10 AM  [facility] [address] [place], [place] [address]  Phone [phone] Fax [phone]

## 2016-08-05 ENCOUNTER — Other Ambulatory Visit: Payer: Self-pay | Admitting: Internal Medicine

## 2016-08-05 DIAGNOSIS — Z1231 Encounter for screening mammogram for malignant neoplasm of breast: Secondary | ICD-10-CM

## 2016-09-13 ENCOUNTER — Ambulatory Visit
Admission: RE | Admit: 2016-09-13 | Discharge: 2016-09-13 | Disposition: A | Discharge status: Home / Self Care | Payer: Medicare Other | Source: Ambulatory Visit | Attending: Internal Medicine | Admitting: Internal Medicine

## 2016-09-13 DIAGNOSIS — Z1231 Encounter for screening mammogram for malignant neoplasm of breast: Secondary | ICD-10-CM

## 2016-09-15 ENCOUNTER — Other Ambulatory Visit: Payer: Self-pay | Admitting: Internal Medicine

## 2016-09-15 DIAGNOSIS — R928 Other abnormal and inconclusive findings on diagnostic imaging of breast: Secondary | ICD-10-CM

## 2016-09-21 ENCOUNTER — Other Ambulatory Visit: Payer: Self-pay | Admitting: Internal Medicine

## 2016-09-21 ENCOUNTER — Ambulatory Visit
Admission: RE | Admit: 2016-09-21 | Discharge: 2016-09-21 | Disposition: A | Discharge status: Home / Self Care | Payer: Medicare Other | Source: Ambulatory Visit | Attending: Internal Medicine | Admitting: Internal Medicine

## 2016-09-21 DIAGNOSIS — R921 Mammographic calcification found on diagnostic imaging of breast: Secondary | ICD-10-CM

## 2016-09-21 DIAGNOSIS — R928 Other abnormal and inconclusive findings on diagnostic imaging of breast: Secondary | ICD-10-CM

## 2016-09-23 ENCOUNTER — Ambulatory Visit
Admission: RE | Admit: 2016-09-23 | Discharge: 2016-09-23 | Disposition: A | Discharge status: Home / Self Care | Payer: Medicare Other | Source: Ambulatory Visit | Attending: Internal Medicine | Admitting: Internal Medicine

## 2016-09-23 DIAGNOSIS — R928 Other abnormal and inconclusive findings on diagnostic imaging of breast: Secondary | ICD-10-CM

## 2016-09-23 DIAGNOSIS — R921 Mammographic calcification found on diagnostic imaging of breast: Secondary | ICD-10-CM

## 2016-09-30 ENCOUNTER — Ambulatory Visit: Payer: Self-pay | Admitting: Surgery

## 2016-09-30 NOTE — H&P (Signed)
Kiara Nelson 09/30/2016 9:49 AM Location: [facility] Patient #: [id number] DOB: [birth year] Married / Language: English / Race: White Female  History of Present Illness Marcello Moores A. Breunna Nordmann MD; 09/30/2016 12:03 PM) Patient words: Patient sent at the request of Dr. Lovey Newcomer due to an abnormal left breast mammogram. Patient has a history of left breast cancer dating back 2006 when she underwent a left breast lumpectomy with sentinel lymph no mapping by Dr. Marylene Buerger. The details of that are not readily available at this point in time. In any event, she underwent postoperative radiation therapy and was maintained on anastrozole. She has not been followed for this since 2010. She underwent a screening mammogram and a large cluster of microcalcifications and abnormality were noted in the left upper central breast measuring 7 cm. 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 Patient denies any problem with her breast prior to this. She does have Alzheimer's and does suffer from dementia. Her daughter and husband are with her today.                    CLINICAL DATA: History of left breast cancer status post lumpectomy in 2006. History of a benign left breast stereotactic biopsy in 2007. EXAM: 2D DIGITAL DIAGNOSTIC LEFT MAMMOGRAM WITH ADJUNCT TOMO ULTRASOUND LEFT BREAST COMPARISON: Previous exam(s). ACR Breast Density Category c: The breast tissue is heterogeneously dense, which may obscure small masses. FINDINGS: Additional imaging of the left breast was performed. There is 1.1 cm asymmetry with distortion in the 12 o'clock region of the left breast. Punctate developing calcifications are associated with this area and extend towards the nipple spanning an area of 6.9 cm. On physical exam, I do not palpate a mass in the 12 o'clock region of the left breast. Targeted ultrasound is performed, showing no sonographic correlate with the area of  distortion and asymmetry in the 12 o'clock region of the left breast. Sonographic evaluation the left axilla does not show any enlarged adenopathy. IMPRESSION: Suspicious asymmetry in distortion the 12 o'clock region of the left breast as well as suspicious calcifications. RECOMMENDATION: Stereotactic biopsy of the left breast asymmetry and distortion is recommended. Stereotactic biopsy of the anterior extent of the calcifications is recommended. The biopsies will be scheduled the patient's convenience. I have discussed the findings and recommendations with the patient. Results were also provided in writing at the conclusion of the visit. If applicable, a reminder letter will be sent to the patient regarding the next appointment. BI-RADS CATEGORY 4: Suspicious. Electronically Signed By: Lillia Mountain M.D. On: 09/21/2016 13:14  Result History  MM DIAG BREAST TOMO UNI LEFT (Order #[id number]) on 09/21/2016 - Order Result History Report <[email or web address]>  US BREAST LTD UNI LEFT INC AXILLA (Order [id number]) - Reflex for Order [id number] Study Result  CLINICAL DATA: History of left breast cancer status post lumpectomy in 2006. History of a benign left breast stereotactic biopsy in 2007. EXAM: 2D DIGITAL DIAGNOSTIC LEFT MAMMOGRAM WITH ADJUNCT TOMO ULTRASOUND LEFT BREAST COMPARISON: Previous exam(s). ACR Breast Density Category c: The breast tissue is heterogeneously dense, which may obscure small masses. FINDINGS: Additional imaging of the left breast was performed. There is 1.1 cm asymmetry with distortion in the 12 o'clock region of the left breast. Punctate developing calcifications are associated with this area and extend towards the nipple spanning an area of 6.9 cm. On physical exam, I do not palpate a mass in the 12 o'clock region of the left breast. Targeted ultrasound is performed,  showing no sonographic correlate with the area of distortion and asymmetry in the 12  o'clock region of the left breast. Sonographic evaluation the left axilla does not show any enlarged adenopathy. IMPRESSION: Suspicious asymmetry in distortion the 12 o'clock region of the left breast as well as suspicious calcifications. RECOMMENDATION: Stereotactic biopsy of the left breast asymmetry and distortion is recommended. Stereotactic biopsy of the anterior extent of the calcifications is recommended. The biopsies will be scheduled the patient's convenience. I have discussed the findings and recommendations with the patient. Results were also provided in writing at the conclusion of the visit. If applicable, a reminder letter will be sent to the patient regarding the next appointment. BI-RADS CATEGORY 4: Suspicious. Electronically Signed By: Lillia Mountain M.D. On: 09/21/2016 13:14  Result History   Study Result  CLINICAL DATA: Screening. History of left lumpectomy with radiation therapy in 2006. EXAM: 2D DIGITAL SCREENING BILATERAL MAMMOGRAM WITH CAD AND ADJUNCT TOMO COMPARISON: Previous exam(s). ACR Breast Density Category c: The breast tissue is heterogeneously dense, which may obscure small masses. FINDINGS: Post operative changes are seen in the lower inner quadrant of the leftbreast. In the upper central portion of the left breast, a possible mass warrants further evaluation. In the right breast, no findings suspicious for malignancy. Images were processed with CAD. IMPRESSION: Further evaluation is suggested for possible mass in the left breast. RECOMMENDATION: Diagnostic mammogram and possibly ultrasound of the left breast. (Code:[id number]) The patient will be contacted regarding the findings, and additional imaging will be scheduled. BI-RADS CATEGORY 0: Incomplete. Need additional imaging evaluation and/or prior mammograms for comparison. Electronically Signed By: Nolon Nations M.D. On: 09/14/2016 09:48              ADDITIONAL  INFORMATION: 1. FLUORESCENCE IN-SITU HYBRIDIZATION Results: HER2 - NEGATIVE RATIO OF HER2/CEP17 SIGNALS 1.13 AVERAGE HER2 COPY NUMBER PER CELL 1.75 Reference Range: NEGATIVE HER2/CEP17 Ratio <2.0 and average HER2 copy number <4.0 EQUIVOCAL HER2/CEP17 Ratio <2.0 and average HER2 copy number >=4.0 and <6.0 POSITIVE HER2/CEP17 Ratio >=2.0 or <2.0 and average HER2 copy number >=6.0 Vicente Males MD Pathologist, Electronic Signature ( Signed 09/29/2016) 1. PROGNOSTIC INDICATORS Results: IMMUNOHISTOCHEMICAL AND MORPHOMETRIC ANALYSIS PERFORMED MANUALLY Estrogen Receptor: 100%, POSITIVE, STRONG STAINING INTENSITY Progesterone Receptor: 20%, POSITIVE, STRONG STAINING INTENSITY Proliferation Marker Ki67: 5% REFERENCE RANGE ESTROGEN RECEPTOR NEGATIVE 0% POSITIVE =>1% REFERENCE RANGE PROGESTERONE RECEPTOR NEGATIVE 0% POSITIVE =>1% 1 of 3 FINAL for LEONELA, KIVI [phone]) ADDITIONAL INFORMATION:(continued) All controls stained appropriately Vicente Males MD Pathologist, Electronic Signature ( Signed 09/27/2016) FINAL DIAGNOSIS Diagnosis 1. Breast, left, needle core biopsy, distortion 12 o'clock - INVASIVE DUCTAL CARCINOMA, GRADE 1, AND DUCTAL CARCINOMA IN SITU, INVOLVING MULTIPLE CORES, MEASURING UP TO 7 MM IN MAXIMAL LINEAR DIMENSION. - OCCASIONAL CALCIFICATIONS IDENTIFIED. - A BREAST PROGNOSTIC PANEL WILL BE ORDERED ON BLOCK 1A AND SEPARATELY REPORTED. - SEE COMMENT. 2. Breast, left, needle core biopsy, calcs anterior superior - DUCTAL CARCINOMA IN SITU, LOW TO INTERMEDIATE GRADE WITH FOCI OF COMEDO NECROSIS. - OCCASIONAL CALCIFICATIONS IDENTIFIED. - NO INVASIVE CARCINOMA IDENTIFIED. - SEE COMMENT. Microscopic Comment 1. , 2. Internal departmental review obtained (Dr. Saralyn Pilar) with agreement. Results are phoned to Dr. Radford Pax on 09/26/16. (MEG:kh 09/26/16) Haywood Lasso MD Pathologist, Electronic Signature (Case signed 09/26/2016) Specimen Gross and Clinical  Information Specimen Comment 1. In formalin 12:30; extracted < 5 min; distortion site 2. In formalin 1230; extracted < 5 min; calcs Specimen(s) Obtained: 1. Breast, left, needle core biopsy, distortion 12 o'clock 2. Breast, left, needle core biopsy,  calcs anterior superior Specimen Clinical Information 1. Poss cancer vs CSL vs radial scar 2. Poss FCC, FA type vs DCIS Gross 1. Received in formalin, labeled with patients name and site, Reita Cliche, left breast distortion 12:00 o'clock #1, (TIF 1230 CIT < 5 min) , are multiple pieces of irregular cores of yellow to gray white soft tissue, aggregating 2.7 x 2.5 x 0.5 cm. Pieces clinically having microcalcifications are submitted.  The patient is a 83 year old female.   Past Surgical History Nance Pear, [place]; 09/30/2016 9:49 AM) Breast Biopsy Left. Breast Mass; Local Excision Left. Carotid Artery Surgery Bilateral. Cataract Surgery Bilateral. Cesarean Section - Multiple Foot Surgery Right. Hysterectomy (not due to cancer) - Complete Oral Surgery  Diagnostic Studies History Nance Pear, [place]; 09/30/2016 9:49 AM) Colonoscopy 1-5 years ago Mammogram within last year Pap Smear 1-5 years ago  Allergies Nance Pear, CMA; 09/30/2016 9:49 AM) Penicillin G Benzathine & Proc *PENICILLINS* Swelling. Allergies Reconciled  Medication History Nance Pear, CMA; 09/30/2016 9:51 AM) Donepezil HCl (10MG Tablet, Oral daily) Active. Topiramate (25MG Tablet, Oral daily) Active. Losartan Potassium (50MG Tablet, Oral daily) Active. Levothyroxine Sodium (100MCG Tablet, Oral daily) Active. Fenofibrate (48MG Tablet, Oral daily) Active. Levothyroxine Sodium (112MCG Tablet, Oral daily) Active. Desvenlafaxine Succinate ER (100MG Tablet ER 24HR, Oral daily) Active. Omega 3 (1000MG Capsule, Oral daily) Active. Multi-Minerals (Oral daily) Active. B6 Natural (100MG Tablet, Oral daily) Active. D3-1000 (1000UNIT Capsule,  Oral daily) Active. Medications Reconciled  Social History Nance Pear, [place]; 09/30/2016 9:49 AM) Caffeine use Coffee, Tea. Tobacco use Never smoker.  Family History Nance Pear, [place]; 09/30/2016 9:49 AM) Breast Cancer Family Members In General. Colon Polyps Father. Heart Disease Mother. Hypertension Mother.  Pregnancy / Birth History Nance Pear, [place]; 09/30/2016 9:49 AM) Age at menarche 45 years. Age of menopause 24-50 Gravida 3 Irregular periods Length (months) of breastfeeding 7-12 Para 3  Other Problems Nance Pear, [place]; 09/30/2016 9:49 AM) Anxiety Disorder Back Pain Breast Cancer Depression Sleep Apnea     Review of Systems Nance Pear CMA; 09/30/2016 9:49 AM) General Not Present- Appetite Loss, Chills, Fatigue, Fever, Night Sweats, Weight Gain and Weight Loss. Skin Not Present- Change in Wart/Mole, Dryness, Hives, Jaundice, New Lesions, Non-Healing Wounds, Rash and Ulcer. HEENT Present- Ringing in the Ears and Wears glasses/contact lenses. Not Present- Earache, Hearing Loss, Hoarseness, Nose Bleed, Oral Ulcers, Seasonal Allergies, Sinus Pain, Sore Throat, Visual Disturbances and Yellow Eyes. Respiratory Not Present- Bloody sputum, Chronic Cough, Difficulty Breathing, Snoring and Wheezing. Breast Not Present- Breast Mass, Breast Pain, Nipple Discharge and Skin Changes. Cardiovascular Not Present- Chest Pain, Difficulty Breathing Lying Down, Leg Cramps, Palpitations, Rapid Heart Rate, Shortness of Breath and Swelling of Extremities. Gastrointestinal Not Present- Abdominal Pain, Bloating, Bloody Stool, Change in Bowel Habits, Chronic diarrhea, Constipation, Difficulty Swallowing, Excessive gas, Gets full quickly at meals, Hemorrhoids, Indigestion, Nausea, Rectal Pain and Vomiting. Female Genitourinary Not Present- Frequency, Nocturia, Painful Urination, Pelvic Pain and Urgency. Musculoskeletal Present- Back Pain. Not Present- Joint Pain, Joint  Stiffness, Muscle Pain, Muscle Weakness and Swelling of Extremities. Neurological Present- Decreased Memory. Not Present- Fainting, Headaches, Numbness, Seizures, Tingling, Tremor, Trouble walking and Weakness. Psychiatric Present- Anxiety and Depression. Not Present- Bipolar, Change in Sleep Pattern, Fearful and Frequent crying. Endocrine Not Present- Cold Intolerance, Excessive Hunger, Hair Changes, Heat Intolerance, Hot flashes and New Diabetes. Hematology Not Present- Blood Thinners, Easy Bruising, Excessive bleeding, Gland problems, HIV and Persistent Infections.  Vitals Bary Castilla Bradford CMA; 09/30/2016 9:51 AM) 09/30/2016 9:51 AM Weight: 233.2 lb  Height: 64in Body Surface Area: 2.09 m Body Mass Index: 40.03 kg/m  Temp.: 98.8F  Pulse: 82 (Regular)  BP: 142/92 (Sitting, Left Arm, Standard)      Physical Exam (Kendall Justo A. Madissen Wyse MD; 09/30/2016 12:08 PM)  General Mental Status-Alert. General Appearance-Consistent with stated age. Hydration-Well hydrated. Voice-Normal.  Head and Neck Head-normocephalic, atraumatic with no lesions or palpable masses. Trachea-midline. Thyroid Gland Characteristics - normal size and consistency.  Eye Eyeball - Bilateral-Extraocular movements intact. Sclera/Conjunctiva - Bilateral-No scleral icterus.  Chest and Lung Exam Chest and lung exam reveals -quiet, even and easy respiratory effort with no use of accessory muscles and on auscultation, normal breath sounds, no adventitious sounds and normal vocal resonance. Inspection Chest Wall - Normal. Back - normal.  Breast Note: Left breast smaller than right. Bruising of left breast noted in the central upper breast. Previous incisions from lumpectomy and sentinel lymph node mapping noted. Right breast is normal.  Cardiovascular Cardiovascular examination reveals -normal heart sounds, regular rate and rhythm with no murmurs and normal pedal pulses  bilaterally.  Neuropsychiatric Note: Very repetitive. He cannot remember what is said to her short-term. Ask the same question over and over.  Musculoskeletal Normal Exam - Left-Upper Extremity Strength Normal and Lower Extremity Strength Normal. Normal Exam - Right-Upper Extremity Strength Normal and Lower Extremity Strength Normal.  Lymphatic Head & Neck  General Head & Neck Lymphatics: Bilateral - Description - Normal. Axillary  General Axillary Region: Bilateral - Description - Normal. Tenderness - Non Tender.    Assessment & Plan (Adelynne Joerger A. Liela Rylee MD; 09/30/2016 12:09 PM)  BREAST CANCER, LEFT (C50.912) Impression: The patient's only option is left simple mastectomy. Discussed the role of lymph node removal as well with her. Risks, benefits and old chart is discussed. This is a low-grade tumor with a large area of DCIS as well. Cc had previous radiation therapy lumpectomy unlikely in this circumstance especially given the large area and reduce breast size and the left. Discussed treatment options for breast cancer to include breast conservation vs mastectomy with reconstruction. Pt has decided on mastectomy. Risk include bleeding, infection, flap necrosis, pain, numbness, recurrence, hematoma, other surgery needs. Pt understands and agrees to proceed.  Current Plans Referred to Oncology, for evaluation and follow up (Oncology). Routine. You are being scheduled for surgery- Our schedulers will call you.  You should hear from our office's scheduling department within 5 working days about the location, date, and time of surgery. We try to make accommodations for patient's preferences in scheduling surgery, but sometimes the OR schedule or the surgeon's schedule prevents [place] from making those accommodations.  If you have not heard from our office [phone]) in 5 working days, call the office and ask for your surgeon's nurse.  If you have other questions about your  diagnosis, plan, or surgery, call the office and ask for your surgeon's nurse.  Pt Education - [facility] Breast Cancer Information Given - [facility] "Breast Journey" Package We discussed the staging and pathophysiology of breast cancer. We discussed all of the different options for treatment for breast cancer including surgery, chemotherapy, radiation therapy, Herceptin, and antiestrogen therapy. We discussed a sentinel lymph node biopsy as she does not appear to having lymph node involvement right now. We discussed the performance of that with injection of radioactive tracer and blue dye. We discussed that she would have an incision underneath her axillary hairline. We discussed that there is a bout a 10-20% chance of having a positive node with a sentinel lymph  node biopsy and we will await the permanent pathology to make any other first further decisions in terms of her treatment. One of these options might be to return to the operating room to perform an axillary lymph node dissection. We discussed about a 1-2% risk lifetime of chronic shoulder pain as well as lymphedema associated with a sentinel lymph node biopsy. We discussed the options for treatment of the breast cancer which included lumpectomy versus a mastectomy. We discussed the performance of the lumpectomy with a wire placement. We discussed a 10-20% chance of a positive margin requiring reexcision in the operating room. We also discussed that she may need radiation therapy or antiestrogen therapy or both if she undergoes lumpectomy. We discussed the mastectomy and the postoperative care for that as well. We discussed that there is no difference in her survival whether she undergoes lumpectomy with radiation therapy or antiestrogen therapy versus a mastectomy. There is a slight difference in the local recurrence rate being 3-5% with lumpectomy and about 1% with a mastectomy. We discussed the risks of operation including bleeding, infection, possible  reoperation. She understands her further therapy will be based on what her stages at the time of her operation.  Pt Education - [facility] breast cancer surgery: discussed with patient and provided information. Pt Education - [facility] Mastectomy HCI

## 2016-10-04 ENCOUNTER — Telehealth: Payer: Self-pay | Admitting: Hematology

## 2016-10-04 ENCOUNTER — Encounter: Payer: Self-pay | Admitting: Surgery

## 2016-10-04 NOTE — Telephone Encounter (Signed)
Tc to the pt to schedule an appt. Appt has been scheduled for the pt to see Dr. Burr Medico on 3/27 at 11am. Pt aware to arrive 30 minutes early. Voiced understanding. Letter mailed.

## 2016-10-07 ENCOUNTER — Inpatient Hospital Stay (HOSPITAL_COMMUNITY): Admission: RE | Admit: 2016-10-07 | Payer: Medicare Other | Source: Ambulatory Visit

## 2016-10-11 ENCOUNTER — Ambulatory Visit: Payer: Medicare Other | Admitting: Hematology

## 2016-10-13 ENCOUNTER — Other Ambulatory Visit: Payer: Self-pay | Admitting: Adult Health

## 2016-10-13 ENCOUNTER — Ambulatory Visit (HOSPITAL_BASED_OUTPATIENT_CLINIC_OR_DEPARTMENT_OTHER): Payer: Medicare Other | Admitting: Oncology

## 2016-10-13 ENCOUNTER — Ambulatory Visit: Admit: 2016-10-13 | Payer: Medicare Other | Admitting: Surgery

## 2016-10-13 ENCOUNTER — Other Ambulatory Visit: Payer: Medicare Other

## 2016-10-13 ENCOUNTER — Encounter (INDEPENDENT_AMBULATORY_CARE_PROVIDER_SITE_OTHER): Payer: Self-pay

## 2016-10-13 DIAGNOSIS — Z170 Estrogen receptor positive status [ER+]: Secondary | ICD-10-CM

## 2016-10-13 DIAGNOSIS — F0390 Unspecified dementia without behavioral disturbance: Secondary | ICD-10-CM

## 2016-10-13 DIAGNOSIS — C50812 Malignant neoplasm of overlapping sites of left female breast: Secondary | ICD-10-CM | POA: Diagnosis not present

## 2016-10-13 DIAGNOSIS — C50312 Malignant neoplasm of lower-inner quadrant of left female breast: Secondary | ICD-10-CM | POA: Insufficient documentation

## 2016-10-13 SURGERY — MASTECTOMY, SIMPLE
Anesthesia: General | Site: Breast | Laterality: Left

## 2016-10-13 MED ORDER — LETROZOLE 2.5 MG PO TABS: 2.5000 mg | ORAL_TABLET | Freq: Every day | ORAL | Status: DC

## 2016-10-13 NOTE — Progress Notes (Signed)
[place]  [phone] [phone]     ID: [name] DOB: [birth year]  MR#: [id number]  BWL#:[id number]  Patient Care Team: Jani Gravel, MD as PCP - General (Internal Medicine) Erroll Luna, MD as Consulting Physician (General Surgery) Chauncey Cruel, MD as Consulting Physician (Oncology) Chauncey Cruel, MD OTHER MD:  CHIEF COMPLAINT: Recurrent breast cancer in previously irradiated breast  CURRENT TREATMENT: Neoadjuvant anastrozole   BREAST CANCER HISTORY: "Lib" has a history of remote breast cancer, status post left lumpectomy in 2006 treated with adjuvant radiation then anti-estrogens briefly, discontinued by the patient because of sciatica concerns. I was her medical oncologist at that time. Marylene Buerger was her surgeon  More recently, on 09/13/2016 she had bilateral screening mammography with tomography at the [facility]. There were postoperative changes in the lower inner quadrant of the left breast. However in the upper central left breast was a possible mass and the patient was brought back on 09/21/2016 for unilateral left diagnostic mammography with tomography and left breast ultrasonography. The breast density was category C. At the 12:00 region of the left breast there was a 1.1 cm area of asymmetry associated with punctate calcifications. This was not palpable by exam. Ultrasound found no correlate. The left axilla was sonographically benign.  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 The invasive tumor was estrogen receptor positive at 100%, progesterone receptor positive at 20%, both with strong staining intensity, with an MIB-1 of 5%, and no HER-2 amplification, the signals ratio being 1.13 and the number per  cell 1.75 area  The patient's subsequent history is as detailed below.  INTERVAL HISTORY: Lib was evaluated in the breast clinic 10/13/2016 accompanied by her husband  and oldest daughter. Her case was also presented in the multidisciplinary breast cancer conference 09/07/2016. At that time a preliminary plan was proposed: It was noted that the patient had a 6.9 cm area of calcification consistent with ductal carcinoma in situ. An aromatase inhibitor was recommended.  REVIEW OF SYSTEMS: There were no specific symptoms leading to the original mammogram, which was routinely scheduled. The patient denies unusual headaches, visual changes, nausea, vomiting, stiff neck, dizziness, or gait imbalance. There has been no cough, phlegm production, or pleurisy, no chest pain or pressure, and no change in bowel or bladder habits. The patient denies fever, rash, bleeding, unexplained fatigue or unexplained weight loss. The patient's main problem is dementia, which is moderate to severe but does not yet impact her social interactions and activities of daily living. She can "do for herself" in most things although at this point her husband does the cooking, most of the housework, all the driving, and generally "I do what he says". A detailed review of systems was otherwise entirely negative.   PAST MEDICAL HISTORY: Past Medical History:  Diagnosis Date  . Anxiety   . Arthritis   . Back pain, chronic    lumbar up to thoracic area  . Cancer [facility])    Breast cancer  . Chronic low back pain 04/30/2014  . Complex endometrial hyperplasia 2004   renal insufficiency  . Dementia   . Depression 04/30/2014  . Diabetes mellitus without complication ([place])   . Headache(784.0) 10/29/2013  . Hypercholesteremia   . Hypertension   . Obese   . OSA  on CPAP   . Recurrent cystitis     PAST SURGICAL HISTORY: Past Surgical History:  Procedure Laterality Date  . BREAST SURGERY    . CATARACT EXTRACTION, BILATERAL  09/2012   . CYSTOSCOPY  07/03/2012   Procedure: CYSTOSCOPY;  Surgeon: Reece Packer, MD;  Location: [facility];  Service: Urology;;  . [place] OF UTERUS  2004   polyp resection  . EXAMINATION UNDER ANESTHESIA  07/03/2012   Procedure: EXAM UNDER ANESTHESIA;  Surgeon: Reece Packer, MD;  Location: [facility];  Service: Urology;  Laterality: N/A;  . FOOT NEUROMA SURGERY    . TONSILLECTOMY  1962  . TUBAL LIGATION  1970  . VAGINAL HYSTERECTOMY  07/03/2012   Procedure: HYSTERECTOMY VAGINAL;  Surgeon: Peri Maris, MD;  Location: [facility];  Service: Gynecology;  Laterality: N/A;    FAMILY HISTORY Family History  Problem Relation Age of Onset  . Hypertension Mother   . Heart disease Mother   . Hypertension Father   . Dementia Father   . Depression Sister   The patient's parents died from causes unrelated to cancer. The patient has one sister, no brothers. A maternal grandmother and a maternal aunt were diagnosed with breast cancers in their 86s. There is no other history of breast cancer in the family and no ovarian cancer history to the family's knowledge   GYNECOLOGIC HISTORY:  Patient's last menstrual period was 07/18/1969 (approximate).  menarche age 36, first live birth age 29. The patient is GX P3. She never took hormone replacement. 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 Their oldest daughter lives in [place] and works as a Herbalist. Son Harrie Jeans lives in [place] and works for [employer]. Daughter Levander Campion lives in [place] and works in Engineer, technical sales. The patient has 4 grandsons and one daughter, granddaughter graduating from [employer] in nursing school this year. The patient attend a local [place]: Not in place   HEALTH MAINTENANCE: Social History  Substance Use Topics  .  Smoking status: Never Smoker  . Smokeless tobacco: Never Used  . Alcohol use 0.0 oz/week     Comment: rarely     Colonoscopy:  PAP:  Bone density:   Allergies  Allergen Reactions  . Bee Venom Swelling  . Penicillins Swelling    Swelling of hands  . Sulfa Antibiotics     Current Outpatient Prescriptions  Medication Sig Dispense Refill  . Cholecalciferol (VITAMIN D3) 1000 UNITS CAPS Take by mouth daily.    Marland Kitchen desvenlafaxine (PRISTIQ) 100 MG 24 hr tablet Take 1 tablet (100 mg total) by mouth daily. 90 tablet 3  . donepezil (ARICEPT) 10 MG tablet TAKE ONE TABLET BY MOUTH AT BEDTIME 90 tablet 3  . fenofibrate (TRICOR) 48 MG tablet Take 48 mg by mouth daily.    Marland Kitchen HYDROCODONE-ACETAMINOPHEN PO Take 1 tablet by mouth as needed.    Marland Kitchen levothyroxine (SYNTHROID, LEVOTHROID) 100 MCG tablet Take 100 mcg by mouth daily before breakfast.    . levothyroxine (SYNTHROID, LEVOTHROID) 112 MCG tablet Take 112 mcg by mouth daily before breakfast.     . LORazepam (ATIVAN) 1 MG tablet Take 0.5 tablets by mouth at bedtime as needed and may repeat dose one time if needed.    Marland Kitchen losartan (COZAAR) 50 MG tablet Take 50 mg by mouth daily.    Marland Kitchen  Multiple Vitamins-Minerals (MULTIVITAMIN WITH MINERALS) tablet Take 1 tablet by mouth daily.    . Omega-3 Fatty Acids (FISH OIL) 1000 MG CAPS Take 1 capsule by mouth 2 (two) times daily.    . Pyridoxine HCl (VITAMIN B6 PO) Take by mouth daily.    Marland Kitchen topiramate (TOPAMAX) 25 MG tablet Take 25 mg by mouth daily.    . traMADol (ULTRAM) 50 MG tablet Take 50 mg by mouth every 6 (six) hours as needed.     . baclofen (LIORESAL) 10 MG tablet Take 5 mg by mouth 2 (two) times daily.     . Cinnamon 500 MG capsule Take 500 mg by mouth daily.    Marland Kitchen letrozole (FEMARA) 2.5 MG tablet Take 1 tablet (2.5 mg total) by mouth daily. 90 tablet Share in overly4  . nystatin (MYCOSTATIN) powder Apply topically daily. Start using after skin has healed (Patient not taking: Reported on 10/13/2016) 15 g 2     No current facility-administered medications for this visit.     OBJECTIVE: Older white woman who appears well  Vitals:   10/13/16 1600  BP: (!) 142/76  Pulse: (!) 103  Resp: 17  Temp: 98.1 F (36.7 C)     Body mass index is 41.08 kg/m.    ECOG FS:0 - Asymptomatic  Ocular: Sclerae unicteric, pupils round and equal  Ear-nose-throat: Oropharynx clear and moist Lymphatic: No cervical or supraclavicular adenopathy Lungs no rales or rhonchi Heart regular rate and rhythm, no murmur appreciated Abd soft,obese,  nontender, positive bowel sounds MSK no focal spinal tenderness, no joint edema Neuro: non-focal,  poor recent memory, good social skills, pleasant affect Breasts: Right breast is unremarkable. The left breast is status post prior lumpectomy and radiation there is a palpable mass in the upper outer area, likely a seroma as the patient and her husband tells me this was not present prior to the recent biopsy. There is a large ecchymosis associated with this. There are no other skin or nipple changes of concern. The left axilla is benign.   LAB RESULTS:  CMP     Component Value Date/Time   NA 139 07/03/2012 0617   K 3.9 07/03/2012 0617   CL 101 07/03/2012 0617   CO2 29 07/03/2012 0617   GLUCOSE 91 07/03/2012 0617   BUN 28 (H) 07/03/2012 0617   CREATININE 1.72 (H) 07/03/2012 0617   CALCIUM 10.1 07/03/2012 0617   PROT 6.9 07/03/2012 0617   ALBUMIN 3.9 07/03/2012 0617   AST 18 07/03/2012 0617   ALT 9 07/03/2012 0617   ALKPHOS 58 07/03/2012 0617   BILITOT 0.4 07/03/2012 0617   GFRNONAA 28 (L) 07/03/2012 0617   GFRAA 32 (L) 07/03/2012 0617    No results found for: TOTALPROTELP, ALBUMINELP, A1GS, A2GS, BETS, BETA2SER, GAMS, MSPIKE, SPEI  No results found for: Nils Pyle, [facility]  Lab Results  Component Value Date   WBC 6.4 07/03/2012   NEUTROABS 3.9 05/05/2012   HGB 13.3 07/03/2012   HCT 41.1 07/03/2012   MCV 92.6 07/03/2012   PLT 216 07/03/2012       Chemistry      Component Value Date/Time   NA 139 07/03/2012 0617   K 3.9 07/03/2012 0617   CL 101 07/03/2012 0617   CO2 29 07/03/2012 0617   BUN 28 (H) 07/03/2012 0617   CREATININE 1.72 (H) 07/03/2012 0617      Component Value Date/Time   CALCIUM 10.1 07/03/2012 0617   ALKPHOS 58 07/03/2012 0617   AST  18 07/03/2012 0617   ALT 9 07/03/2012 0617   BILITOT 0.4 07/03/2012 0617       Lab Results  Component Value Date   LABCA2 12 05/08/2008    No components found for: [id number]  No results for input(s): INR in the last 168 hours.  Urinalysis    Component Value Date/Time   COLORURINE YELLOW 05/05/2012 [place] 05/05/2012 1658   LABSPEC 1.011 05/05/2012 1658   PHURINE 6.0 05/05/2012 1658   GLUCOSEU NEGATIVE 05/05/2012 1658   HGBUR NEGATIVE 05/05/2012 1658   BILIRUBINUR n 04/09/2015 1109   KETONESUR NEGATIVE 05/05/2012 1658   PROTEINUR n 04/09/2015 1109   PROTEINUR NEGATIVE 05/05/2012 1658   UROBILINOGEN negative 04/09/2015 1109   UROBILINOGEN 0.2 05/05/2012 1658   NITRITE trace 04/09/2015 1109   NITRITE NEGATIVE 05/05/2012 1658   LEUKOCYTESUR moderate (2+) (A) 04/09/2015 1109     STUDIES: US Breast Ltd Uni Left Inc Axilla  Result Date: 09/21/2016 CLINICAL DATA:  History of left breast cancer status post lumpectomy in 2006. History of a benign left breast stereotactic biopsy in 2007. EXAM: 2D DIGITAL DIAGNOSTIC LEFT MAMMOGRAM WITH ADJUNCT TOMO ULTRASOUND LEFT BREAST COMPARISON:  Previous exam(s). ACR Breast Density Category c: The breast tissue is heterogeneously dense, which may obscure small masses. FINDINGS: Additional imaging of the left breast was performed. There is 1.1 cm asymmetry with distortion in the 12 o'clock region of the left breast. Punctate developing calcifications are associated with this area and extend towards the nipple spanning an area of 6.9 cm. On physical exam, I do not palpate a mass in the 12 o'clock region of the left  breast. Targeted ultrasound is performed, showing no sonographic correlate with the area of distortion and asymmetry in the 12 o'clock region of the left breast. Sonographic evaluation the left axilla does not show any enlarged adenopathy. IMPRESSION: Suspicious asymmetry in distortion the 12 o'clock region of the left breast as well as suspicious calcifications. RECOMMENDATION: Stereotactic biopsy of the left breast asymmetry and distortion is recommended. Stereotactic biopsy of the anterior extent of the calcifications is recommended. The biopsies will be scheduled the patient's convenience. I have discussed the findings and recommendations with the patient. Results were also provided in writing at the conclusion of the visit. If applicable, a reminder letter will be sent to the patient regarding the next appointment. BI-RADS CATEGORY  4: Suspicious. Electronically Signed   By: Lillia Mountain M.D.   On: 09/21/2016 13:14   Mm Diag Breast Tomo Uni Left  Result Date: 09/21/2016 CLINICAL DATA:  History of left breast cancer status post lumpectomy in 2006. History of a benign left breast stereotactic biopsy in 2007. EXAM: 2D DIGITAL DIAGNOSTIC LEFT MAMMOGRAM WITH ADJUNCT TOMO ULTRASOUND LEFT BREAST COMPARISON:  Previous exam(s). ACR Breast Density Category c: The breast tissue is heterogeneously dense, which may obscure small masses. FINDINGS: Additional imaging of the left breast was performed. There is 1.1 cm asymmetry with distortion in the 12 o'clock region of the left breast. Punctate developing calcifications are associated with this area and extend towards the nipple spanning an area of 6.9 cm. On physical exam, I do not palpate a mass in the 12 o'clock region of the left breast. Targeted ultrasound is performed, showing no sonographic correlate with the area of distortion and asymmetry in the 12 o'clock region of the left breast. Sonographic evaluation the left axilla does not show any enlarged adenopathy.  IMPRESSION: Suspicious asymmetry in distortion the 12 o'clock region of  the left breast as well as suspicious calcifications. RECOMMENDATION: Stereotactic biopsy of the left breast asymmetry and distortion is recommended. Stereotactic biopsy of the anterior extent of the calcifications is recommended. The biopsies will be scheduled the patient's convenience. I have discussed the findings and recommendations with the patient. Results were also provided in writing at the conclusion of the visit. If applicable, a reminder letter will be sent to the patient regarding the next appointment. BI-RADS CATEGORY  4: Suspicious. Electronically Signed   By: Lillia Mountain M.D.   On: 09/21/2016 13:14   Mm Clip Placement Left  Result Date: 09/23/2016 CLINICAL DATA:  Patient status post stereotactic guided biopsy left breast distortion and left breast calcifications. EXAM: DIAGNOSTIC LEFT MAMMOGRAM POST STEREOTACTIC BIOPSY COMPARISON:  Previous exam(s). FINDINGS: Mammographic images were obtained following stereotactic guided biopsy of left breast distortion and calcifications. Site 1: Distortion: Cylinder-shaped marking clip in appropriate position. Site 2: Calcifications: X shaped marking clip in appropriate position. IMPRESSION: Appropriate position biopsy marking clip status post stereotactic guided biopsy left breast distortion and calcifications. Final Assessment: Post Procedure Mammograms for Marker Placement Electronically Signed   By: Lovey Newcomer M.D.   On: 09/23/2016 13:27   Mm Lt Breast Bx W Loc Dev 1st Lesion Image Bx Spec Stereo Guide  Result Date: 09/23/2016 CLINICAL DATA:  Patient with left breast architectural distortion. EXAM: LEFT BREAST STEREOTACTIC CORE NEEDLE BIOPSY COMPARISON:  Previous exams. FINDINGS: The patient and I discussed the procedure of stereotactic-guided biopsy including benefits and alternatives. We discussed the high likelihood of a successful procedure. We discussed the risks of the  procedure including infection, bleeding, tissue injury, clip migration, and inadequate sampling. Informed written consent was given. The usual time out protocol was performed immediately prior to the procedure. Using sterile technique and 1% Lidocaine as local anesthetic, under stereotactic guidance, a 9 gauge vacuum assisted core needle biopsy device was used to perform core needle biopsy of distortion within the central left breast 12 o'clock position using a cranial approach. At the conclusion of the procedure, a coil shaped tissue marker clip was deployed into the biopsy cavity. Follow-up 2-view mammogram was performed and dictated separately. IMPRESSION: Stereotactic-guided biopsy of left breast distortion. No apparent complications. Electronically Signed   By: Lovey Newcomer M.D.   On: 09/23/2016 13:24   Mm Lt Breast Bx W Loc Dev Ea Ad Lesion Img Bx Spec Stereo Guide  Addendum Date: 10/03/2016   ADDENDUM REPORT: 09/27/2016 10:07 ADDENDUM: Pathology revealed GRADE I INVASIVE DUCTAL CARCINOMA, DUCTAL CARCINOMA IN SITU WITH CALCIFICATIONS of the Left breast, 12 o'clock. LOW TO INTERMEDIATE GRADE DUCTAL CARCINOMA IN SITU WITH FOCI OF COMEDO NECROSIS AND CALCIFICATIONS of the Left breast, anterior superior. This was found to be concordant by Dr. Lovey Newcomer. Pathology results were discussed with the patient by telephone. The patient reported doing well after the biopsies with tenderness at the sites. Post biopsy instructions and care were reviewed and questions were answered. The patient was encouraged to call [facility] for any additional concerns. Surgical consultation has been arranged with Dr. Erroll Luna at [facility] on September 30, 2016. Pathology results reported by Terie Purser, RN on 09/27/2016. Electronically Signed   By: Lovey Newcomer M.D.   On: 09/27/2016 10:07   Result Date: 10/03/2016 CLINICAL DATA:  Indeterminate left breast calcifications. EXAM: LEFT BREAST  STEREOTACTIC CORE NEEDLE BIOPSY COMPARISON:  Previous exams. FINDINGS: The patient and I discussed the procedure of stereotactic-guided biopsy including benefits and  alternatives. We discussed the high likelihood of a successful procedure. We discussed the risks of the procedure including infection, bleeding, tissue injury, clip migration, and inadequate sampling. Informed written consent was given. The usual time out protocol was performed immediately prior to the procedure. Using sterile technique and 1% Lidocaine as local anesthetic, under stereotactic guidance, a 9 gauge vacuum assisted core needle biopsy device was used to perform core needle biopsy of calcifications within the anterior superior left breast using a cranial approach. Specimen radiograph was performed showing calcifications. Specimens with calcifications are identified for pathology. At the conclusion of the procedure, a X shaped tissue marker clip was deployed into the biopsy cavity. Follow-up 2-view mammogram was performed and dictated separately. IMPRESSION: Stereotactic-guided biopsy of left breast calcifications. No apparent complications. Electronically Signed: By: Lovey Newcomer M.D. On: 09/23/2016 13:25    ELIGIBLE FOR AVAILABLE RESEARCH PROTOCOL: no  ASSESSMENT: 83 y.o. [place] woman  (1) status post left breast lower inner quadrant lumpectomy 2006, followed by radiation and brief course of anti-estrogens  (2) status post left breast overlapping sites biopsy 09/23/2016 for a clinical mT1b N0, grade 1 invasive ductal carcinoma, estrogen and progesterone receptor positive, HER-2 not amplified, with an MIB-15%  (3) definitive surgery pending  (4) letrozole started 10/13/2016  PLAN: We spent the better part of today's hour-long appointment discussing the treatmentf breast cancer in general, and the specifics of the patient's tumor in particular. The family in particular is interested in "alternatives" to mastectomy. This  could include lumpectomy or no surgery and all. We reviewed the fact that lumpectomy alone, without radiation, is not equivalent to mastectomy and that the risk of local recurrence is higher. In addition it is not clear, given the extent of disease in the patient's left breast (nearly 7 cm) that a lumpectomy would be any easier on the patient surgically.  The family's concern is that the patient's dementia may be made significantly worse by anesthesia.  We establish the fact that aside from the dementia issue and of course her age,Lib is "strong as a horse" as her husband put it. In short she is not expected to die in the next 6-24 months. This is why short term measures are not a good idea. It may mean that if she has local progression of her tumor with ulceration pain and so on in the breast she may need surgery at a time when she is physically and mentally even less capable of undergoing it  We discussed the possibility of starting anti-estrogens now. We discussed letrozole in particular and they have a good grasp of the possible toxicities, side effects and complications of this agent. We discussed the fact that in the past the patient was not able to tolerate anti-estrogens well and therefore it is a question whether this strategy (anti-estrogens alone, no surgery) might be viable. Nevertheless I think it is prudent to start letrozole now. It will give [place] information on the patient's tolerance. We will give [place] time to get the patient's families questions answered.  Given all this discussion my recommendation is that the patient start letrozole now but proceed to left mastectomy without sentinel lymph node sampling at the earliest agreeable time  Before proceeding to surgery the patient would like 3 questions addressed: First, candy mastectomy be done not under general anesthesia -- we will pose that question to Dr. Brantley Stage. Second if the patient does need general anesthesia for a brief period for left  mastectomy is as expected to have a  significant effect on her dementia. The family already has an appointment scheduled with Dr. Jannifer Franklin and they will address that question at that point. The third issue as already indicated is how the patient may tolerate letrozole. We will have the answer to that question by the time the patient returns to see me in approximately 6 weeks.  Accordingly the plan at this point is to start letrozole, strongly consider left mastectomy without sentinel lymph node sampling and then readdress the issue of letrozole: If the surgical results are optimal the patient may undergo observation instead of anti-estrogens which very likely she would prefer  Lib and her family have  a good understanding of the overall plan.They agree  with it.They know  the goal of treatment inthis  case is cure.They  will call with any problems that may develop before her next visit here, which will be in approximately 6 weeks.  Chauncey Cruel, MD   10/14/2016 7:07 AM Medical Oncology and Hematology [facility] [address] [place], [place] [address] Tel. [phone]    Fax. [phone]

## 2016-10-14 ENCOUNTER — Encounter: Payer: Self-pay | Admitting: Oncology

## 2016-10-14 ENCOUNTER — Other Ambulatory Visit: Payer: Self-pay | Admitting: *Deleted

## 2016-10-14 ENCOUNTER — Telehealth: Payer: Self-pay | Admitting: Oncology

## 2016-10-14 NOTE — Telephone Encounter (Signed)
sw pt to confirm 5/14 appt at 2 pm per LOS

## 2016-10-31 ENCOUNTER — Ambulatory Visit: Payer: Medicare Other | Admitting: Adult Health

## 2016-10-31 ENCOUNTER — Telehealth: Payer: Self-pay | Admitting: *Deleted

## 2016-10-31 NOTE — Telephone Encounter (Signed)
Spoke with husband and advised need to reschedule FU today due to power outage at office. He stated he did want a note made that patient has small breast tumor which may require surgery. He stated Dr Jana Hakim has patient on medication with intention of shrinking tumor, but she may eventually require surgery. If so, he would want to discuss safety of her being put to sleep. Follow up rescheduled with his agreement to date, time. He will inform office if a surgery date is set prior to her FU.

## 2016-11-03 ENCOUNTER — Other Ambulatory Visit: Payer: Self-pay | Admitting: Oncology

## 2016-11-03 DIAGNOSIS — C50312 Malignant neoplasm of lower-inner quadrant of left female breast: Secondary | ICD-10-CM

## 2016-11-03 DIAGNOSIS — C50919 Malignant neoplasm of unspecified site of unspecified female breast: Secondary | ICD-10-CM

## 2016-11-03 DIAGNOSIS — Z170 Estrogen receptor positive status [ER+]: Principal | ICD-10-CM

## 2016-11-04 ENCOUNTER — Other Ambulatory Visit: Payer: Self-pay

## 2016-11-04 ENCOUNTER — Telehealth: Payer: Self-pay | Admitting: Oncology

## 2016-11-04 DIAGNOSIS — Z170 Estrogen receptor positive status [ER+]: Principal | ICD-10-CM

## 2016-11-04 DIAGNOSIS — C50312 Malignant neoplasm of lower-inner quadrant of left female breast: Secondary | ICD-10-CM

## 2016-11-04 DIAGNOSIS — C50812 Malignant neoplasm of overlapping sites of left female breast: Secondary | ICD-10-CM

## 2016-11-04 NOTE — Telephone Encounter (Signed)
lvm to inform pt May appts per LOS

## 2016-11-24 ENCOUNTER — Other Ambulatory Visit: Payer: Self-pay | Admitting: Oncology

## 2016-11-24 ENCOUNTER — Ambulatory Visit
Admission: RE | Admit: 2016-11-24 | Discharge: 2016-11-24 | Disposition: A | Discharge status: Home / Self Care | Payer: Medicare Other | Source: Ambulatory Visit | Attending: Oncology | Admitting: Oncology

## 2016-11-24 ENCOUNTER — Telehealth: Payer: Self-pay

## 2016-11-24 ENCOUNTER — Inpatient Hospital Stay: Admission: RE | Admit: 2016-11-24 | Payer: Medicare Other | Source: Ambulatory Visit

## 2016-11-24 DIAGNOSIS — C50919 Malignant neoplasm of unspecified site of unspecified female breast: Secondary | ICD-10-CM

## 2016-11-24 HISTORY — DX: Malignant neoplasm of unspecified site of unspecified female breast: C50.919

## 2016-11-24 HISTORY — DX: Personal history of irradiation: Z92.3

## 2016-11-24 NOTE — Telephone Encounter (Signed)
Spoke with Moshe Salisbury at [facility] and clarified reason for [place]

## 2016-11-24 NOTE — Progress Notes (Unsigned)
[facility]  [phone] [phone]     ID: Kiara Nelson DOB: Nov 28, 1933  MR#: [id number]  PNT#:[id number]  Patient Care Team: Jani Gravel, MD as PCP - General (Internal Medicine) Erroll Luna, MD as Consulting Physician (General Surgery) Vedanshi Massaro, Virgie Dad, MD as Consulting Physician (Oncology) Kathrynn Ducking, MD as Consulting Physician (Neurology) Chauncey Cruel, MD OTHER MD:  CHIEF COMPLAINT: Recurrent breast cancer in previously irradiated breast  CURRENT TREATMENT: Neoadjuvant anastrozole   BREAST CANCER HISTORY: "Kiara Nelson" has a history of remote breast cancer, status post left lumpectomy in 2006 treated with adjuvant radiation then anti-estrogens briefly, discontinued by the patient because of sciatica concerns. I was her medical oncologist at that time. Marylene Buerger was her surgeon  More recently, on 09/13/2016 she had bilateral screening mammography with tomography at the [facility]. There were postoperative changes in the lower inner quadrant of the left breast. However in the upper central left breast was a possible mass and the patient was brought back on 09/21/2016 for unilateral left diagnostic mammography with tomography and left breast ultrasonography. The breast density was category C. At the 12:00 region of the left breast there was a 1.1 cm area of asymmetry associated with punctate calcifications. This was not palpable by exam. Ultrasound found no correlate. The left axilla was sonographically benign.  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 The invasive tumor was estrogen receptor positive at 100%, progesterone receptor positive at 20%, both with strong staining intensity, with an MIB-1 of 5%, and no HER-2  amplification, the signals ratio being 1.13 and the number per cell 1.75 area  The patient's subsequent history is as detailed below.  INTERVAL HISTORY: Kiara Nelson was evaluated in the breast clinic 10/13/2016 accompanied by her husband  and oldest daughter. Her case was also presented in the multidisciplinary breast cancer conference 09/07/2016. At that time a preliminary plan was proposed: It was noted that the patient had a 6.9 cm area of calcification consistent with ductal carcinoma in situ. An aromatase inhibitor was recommended.  REVIEW OF SYSTEMS: There were no specific symptoms leading to the original mammogram, which was routinely scheduled. The patient denies unusual headaches, visual changes, nausea, vomiting, stiff neck, dizziness, or gait imbalance. There has been no cough, phlegm production, or pleurisy, no chest pain or pressure, and no change in bowel or bladder habits. The patient denies fever, rash, bleeding, unexplained fatigue or unexplained weight loss. The patient's main problem is dementia, which is moderate to severe but does not yet impact her social interactions and activities of daily living. She can "do for herself" in most things although at this point her husband does the cooking, most of the housework, all the driving, and generally "I do what he says". A detailed review of systems was otherwise entirely negative.   PAST MEDICAL HISTORY: Past Medical History:  Diagnosis Date  . Anxiety   . Arthritis   . Back pain, chronic    lumbar up to thoracic area  . Cancer [facility])    Breast cancer  . Chronic low back pain 04/30/2014  . Complex endometrial hyperplasia 2004   renal insufficiency  . Dementia   . Depression 04/30/2014  . Diabetes mellitus without complication ([place])   . Headache(784.0) 10/29/2013  . Hypercholesteremia   . Hypertension   .  Obese   . OSA on CPAP   . Recurrent cystitis     PAST SURGICAL HISTORY: Past Surgical History:  Procedure Laterality Date    . BREAST SURGERY    . CATARACT EXTRACTION, BILATERAL  09/2012  . CYSTOSCOPY  07/03/2012   Procedure: CYSTOSCOPY;  Surgeon: Reece Packer, MD;  Location: [facility];  Service: Urology;;  . [place] OF UTERUS  2004   polyp resection  . EXAMINATION UNDER ANESTHESIA  07/03/2012   Procedure: EXAM UNDER ANESTHESIA;  Surgeon: Reece Packer, MD;  Location: [facility];  Service: Urology;  Laterality: N/A;  . FOOT NEUROMA SURGERY    . TONSILLECTOMY  1962  . TUBAL LIGATION  1970  . VAGINAL HYSTERECTOMY  07/03/2012   Procedure: HYSTERECTOMY VAGINAL;  Surgeon: Peri Maris, MD;  Location: [facility];  Service: Gynecology;  Laterality: N/A;    FAMILY HISTORY Family History  Problem Relation Age of Onset  . Hypertension Mother   . Heart disease Mother   . Hypertension Father   . Dementia Father   . Depression Sister   The patient's parents died from causes unrelated to cancer. The patient has one sister, no brothers. A maternal grandmother and a maternal aunt were diagnosed with breast cancers in their 22s. There is no other history of breast cancer in the family and no ovarian cancer history to the family's knowledge   GYNECOLOGIC HISTORY:  Patient's last menstrual period was 07/18/1969 (approximate).  menarche age 6, first live birth age 27. The patient is GX P3. She never took hormone replacement. 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 Their oldest daughter lives in [place] and works as a Herbalist. Son Harrie Jeans lives in [place] and works for [employer]. Daughter Levander Campion lives in [place] and works in Engineer, technical sales. The patient has 4 grandsons and one daughter, granddaughter graduating from [employer] in nursing school this year. The patient attend a local [place]: Not in  place   HEALTH MAINTENANCE: Social History  Substance Use Topics  . Smoking status: Never Smoker  . Smokeless tobacco: Never Used  . Alcohol use 0.0 oz/week     Comment: rarely     Colonoscopy:  PAP:  Bone density:   Allergies  Allergen Reactions  . Bee Venom Swelling  . Penicillins Swelling    Swelling of hands  . Sulfa Antibiotics     Current Outpatient Prescriptions  Medication Sig Dispense Refill  . baclofen (LIORESAL) 10 MG tablet Take 5 mg by mouth 2 (two) times daily.     . Cholecalciferol (VITAMIN D3) 1000 UNITS CAPS Take by mouth daily.    . Cinnamon 500 MG capsule Take 500 mg by mouth daily.    Marland Kitchen desvenlafaxine (PRISTIQ) 100 MG 24 hr tablet Take 1 tablet (100 mg total) by mouth daily. 90 tablet 3  . donepezil (ARICEPT) 10 MG tablet TAKE ONE TABLET BY MOUTH AT BEDTIME 90 tablet 3  . fenofibrate (TRICOR) 48 MG tablet Take 48 mg by mouth daily.    Marland Kitchen HYDROCODONE-ACETAMINOPHEN PO Take 1 tablet by mouth as needed.    Marland Kitchen letrozole (FEMARA) 2.5 MG tablet Take 1 tablet (2.5 mg total) by mouth daily. 90 tablet Share in [place]  . levothyroxine (SYNTHROID, LEVOTHROID) 100 MCG tablet Take 100 mcg by mouth daily before breakfast.    .  levothyroxine (SYNTHROID, LEVOTHROID) 112 MCG tablet Take 112 mcg by mouth daily before breakfast.     . LORazepam (ATIVAN) 1 MG tablet Take 0.5 tablets by mouth at bedtime as needed and may repeat dose one time if needed.    Marland Kitchen losartan (COZAAR) 50 MG tablet Take 50 mg by mouth daily.    . Multiple Vitamins-Minerals (MULTIVITAMIN WITH MINERALS) tablet Take 1 tablet by mouth daily.    Marland Kitchen nystatin (MYCOSTATIN) powder Apply topically daily. Start using after skin has healed (Patient not taking: Reported on 10/13/2016) 15 g 2  . Omega-3 Fatty Acids (FISH OIL) 1000 MG CAPS Take 1 capsule by mouth 2 (two) times daily.    . Pyridoxine HCl (VITAMIN B6 PO) Take by mouth daily.    Marland Kitchen topiramate (TOPAMAX) 25 MG tablet Take 25 mg by mouth daily.    . traMADol  (ULTRAM) 50 MG tablet Take 50 mg by mouth every 6 (six) hours as needed.      No current facility-administered medications for this visit.     OBJECTIVE: Older white woman who appears well  There were no vitals filed for this visit.   There is no height or weight on file to calculate BMI.    ECOG FS:0 - Asymptomatic  Ocular: Sclerae unicteric, pupils round and equal  Ear-nose-throat: Oropharynx clear and moist Lymphatic: No cervical or supraclavicular adenopathy Lungs no rales or rhonchi Heart regular rate and rhythm, no murmur appreciated Abd soft,obese,  nontender, positive bowel sounds MSK no focal spinal tenderness, no joint edema Neuro: non-focal,  poor recent memory, good social skills, pleasant affect Breasts: Right breast is unremarkable. The left breast is status post prior lumpectomy and radiation there is a palpable mass in the upper outer area, likely a seroma as the patient and her husband tells me this was not present prior to the recent biopsy. There is a large ecchymosis associated with this. There are no other skin or nipple changes of concern. The left axilla is benign.   LAB RESULTS:  CMP     Component Value Date/Time   NA 139 07/03/2012 0617   K 3.9 07/03/2012 0617   CL 101 07/03/2012 0617   CO2 29 07/03/2012 0617   GLUCOSE 91 07/03/2012 0617   BUN 28 (H) 07/03/2012 0617   CREATININE 1.72 (H) 07/03/2012 0617   CALCIUM 10.1 07/03/2012 0617   PROT 6.9 07/03/2012 0617   ALBUMIN 3.9 07/03/2012 0617   AST 18 07/03/2012 0617   ALT 9 07/03/2012 0617   ALKPHOS 58 07/03/2012 0617   BILITOT 0.4 07/03/2012 0617   GFRNONAA 28 (L) 07/03/2012 0617   GFRAA 32 (L) 07/03/2012 0617    No results found for: TOTALPROTELP, ALBUMINELP, A1GS, A2GS, BETS, BETA2SER, GAMS, MSPIKE, SPEI  No results found for: Nils Pyle, [facility]  Lab Results  Component Value Date   WBC 6.4 07/03/2012   NEUTROABS 3.9 05/05/2012   HGB 13.3 07/03/2012   HCT 41.1 07/03/2012    MCV 92.6 07/03/2012   PLT 216 07/03/2012      Chemistry      Component Value Date/Time   NA 139 07/03/2012 0617   K 3.9 07/03/2012 0617   CL 101 07/03/2012 0617   CO2 29 07/03/2012 0617   BUN 28 (H) 07/03/2012 0617   CREATININE 1.72 (H) 07/03/2012 0617      Component Value Date/Time   CALCIUM 10.1 07/03/2012 0617   ALKPHOS 58 07/03/2012 0617   AST 18 07/03/2012 0617   ALT  9 07/03/2012 0617   BILITOT 0.4 07/03/2012 0617       Lab Results  Component Value Date   LABCA2 12 05/08/2008    No components found for: [id number]  No results for input(s): INR in the last 168 hours.  Urinalysis    Component Value Date/Time   COLORURINE YELLOW 05/05/2012 [place] 05/05/2012 1658   LABSPEC 1.011 05/05/2012 1658   PHURINE 6.0 05/05/2012 1658   GLUCOSEU NEGATIVE 05/05/2012 1658   HGBUR NEGATIVE 05/05/2012 1658   BILIRUBINUR n 04/09/2015 1109   KETONESUR NEGATIVE 05/05/2012 1658   PROTEINUR n 04/09/2015 1109   PROTEINUR NEGATIVE 05/05/2012 1658   UROBILINOGEN negative 04/09/2015 1109   UROBILINOGEN 0.2 05/05/2012 1658   NITRITE trace 04/09/2015 1109   NITRITE NEGATIVE 05/05/2012 1658   LEUKOCYTESUR moderate (2+) (A) 04/09/2015 1109     STUDIES: No results found.  ELIGIBLE FOR AVAILABLE RESEARCH PROTOCOL: no  ASSESSMENT: 83 y.o. [place] woman  (1) status post left breast lower inner quadrant lumpectomy 2006, followed by radiation and brief course of anti-estrogens  (2) status post left breast overlapping sites biopsy 09/23/2016 for a clinical mT1b N0, grade 1 invasive ductal carcinoma, estrogen and progesterone receptor positive, HER-2 not amplified, with an MIB-15%  (3) definitive surgery pending  (4) letrozole started 10/13/2016  PLAN: We spent the better part of today's hour-long appointment discussing the treatmentf breast cancer in general, and the specifics of the patient's tumor in particular. The family in particular is interested in  "alternatives" to mastectomy. This could include lumpectomy or no surgery and all. We reviewed the fact that lumpectomy alone, without radiation, is not equivalent to mastectomy and that the risk of local recurrence is higher. In addition it is not clear, given the extent of disease in the patient's left breast (nearly 7 cm) that a lumpectomy would be any easier on the patient surgically.  The family's concern is that the patient's dementia may be made significantly worse by anesthesia.  We establish the fact that aside from the dementia issue and of course her age,Kiara Nelson is "strong as a horse" as her husband put it. In short she is not expected to die in the next 6-24 months. This is why short term measures are not a good idea. It may mean that if she has local progression of her tumor with ulceration pain and so on in the breast she may need surgery at a time when she is physically and mentally even less capable of undergoing it  We discussed the possibility of starting anti-estrogens now. We discussed letrozole in particular and they have a good grasp of the possible toxicities, side effects and complications of this agent. We discussed the fact that in the past the patient was not able to tolerate anti-estrogens well and therefore it is a question whether this strategy (anti-estrogens alone, no surgery) might be viable. Nevertheless I think it is prudent to start letrozole now. It will give [place] information on the patient's tolerance. We will give [place] time to get the patient's families questions answered.  Given all this discussion my recommendation is that the patient start letrozole now but proceed to left mastectomy without sentinel lymph node sampling at the earliest agreeable time  Before proceeding to surgery the patient would like 3 questions addressed: First, candy mastectomy be done not under general anesthesia -- we will pose that question to Dr. Brantley Stage. Second if the patient does need general  anesthesia for a brief period for  left mastectomy is as expected to have a significant effect on her dementia. The family already has an appointment scheduled with Dr. Jannifer Franklin and they will address that question at that point. The third issue as already indicated is how the patient may tolerate letrozole. We will have the answer to that question by the time the patient returns to see me in approximately 6 weeks.  Accordingly the plan at this point is to start letrozole, strongly consider left mastectomy without sentinel lymph node sampling and then readdress the issue of letrozole: If the surgical results are optimal the patient may undergo observation instead of anti-estrogens which very likely she would prefer  Kiara Nelson and her family have  a good understanding of the overall plan.They agree  with it.They know  the goal of treatment inthis  case is cure.They  will call with any problems that may develop before her next visit here, which will be in approximately 6 weeks.  Chauncey Cruel, MD   11/24/2016 9:43 AM Medical Oncology and Hematology [facility] [address] [place], [place] [address] Tel. [phone]    Fax. [phone]

## 2016-11-24 NOTE — Telephone Encounter (Signed)
Cara from the breast center called to find out why they are doing US guided aspiration. They did a bx on March. Is there a new area or a new lump? Do they need to do a MM or [place] first to work her up? Pt is coming today and she would like an answer by 11 if possible to help be prepared.

## 2016-11-28 ENCOUNTER — Other Ambulatory Visit: Payer: Self-pay | Admitting: Oncology

## 2016-11-28 ENCOUNTER — Ambulatory Visit (HOSPITAL_BASED_OUTPATIENT_CLINIC_OR_DEPARTMENT_OTHER): Payer: Medicare Other | Admitting: Oncology

## 2016-11-28 VITALS — BP 159/64 | HR 70 | Temp 98.2°F | Resp 17 | Ht 63.5 in | Wt 230.4 lb

## 2016-11-28 DIAGNOSIS — Z79811 Long term (current) use of aromatase inhibitors: Secondary | ICD-10-CM

## 2016-11-28 DIAGNOSIS — Z853 Personal history of malignant neoplasm of breast: Secondary | ICD-10-CM | POA: Diagnosis not present

## 2016-11-28 DIAGNOSIS — C50312 Malignant neoplasm of lower-inner quadrant of left female breast: Secondary | ICD-10-CM

## 2016-11-28 DIAGNOSIS — Z170 Estrogen receptor positive status [ER+]: Principal | ICD-10-CM

## 2016-11-28 DIAGNOSIS — C50812 Malignant neoplasm of overlapping sites of left female breast: Secondary | ICD-10-CM

## 2016-11-28 NOTE — Progress Notes (Signed)
[place]  [phone] [phone]     ID: [name] DOB: [birth year]  MR#: [id number]  DJS#:[id number]  Patient Care Team: Jani Gravel, MD as PCP - General (Internal Medicine) Erroll Luna, MD as Consulting Physician (General Surgery) Magrinat, Virgie Dad, MD as Consulting Physician (Oncology) Kathrynn Ducking, MD as Consulting Physician (Neurology) Chauncey Cruel, MD OTHER MD:  CHIEF COMPLAINT: Recurrent breast cancer in previously irradiated breast  CURRENT TREATMENT: Neoadjuvant anastrozole   BREAST CANCER HISTORY: "Kiara Nelson" has a history of remote breast cancer, status post left lumpectomy in 2006 treated with adjuvant radiation then anti-estrogens briefly, discontinued by the patient because of sciatica concerns. I was her medical oncologist at that time. Marylene Buerger was her surgeon  More recently, on 09/13/2016 she had bilateral screening mammography with tomography at the [facility]. There were postoperative changes in the lower inner quadrant of the left breast. However in the upper central left breast was a possible mass and the patient was brought back on 09/21/2016 for unilateral left diagnostic mammography with tomography and left breast ultrasonography. The breast density was category C. At the 12:00 region of the left breast there was a 1.1 cm area of asymmetry associated with punctate calcifications. This was not palpable by exam. Ultrasound found no correlate. The left axilla was sonographically benign.  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 The invasive tumor was estrogen receptor positive at 100%, progesterone receptor positive at 20%, both with strong staining intensity, with an MIB-1 of 5%, and no HER-2  amplification, the signals ratio being 1.13 and the number per cell 1.75 area  The patient's subsequent history is as detailed below.  INTERVAL HISTORY: Kiara Nelson was evaluated in the breast clinic 10/13/2016 accompanied by her husband  and oldest daughter. Her case was also presented in the multidisciplinary breast cancer conference 09/07/2016. At that time a preliminary plan was proposed: It was noted that the patient had a 6.9 cm area of calcification consistent with ductal carcinoma in situ. An aromatase inhibitor was recommended.  REVIEW OF SYSTEMS: There were no specific symptoms leading to the original mammogram, which was routinely scheduled. The patient denies unusual headaches, visual changes, nausea, vomiting, stiff neck, dizziness, or gait imbalance. There has been no cough, phlegm production, or pleurisy, no chest pain or pressure, and no change in bowel or bladder habits. The patient denies fever, rash, bleeding, unexplained fatigue or unexplained weight loss. The patient's main problem is dementia, which is moderate to severe but does not yet impact her social interactions and activities of daily living. She can "do for herself" in most things although at this point her husband does the cooking, most of the housework, all the driving, and generally "I do what he says". A detailed review of systems was otherwise entirely negative.   PAST MEDICAL HISTORY: Past Medical History:  Diagnosis Date  . Anxiety   . Arthritis   . Back pain, chronic    lumbar up to thoracic area  . Breast cancer ([place])    left breast 2006  . Cancer [facility])    Breast cancer  . Chronic low back pain 04/30/2014  . Complex endometrial hyperplasia 2004   renal insufficiency  . Dementia   . Depression 04/30/2014  . Diabetes mellitus without complication ([place])   .  Headache(784.0) 10/29/2013  . Hypercholesteremia   . Hypertension   . Obese   . OSA on CPAP   . Personal history of radiation therapy    left breast  2006  . Recurrent cystitis     PAST SURGICAL HISTORY: Past Surgical History:  Procedure Laterality Date  . BREAST BIOPSY Left    2018  . BREAST LUMPECTOMY Left    malignant 2006  . BREAST SURGERY    . CATARACT EXTRACTION, BILATERAL  09/2012  . CYSTOSCOPY  07/03/2012   Procedure: CYSTOSCOPY;  Surgeon: Reece Packer, MD;  Location: [place] [facility];  Service: Urology;;  . [place] OF UTERUS  2004   polyp resection  . EXAMINATION UNDER ANESTHESIA  07/03/2012   Procedure: EXAM UNDER ANESTHESIA;  Surgeon: Reece Packer, MD;  Location: [place] [facility];  Service: Urology;  Laterality: N/A;  . FOOT NEUROMA SURGERY    . TONSILLECTOMY  1962  . TUBAL LIGATION  1970  . VAGINAL HYSTERECTOMY  07/03/2012   Procedure: HYSTERECTOMY VAGINAL;  Surgeon: Peri Maris, MD;  Location: [place] [facility];  Service: Gynecology;  Laterality: N/A;    FAMILY HISTORY Family History  Problem Relation Age of Onset  . Hypertension Mother   . Heart disease Mother   . Hypertension Father   . Dementia Father   . Depression Sister   The patient's parents died from causes unrelated to cancer. The patient has one sister, no brothers. A maternal grandmother and a maternal aunt were diagnosed with breast cancers in their 24s. There is no other history of breast cancer in the family and no ovarian cancer history to the family's knowledge   GYNECOLOGIC HISTORY:  Patient's last menstrual period was 07/18/1969 (approximate).  menarche age 24, first live birth age 6. The patient is GX P3. She never took hormone replacement. 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 Their oldest daughter lives in [place] and works as a Herbalist. Son Harrie Jeans lives in [place] and works for [employer]. Daughter Levander Campion lives in [place] and works in Engineer, technical sales. The  patient has 4 grandsons and one daughter, granddaughter graduating from [employer] in nursing school this year. The patient attend a local [place]: Not in place   HEALTH MAINTENANCE: Social History  Substance Use Topics  . Smoking status: Never Smoker  . Smokeless tobacco: Never Used  . Alcohol use 0.0 oz/week     Comment: rarely     Colonoscopy:  PAP:  Bone density:   Allergies  Allergen Reactions  . Bee Venom Swelling  . Penicillins Swelling    Swelling of hands  . Sulfa Antibiotics     Current Outpatient Prescriptions  Medication Sig Dispense Refill  . baclofen (LIORESAL) 10 MG tablet Take 5 mg by mouth 2 (two) times daily.     . Cholecalciferol (VITAMIN D3) 1000 UNITS CAPS Take by mouth daily.    . Cinnamon 500 MG capsule Take 500 mg by mouth daily.    Marland Kitchen desvenlafaxine (PRISTIQ) 100 MG 24 hr tablet Take 1 tablet (100 mg total) by mouth daily. 90 tablet 3  . donepezil (ARICEPT) 10 MG tablet TAKE ONE TABLET BY MOUTH AT BEDTIME 90 tablet 3  . fenofibrate (TRICOR) 48 MG tablet Take 48 mg by mouth daily.    Marland Kitchen HYDROCODONE-ACETAMINOPHEN PO Take 1 tablet by mouth  as needed.    Marland Kitchen letrozole (FEMARA) 2.5 MG tablet Take 1 tablet (2.5 mg total) by mouth daily. 90 tablet [place] in [place]  . levothyroxine (SYNTHROID, LEVOTHROID) 100 MCG tablet Take 100 mcg by mouth daily before breakfast.    . levothyroxine (SYNTHROID, LEVOTHROID) 112 MCG tablet Take 112 mcg by mouth daily before breakfast.     . LORazepam (ATIVAN) 1 MG tablet Take 0.5 tablets by mouth at bedtime as needed and may repeat dose one time if needed.    Marland Kitchen losartan (COZAAR) 50 MG tablet Take 50 mg by mouth daily.    . Multiple Vitamins-Minerals (MULTIVITAMIN WITH MINERALS) tablet Take 1 tablet by mouth daily.    Marland Kitchen nystatin (MYCOSTATIN) powder Apply topically daily. Start using after skin has healed (Patient not taking: Reported on 10/13/2016) 15 g 2  . Omega-3 Fatty Acids (FISH OIL) 1000 MG CAPS Take 1  capsule by mouth 2 (two) times daily.    . Pyridoxine HCl (VITAMIN B6 PO) Take by mouth daily.    Marland Kitchen topiramate (TOPAMAX) 25 MG tablet Take 25 mg by mouth daily.    . traMADol (ULTRAM) 50 MG tablet Take 50 mg by mouth every 6 (six) hours as needed.      No current facility-administered medications for this visit.     OBJECTIVE: Older white woman who appears well  There were no vitals filed for this visit.   There is no height or weight on file to calculate BMI.    ECOG FS:0 - Asymptomatic  Ocular: Sclerae unicteric, pupils round and equal  Ear-nose-throat: Oropharynx clear and moist Lymphatic: No cervical or supraclavicular adenopathy Lungs no rales or rhonchi Heart regular rate and rhythm, no murmur appreciated Abd soft,obese,  nontender, positive bowel sounds MSK no focal spinal tenderness, no joint edema Neuro: non-focal,  poor recent memory, good social skills, pleasant affect Breasts: Right breast is unremarkable. The left breast is status post prior lumpectomy and radiation there is a palpable mass in the upper outer area, likely a seroma as the patient and her husband tells me this was not present prior to the recent biopsy. There is a large ecchymosis associated with this. There are no other skin or nipple changes of concern. The left axilla is benign.   LAB RESULTS:  CMP     Component Value Date/Time   NA 139 07/03/2012 0617   K 3.9 07/03/2012 0617   CL 101 07/03/2012 0617   CO2 29 07/03/2012 0617   GLUCOSE 91 07/03/2012 0617   BUN 28 (H) 07/03/2012 0617   CREATININE 1.72 (H) 07/03/2012 0617   CALCIUM 10.1 07/03/2012 0617   PROT 6.9 07/03/2012 0617   ALBUMIN 3.9 07/03/2012 0617   AST 18 07/03/2012 0617   ALT 9 07/03/2012 0617   ALKPHOS 58 07/03/2012 0617   BILITOT 0.4 07/03/2012 0617   GFRNONAA 28 (L) 07/03/2012 0617   GFRAA 32 (L) 07/03/2012 0617    No results found for: TOTALPROTELP, ALBUMINELP, A1GS, A2GS, BETS, BETA2SER, GAMS, MSPIKE, SPEI  No results found  for: KPAFRELGTCHN, LAMBDASER, KAPLAMBRATIO  Lab Results  Component Value Date   WBC 6.4 07/03/2012   NEUTROABS 3.9 05/05/2012   HGB 13.3 07/03/2012   HCT 41.1 07/03/2012   MCV 92.6 07/03/2012   PLT 216 07/03/2012      Chemistry      Component Value Date/Time   NA 139 07/03/2012 0617   K 3.9 07/03/2012 0617   CL 101 07/03/2012 0617   CO2 29  07/03/2012 0617   BUN 28 (H) 07/03/2012 0617   CREATININE 1.72 (H) 07/03/2012 0617      Component Value Date/Time   CALCIUM 10.1 07/03/2012 0617   ALKPHOS 58 07/03/2012 0617   AST 18 07/03/2012 0617   ALT 9 07/03/2012 0617   BILITOT 0.4 07/03/2012 0617       Lab Results  Component Value Date   LABCA2 12 05/08/2008    No components found for: [id number]  No results for input(s): INR in the last 168 hours.  Urinalysis    Component Value Date/Time   COLORURINE YELLOW 05/05/2012 [place] 05/05/2012 1658   LABSPEC 1.011 05/05/2012 1658   PHURINE 6.0 05/05/2012 1658   GLUCOSEU NEGATIVE 05/05/2012 1658   HGBUR NEGATIVE 05/05/2012 1658   BILIRUBINUR n 04/09/2015 1109   KETONESUR NEGATIVE 05/05/2012 1658   PROTEINUR n 04/09/2015 1109   PROTEINUR NEGATIVE 05/05/2012 1658   UROBILINOGEN negative 04/09/2015 1109   UROBILINOGEN 0.2 05/05/2012 1658   NITRITE trace 04/09/2015 1109   NITRITE NEGATIVE 05/05/2012 1658   LEUKOCYTESUR moderate (2+) (A) 04/09/2015 1109     STUDIES: US Breast Ltd Uni Left Inc Axilla  Result Date: 11/25/2016 CLINICAL DATA:  Followup left breast cancer treated with Letrazole. EXAM: 2D DIGITAL DIAGNOSTIC LEFT MAMMOGRAM WITH CAD AND ADJUNCT TOMO ULTRASOUND LEFT BREAST COMPARISON:  Previous exam(s). ACR Breast Density Category c: The breast tissue is heterogeneously dense, which may obscure small masses. FINDINGS: The previously biopsied 1.1 cm asymmetry in the 12 o'clock position of the left breast is slightly larger and more irregular with spiculated margins. This currently measures 13 x 12 x  11 mm. On 09/21/2016, this measured 11 x 9 x 8 mm. This contains a coil shaped biopsy marker clip. Two-view twisted wire biopsy clips anterior to the currently demonstrated irregular mass-like asymmetry are unchanged, at the locations of previous benign biopsies. Post lumpectomy changes in the lower inner quadrant of the left breast are stable. Mammographic images were processed with CAD. On physical exam, no mass is palpable in the upper left breast or left axilla. Targeted ultrasound is performed, showing an irregular hypoechoic mass in the 12 o'clock position of the left breast 7 cm from the nipple, containing a biopsy marker clip and measuring 1.8 x 0.8 x 0.6 cm in maximum dimensions, corresponding to the recently biopsied malignancy. There is a second irregular hypoechoic mass-like area in the 12 o'clock position of the left breast, 4 cm from the nipple, measuring 1.1 x 0.6 x 0.6 cm in maximum dimensions, corresponding to the site of one or both previous biopsies. Ultrasound of the left axilla demonstrated normal appearing left axillary lymph nodes. IMPRESSION: 1. Mild increase in size of the biopsy-proven malignancy in the 12 o'clock position of the left breast, 7 cm from the nipple mammographically. This is also currently visible sonographically, measuring 1.8 x 0.8 x 0.6 cm. 2. No left axillary adenopathy seen RECOMMENDATION: If no intervention is undergone on at this time, a left diagnostic mammogram and left breast ultrasound could be obtained in 6 months or any clinically desired time period for follow-up. I have discussed the findings and recommendations with the patient. Results were also provided in writing at the conclusion of the visit. If applicable, a reminder letter will be sent to the patient regarding the next appointment. BI-RADS CATEGORY  6: Known biopsy-proven malignancy. Electronically Signed   By: Claudie Revering M.D.   On: 11/25/2016 13:18   Mm Diag Breast Tomo Uni  Left  Result Date:  11/25/2016 CLINICAL DATA:  Followup left breast cancer treated with Letrazole. EXAM: 2D DIGITAL DIAGNOSTIC LEFT MAMMOGRAM WITH CAD AND ADJUNCT TOMO ULTRASOUND LEFT BREAST COMPARISON:  Previous exam(s). ACR Breast Density Category c: The breast tissue is heterogeneously dense, which may obscure small masses. FINDINGS: The previously biopsied 1.1 cm asymmetry in the 12 o'clock position of the left breast is slightly larger and more irregular with spiculated margins. This currently measures 13 x 12 x 11 mm. On 09/21/2016, this measured 11 x 9 x 8 mm. This contains a coil shaped biopsy marker clip. Two-view twisted wire biopsy clips anterior to the currently demonstrated irregular mass-like asymmetry are unchanged, at the locations of previous benign biopsies. Post lumpectomy changes in the lower inner quadrant of the left breast are stable. Mammographic images were processed with CAD. On physical exam, no mass is palpable in the upper left breast or left axilla. Targeted ultrasound is performed, showing an irregular hypoechoic mass in the 12 o'clock position of the left breast 7 cm from the nipple, containing a biopsy marker clip and measuring 1.8 x 0.8 x 0.6 cm in maximum dimensions, corresponding to the recently biopsied malignancy. There is a second irregular hypoechoic mass-like area in the 12 o'clock position of the left breast, 4 cm from the nipple, measuring 1.1 x 0.6 x 0.6 cm in maximum dimensions, corresponding to the site of one or both previous biopsies. Ultrasound of the left axilla demonstrated normal appearing left axillary lymph nodes. IMPRESSION: 1. Mild increase in size of the biopsy-proven malignancy in the 12 o'clock position of the left breast, 7 cm from the nipple mammographically. This is also currently visible sonographically, measuring 1.8 x 0.8 x 0.6 cm. 2. No left axillary adenopathy seen RECOMMENDATION: If no intervention is undergone on at this time, a left diagnostic mammogram and left  breast ultrasound could be obtained in 6 months or any clinically desired time period for follow-up. I have discussed the findings and recommendations with the patient. Results were also provided in writing at the conclusion of the visit. If applicable, a reminder letter will be sent to the patient regarding the next appointment. BI-RADS CATEGORY  6: Known biopsy-proven malignancy. Electronically Signed   By: Claudie Revering M.D.   On: 11/25/2016 13:18    ELIGIBLE FOR AVAILABLE RESEARCH PROTOCOL: no  ASSESSMENT: 83 y.o.  woman  (1) status post left breast lower inner quadrant lumpectomy 2006, followed by radiation and brief course of anti-estrogens  (2) status post left breast overlapping sites biopsy 09/23/2016 for a clinical mT1b N0, grade 1 invasive ductal carcinoma, estrogen and progesterone receptor positive, HER-2 not amplified, with an MIB-15%  (3) definitive surgery pending  (4) letrozole started 10/13/2016  PLAN: We spent the better part of today's hour-long appointment discussing the treatmentf breast cancer in general, and the specifics of the patient's tumor in particular. The family in particular is interested in "alternatives" to mastectomy. This could include lumpectomy or no surgery and all. We reviewed the fact that lumpectomy alone, without radiation, is not equivalent to mastectomy and that the risk of local recurrence is higher. In addition it is not clear, given the extent of disease in the patient's left breast (nearly 7 cm) that a lumpectomy would be any easier on the patient surgically.  The family's concern is that the patient's dementia may be made significantly worse by anesthesia.  We establish the fact that aside from the dementia issue and of course her age,Kiara Nelson is "  strong as a horse" as her husband put it. In short she is not expected to die in the next 6-24 months. This is why short term measures are not a good idea. It may mean that if she has local  progression of her tumor with ulceration pain and so on in the breast she may need surgery at a time when she is physically and mentally even less capable of undergoing it  We discussed the possibility of starting anti-estrogens now. We discussed letrozole in particular and they have a good grasp of the possible toxicities, side effects and complications of this agent. We discussed the fact that in the past the patient was not able to tolerate anti-estrogens well and therefore it is a question whether this strategy (anti-estrogens alone, no surgery) might be viable. Nevertheless I think it is prudent to start letrozole now. It will give [place] information on the patient's tolerance. We will give [place] time to get the patient's families questions answered.  Given all this discussion my recommendation is that the patient start letrozole now but proceed to left mastectomy without sentinel lymph node sampling at the earliest agreeable time  Before proceeding to surgery the patient would like 3 questions addressed: First, candy mastectomy be done not under general anesthesia -- we will pose that question to Dr. Brantley Stage. Second if the patient does need general anesthesia for a brief period for left mastectomy is as expected to have a significant effect on her dementia. The family already has an appointment scheduled with Dr. Jannifer Franklin and they will address that question at that point. The third issue as already indicated is how the patient may tolerate letrozole. We will have the answer to that question by the time the patient returns to see me in approximately 6 weeks.  Accordingly the plan at this point is to start letrozole, strongly consider left mastectomy without sentinel lymph node sampling and then readdress the issue of letrozole: If the surgical results are optimal the patient may undergo observation instead of anti-estrogens which very likely she would prefer  Kiara Nelson and her family have  a good understanding of  the overall plan.They agree  with it.They know  the goal of treatment inthis  case is cure.They  will call with any problems that may develop before her next visit here, which will be in approximately 6 weeks.  Chauncey Cruel, MD   11/28/2016 10:12 AM Medical Oncology and Hematology [facility] [address] [place], [place] [address] Tel. [phone]    Fax. [phone]

## 2016-11-28 NOTE — Progress Notes (Signed)
[facility]  [phone] [phone]) [phone]     ID: [name] DOB: [birth year]  MR#: [id number]  YTK#:[id number]  Patient Care Team: Jani Gravel, MD as PCP - General (Internal Medicine) Erroll Luna, MD as Consulting Physician (General Surgery) Magrinat, Virgie Dad, MD as Consulting Physician (Oncology) Kathrynn Ducking, MD as Consulting Physician (Neurology) Chauncey Cruel, MD OTHER MD:  CHIEF COMPLAINT: Recurrent breast cancer in previously irradiated breast  CURRENT TREATMENT: Neoadjuvant letrozole   BREAST CANCER HISTORY: From the original intake note:  "Kiara Nelson" has a history of remote breast cancer, status post left lumpectomy in 2006 treated with adjuvant radiation then anti-estrogens briefly, discontinued by the patient because of sciatica concerns. I was her medical oncologist at that time. Marylene Buerger was her surgeon  More recently, on 09/13/2016 she had bilateral screening mammography with tomography at the [facility]. There were postoperative changes in the lower inner quadrant of the left breast. However in the upper central left breast was a possible mass and the patient was brought back on 09/21/2016 for unilateral left diagnostic mammography with tomography and left breast ultrasonography. The breast density was category C. At the 12:00 region of the left breast there was a 1.1 cm area of asymmetry associated with punctate calcifications. This was not palpable by exam. Ultrasound found no correlate. The left axilla was sonographically benign.  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 The invasive tumor was estrogen receptor positive at 100%, progesterone receptor positive at 20%, both with strong staining intensity,  with an MIB-1 of 5%, and no HER-2 amplification, the signals ratio being 1.13 and the number per cell 1.75 area  The patient's subsequent history is as detailed below.  INTERVAL HISTORY: Kiara Nelson returns today for follow-up of her recurrent estrogen receptor positive breast cancer, accompanied by her husband, daughter, and granddaughter (we just graduated from nursing school). Kiara Nelson is been on letrozole since her last visit here approximately 6 weeks ago. She has tolerated that well.   Hot flashes and vaginal dryness are not a major issue. She never developed the arthralgias or myalgias that many patients can experience on this medication. She obtains it at a good price.  Unfortunately the left mammography and [facility] shows disease progression. We're going to have to reconsider the issue of surgery  REVIEW OF SYSTEMS: Kiara Nelson is very interactive with me but the family tells me she has been spending a lot of time in bed covered by blanket. They feel she is depressed. The thing she is more confused. I have made an appointment with neurology not only to discuss the dementia issue further but also to discuss the concern to have regarding anesthesia. Otherwise a detailed review of systems today was entirely stable  PAST MEDICAL HISTORY: Past Medical History:  Diagnosis Date  . Anxiety   . Arthritis   . Back pain, chronic    lumbar up to thoracic area  . Breast cancer ([place])    left breast 2006  . Cancer [facility])    Breast cancer  . Chronic low back pain 04/30/2014  . Complex endometrial hyperplasia 2004   renal insufficiency  . Dementia   . Depression 04/30/2014  . Diabetes mellitus without complication ([place])   . Headache(784.0) 10/29/2013  .  Hypercholesteremia   . Hypertension   . Obese   . OSA on CPAP   . Personal history of radiation therapy    left breast 2006  . Recurrent cystitis     PAST SURGICAL HISTORY: Past Surgical History:  Procedure  Laterality Date  . BREAST BIOPSY Left    2018  . BREAST LUMPECTOMY Left    malignant 2006  . BREAST SURGERY    . CATARACT EXTRACTION, BILATERAL  09/2012  . CYSTOSCOPY  07/03/2012   Procedure: CYSTOSCOPY;  Surgeon: Reece Packer, MD;  Location: [facility];  Service: Urology;;  . [place] OF UTERUS  2004   polyp resection  . EXAMINATION UNDER ANESTHESIA  07/03/2012   Procedure: EXAM UNDER ANESTHESIA;  Surgeon: Reece Packer, MD;  Location: [facility];  Service: Urology;  Laterality: N/A;  . FOOT NEUROMA SURGERY    . TONSILLECTOMY  1962  . TUBAL LIGATION  1970  . VAGINAL HYSTERECTOMY  07/03/2012   Procedure: HYSTERECTOMY VAGINAL;  Surgeon: Peri Maris, MD;  Location: [facility];  Service: Gynecology;  Laterality: N/A;    FAMILY HISTORY Family History  Problem Relation Age of Onset  . Hypertension Mother   . Heart disease Mother   . Hypertension Father   . Dementia Father   . Depression Sister   The patient's parents died from causes unrelated to cancer. The patient has one sister, no brothers. A maternal grandmother and a maternal aunt were diagnosed with breast cancers in their 28s. There is no other history of breast cancer in the family and no ovarian cancer history to the family's knowledge   GYNECOLOGIC HISTORY:  Patient's last menstrual period was 07/18/1969 (approximate).  menarche age 45, first live birth age 12. The patient is GX P3. She never took hormone replacement. 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 Their oldest daughter lives in [place] and works as a Herbalist. Son Kiara Nelson lives in [place] and works for [employer]. Daughter Kiara Nelson lives in [place] and works in Engineer, technical sales. The patient has 4 grandsons and one daughter, granddaughter graduating from [employer] in nursing school  this year. The patient attend a local [place]: Not in place   HEALTH MAINTENANCE: Social History  Substance Use Topics  . Smoking status: Never Smoker  . Smokeless tobacco: Never Used  . Alcohol use 0.0 oz/week     Comment: rarely     Colonoscopy:  PAP:  Bone density:   Allergies  Allergen Reactions  . Bee Venom Swelling  . Penicillins Swelling    Swelling of hands  . Sulfa Antibiotics     Current Outpatient Prescriptions  Medication Sig Dispense Refill  . baclofen (LIORESAL) 10 MG tablet Take 5 mg by mouth 2 (two) times daily.     . Cholecalciferol (VITAMIN D3) 1000 UNITS CAPS Take by mouth daily.    . Cinnamon 500 MG capsule Take 500 mg by mouth daily.    Marland Kitchen desvenlafaxine (PRISTIQ) 100 MG 24 hr tablet Take 1 tablet (100 mg total) by mouth daily. 90 tablet 3  . donepezil (ARICEPT) 10 MG tablet TAKE ONE TABLET BY MOUTH AT BEDTIME 90 tablet 3  . fenofibrate (TRICOR) 48 MG tablet Take 48 mg by mouth daily.    Marland Kitchen HYDROCODONE-ACETAMINOPHEN PO Take 1 tablet by mouth as needed.    Marland Kitchen  letrozole (FEMARA) 2.5 MG tablet Take 1 tablet (2.5 mg total) by mouth daily. 90 tablet Share in [place]  . levothyroxine (SYNTHROID, LEVOTHROID) 100 MCG tablet Take 100 mcg by mouth daily before breakfast.    . levothyroxine (SYNTHROID, LEVOTHROID) 112 MCG tablet Take 112 mcg by mouth daily before breakfast.     . LORazepam (ATIVAN) 1 MG tablet Take 0.5 tablets by mouth at bedtime as needed and may repeat dose one time if needed.    Marland Kitchen losartan (COZAAR) 50 MG tablet Take 50 mg by mouth daily.    . Multiple Vitamins-Minerals (MULTIVITAMIN WITH MINERALS) tablet Take 1 tablet by mouth daily.    Marland Kitchen nystatin (MYCOSTATIN) powder Apply topically daily. Start using after skin has healed (Patient not taking: Reported on 10/13/2016) 15 g 2  . Omega-3 Fatty Acids (FISH OIL) 1000 MG CAPS Take 1 capsule by mouth 2 (two) times daily.    . Pyridoxine HCl (VITAMIN B6 PO) Take by mouth daily.     Marland Kitchen topiramate (TOPAMAX) 25 MG tablet Take 25 mg by mouth daily.    . traMADol (ULTRAM) 50 MG tablet Take 50 mg by mouth every 6 (six) hours as needed.      No current facility-administered medications for this visit.     OBJECTIVE: Older white woman who appears well   Vitals:   11/28/16 1428  BP: (!) 159/64  Pulse: 70  Resp: 17  Temp: 98.2 F (36.8 C)     Body mass index is 40.17 kg/m.    ECOG FS:0 - Asymptomatic  LAB RESULTS:  CMP     Component Value Date/Time   NA 139 07/03/2012 0617   K 3.9 07/03/2012 0617   CL 101 07/03/2012 0617   CO2 29 07/03/2012 0617   GLUCOSE 91 07/03/2012 0617   BUN 28 (H) 07/03/2012 0617   CREATININE 1.72 (H) 07/03/2012 0617   CALCIUM 10.1 07/03/2012 0617   PROT 6.9 07/03/2012 0617   ALBUMIN 3.9 07/03/2012 0617   AST 18 07/03/2012 0617   ALT 9 07/03/2012 0617   ALKPHOS 58 07/03/2012 0617   BILITOT 0.4 07/03/2012 0617   GFRNONAA 28 (L) 07/03/2012 0617   GFRAA 32 (L) 07/03/2012 0617    No results found for: TOTALPROTELP, ALBUMINELP, A1GS, A2GS, BETS, BETA2SER, GAMS, MSPIKE, SPEI  No results found for: Nils Pyle, [facility]  Lab Results  Component Value Date   WBC 6.4 07/03/2012   NEUTROABS 3.9 05/05/2012   HGB 13.3 07/03/2012   HCT 41.1 07/03/2012   MCV 92.6 07/03/2012   PLT 216 07/03/2012      Chemistry      Component Value Date/Time   NA 139 07/03/2012 0617   K 3.9 07/03/2012 0617   CL 101 07/03/2012 0617   CO2 29 07/03/2012 0617   BUN 28 (H) 07/03/2012 0617   CREATININE 1.72 (H) 07/03/2012 0617      Component Value Date/Time   CALCIUM 10.1 07/03/2012 0617   ALKPHOS 58 07/03/2012 0617   AST 18 07/03/2012 0617   ALT 9 07/03/2012 0617   BILITOT 0.4 07/03/2012 0617       Lab Results  Component Value Date   LABCA2 12 05/08/2008    No components found for: [id number]  No results for input(s): INR in the last 168 hours.  Urinalysis    Component Value Date/Time   COLORURINE YELLOW 05/05/2012  1658   APPEARANCEUR CLEAR 05/05/2012 1658   LABSPEC 1.011 05/05/2012 1658   PHURINE 6.0 05/05/2012 1658  GLUCOSEU NEGATIVE 05/05/2012 [place] 05/05/2012 1658   BILIRUBINUR n 04/09/2015 1109   KETONESUR NEGATIVE 05/05/2012 1658   PROTEINUR n 04/09/2015 1109   PROTEINUR NEGATIVE 05/05/2012 1658   UROBILINOGEN negative 04/09/2015 1109   UROBILINOGEN 0.2 05/05/2012 1658   NITRITE trace 04/09/2015 1109   NITRITE NEGATIVE 05/05/2012 1658   LEUKOCYTESUR moderate (2+) (A) 04/09/2015 1109     STUDIES: US Breast Ltd Uni Left Inc Axilla  Result Date: 11/25/2016 CLINICAL DATA:  Followup left breast cancer treated with Letrazole. EXAM: 2D DIGITAL DIAGNOSTIC LEFT MAMMOGRAM WITH CAD AND ADJUNCT TOMO ULTRASOUND LEFT BREAST COMPARISON:  Previous exam(s). ACR Breast Density Category c: The breast tissue is heterogeneously dense, which may obscure small masses. FINDINGS: The previously biopsied 1.1 cm asymmetry in the 12 o'clock position of the left breast is slightly larger and more irregular with spiculated margins. This currently measures 13 x 12 x 11 mm. On 09/21/2016, this measured 11 x 9 x 8 mm. This contains a coil shaped biopsy marker clip. Two-view twisted wire biopsy clips anterior to the currently demonstrated irregular mass-like asymmetry are unchanged, at the locations of previous benign biopsies. Post lumpectomy changes in the lower inner quadrant of the left breast are stable. Mammographic images were processed with CAD. On physical exam, no mass is palpable in the upper left breast or left axilla. Targeted ultrasound is performed, showing an irregular hypoechoic mass in the 12 o'clock position of the left breast 7 cm from the nipple, containing a biopsy marker clip and measuring 1.8 x 0.8 x 0.6 cm in maximum dimensions, corresponding to the recently biopsied malignancy. There is a second irregular hypoechoic mass-like area in the 12 o'clock position of the left breast, 4 cm from the  nipple, measuring 1.1 x 0.6 x 0.6 cm in maximum dimensions, corresponding to the site of one or both previous biopsies. Ultrasound of the left axilla demonstrated normal appearing left axillary lymph nodes. IMPRESSION: 1. Mild increase in size of the biopsy-proven malignancy in the 12 o'clock position of the left breast, 7 cm from the nipple mammographically. This is also currently visible sonographically, measuring 1.8 x 0.8 x 0.6 cm. 2. No left axillary adenopathy seen RECOMMENDATION: If no intervention is undergone on at this time, a left diagnostic mammogram and left breast ultrasound could be obtained in 6 months or any clinically desired time period for follow-up. I have discussed the findings and recommendations with the patient. Results were also provided in writing at the conclusion of the visit. If applicable, a reminder letter will be sent to the patient regarding the next appointment. BI-RADS CATEGORY  6: Known biopsy-proven malignancy. Electronically Signed   By: Claudie Revering M.D.   On: 11/25/2016 13:18   Mm Diag Breast Tomo Uni Left  Result Date: 11/25/2016 CLINICAL DATA:  Followup left breast cancer treated with Letrazole. EXAM: 2D DIGITAL DIAGNOSTIC LEFT MAMMOGRAM WITH CAD AND ADJUNCT TOMO ULTRASOUND LEFT BREAST COMPARISON:  Previous exam(s). ACR Breast Density Category c: The breast tissue is heterogeneously dense, which may obscure small masses. FINDINGS: The previously biopsied 1.1 cm asymmetry in the 12 o'clock position of the left breast is slightly larger and more irregular with spiculated margins. This currently measures 13 x 12 x 11 mm. On 09/21/2016, this measured 11 x 9 x 8 mm. This contains a coil shaped biopsy marker clip. Two-view twisted wire biopsy clips anterior to the currently demonstrated irregular mass-like asymmetry are unchanged, at the locations of previous benign biopsies. Post  lumpectomy changes in the lower inner quadrant of the left breast are stable. Mammographic  images were processed with CAD. On physical exam, no mass is palpable in the upper left breast or left axilla. Targeted ultrasound is performed, showing an irregular hypoechoic mass in the 12 o'clock position of the left breast 7 cm from the nipple, containing a biopsy marker clip and measuring 1.8 x 0.8 x 0.6 cm in maximum dimensions, corresponding to the recently biopsied malignancy. There is a second irregular hypoechoic mass-like area in the 12 o'clock position of the left breast, 4 cm from the nipple, measuring 1.1 x 0.6 x 0.6 cm in maximum dimensions, corresponding to the site of one or both previous biopsies. Ultrasound of the left axilla demonstrated normal appearing left axillary lymph nodes. IMPRESSION: 1. Mild increase in size of the biopsy-proven malignancy in the 12 o'clock position of the left breast, 7 cm from the nipple mammographically. This is also currently visible sonographically, measuring 1.8 x 0.8 x 0.6 cm. 2. No left axillary adenopathy seen RECOMMENDATION: If no intervention is undergone on at this time, a left diagnostic mammogram and left breast ultrasound could be obtained in 6 months or any clinically desired time period for follow-up. I have discussed the findings and recommendations with the patient. Results were also provided in writing at the conclusion of the visit. If applicable, a reminder letter will be sent to the patient regarding the next appointment. BI-RADS CATEGORY  6: Known biopsy-proven malignancy. Electronically Signed   By: Claudie Revering M.D.   On: 11/25/2016 13:18    ELIGIBLE FOR AVAILABLE RESEARCH PROTOCOL: no  ASSESSMENT: 83 y.o. [place] woman  (1) status post left breast lower inner quadrant lumpectomy 2006, followed by radiation and brief course of anti-estrogens  (2) status post left breast overlapping sites biopsy 09/23/2016 for a clinical mT1b N0, grade 1 invasive ductal carcinoma, estrogen and progesterone receptor positive, HER-2 not amplified, with  an MIB-15%  (3) definitive surgery pending  (4) letrozole started 10/13/2016  PLAN: I spent approximately 30 minutes with Kiara Nelson and her family with most of that time spent discussing her upcoming surgical decision. To summarize: She has had prior radiation to the left breast, where she now has a new mass. The standard of care in this case is mastectomy. However this would involve anesthesia and the family is very concerned the patient's dementia will accelerate irreversibly because of anesthesia.  Alternatively we could consider a lumpectomy under local. There would be a somewhat higher risk of local recurrence because she would not be able to receive adjuvant radiation.  To try to bypass this 18 we started her on letrozole and she has taken that regularly. She has tolerated it remarkably well. Unfortunately the ultrasound we just obtained shows disease progression, not response.  Accordingly we are stopping the letrozole and we are back to the original options.  The patient is scheduled to see neurology later this week to clarify the dementia issue and particularly the question regarding anesthesia.  Hopefully she will be able to see Dr. Brantley Stage ASAP. If lumpectomy is possible and we obtain clear margins I would then follow her with ultrasonography every 6 months for 2 years and then yearly. If she developed a new mass or local recurrence we could consider tamoxifen possibly under cover of a blood thinner, but observation alone would be the preferred option  The family has a good understanding of this plan. They are in agreement with it. The patient herself is rather  eager to proceed to surgery.  I'm going to see them again in June. They know to call for any problems that may develop before the next visit here.   Chauncey Cruel, MD   11/28/2016 2:31 PM Medical Oncology and Hematology [facility] [address] [place], [place] [address] Tel. [phone]    Fax.  [phone]

## 2016-11-30 ENCOUNTER — Ambulatory Visit (INDEPENDENT_AMBULATORY_CARE_PROVIDER_SITE_OTHER): Payer: Medicare Other | Admitting: Adult Health

## 2016-11-30 ENCOUNTER — Encounter: Payer: Self-pay | Admitting: Adult Health

## 2016-11-30 VITALS — BP 158/81 | HR 99 | Ht 63.5 in | Wt 233.6 lb

## 2016-11-30 DIAGNOSIS — R413 Other amnesia: Secondary | ICD-10-CM | POA: Diagnosis not present

## 2016-11-30 NOTE — Progress Notes (Signed)
I have read the note, and I agree with the clinical assessment and plan.  WILLIS,CHARLES KEITH   

## 2016-11-30 NOTE — Patient Instructions (Signed)
Continue Aricept  Consider starting Namenda 5 mg twice a day If your symptoms worsen or you develop new symptoms please let us know.   Memantine Tablets What is this medicine? MEMANTINE (MEM an teen) is used to treat dementia caused by Alzheimer's disease. This medicine may be used for other purposes; ask your health care provider or pharmacist if you have questions. COMMON BRAND NAME(S): Namenda What should I tell my health care provider before I take this medicine? They need to know if you have any of these conditions: -difficulty passing urine -kidney disease -liver disease -seizures -an unusual or allergic reaction to memantine, other medicines, foods, dyes, or preservatives -pregnant or trying to get pregnant -breast-feeding How should I use this medicine? Take this medicine by mouth with a glass of water. Follow the directions on the prescription label. You may take this medicine with or without food. Take your doses at regular intervals. Do not take your medicine more often than directed. Continue to take your medicine even if you feel better. Do not stop taking except on the advice of your doctor or health care professional. Talk to your pediatrician regarding the use of this medicine in children. Special care may be needed. Overdosage: If you think you have taken too much of this medicine contact a poison control center or emergency room at once. NOTE: This medicine is only for you. Do not share this medicine with others. What if I miss a dose? If you miss a dose, take it as soon as you can. If it is almost time for your next dose, take only that dose. Do not take double or extra doses. If you do not take your medicine for several days, contact your health care provider. Your dose may need to be changed. What may interact with this  medicine? -acetazolamide -amantadine -cimetidine -dextromethorphan -dofetilide -hydrochlorothiazide -ketamine -metformin -methazolamide -quinidine -ranitidine -sodium bicarbonate -triamterene This list may not describe all possible interactions. Give your health care provider a list of all the medicines, herbs, non-prescription drugs, or dietary supplements you use. Also tell them if you smoke, drink alcohol, or use illegal drugs. Some items may interact with your medicine. What should I watch for while using this medicine? Visit your doctor or health care professional for regular checks on your progress. Check with your doctor or health care professional if there is no improvement in your symptoms or if they get worse. You may get drowsy or dizzy. Do not drive, use machinery, or do anything that needs mental alertness until you know how this drug affects you. Do not stand or sit up quickly, especially if you are an older patient. This reduces the risk of dizzy or fainting spells. Alcohol can make you more drowsy and dizzy. Avoid alcoholic drinks. What side effects may I notice from receiving this medicine? Side effects that you should report to your doctor or health care professional as soon as possible: -allergic reactions like skin rash, itching or hives, swelling of the face, lips, or tongue -agitation or a feeling of restlessness -depressed mood -dizziness -hallucinations -redness, blistering, peeling or loosening of the skin, including inside the mouth -seizures -vomiting Side effects that usually do not require medical attention (report to your doctor or health care professional if they continue or are bothersome): -constipation -diarrhea -headache -nausea -trouble sleeping This list may not describe all possible side effects. Call your doctor for medical advice about side effects. You may report side effects to FDA at 1-800-FDA-1088. Where should I  keep my medicine? Keep  out of the reach of children. Store at room temperature between 15 degrees and 30 degrees C (59 degrees and 86 degrees F). Throw away any unused medicine after the expiration date. NOTE: This sheet is a summary. It may not cover all possible information. If you have questions about this medicine, talk to your doctor, pharmacist, or health care provider.  2018 Elsevier/Gold Standard (2013-04-22 14:10:42)

## 2016-11-30 NOTE — Progress Notes (Signed)
PATIENT: Kiara Nelson DOB: [birth year]  REASON FOR VISIT: follow up- memory disturbance HISTORY FROM: patient  HISTORY OF PRESENT ILLNESS: Kiara Nelson is a 83 year old female with a history of progressive memory disorder. She returns today for follow-up. She is currently taking Aricept 10 mg at bedtime. The patient feels that her memory may have gotten a little worse. She lives at home with her husband. She is able to complete all ADLs independently although she does require some prompting. She has a good appetite. She sleeps okay. Denies having to give up anything due to her memory. Her husband reports that she does sleep a lot. She has been diagnosed with obstructive sleep apnea however she has not been using the CPAP. She continues to wake up with headaches. She reports that her headaches get better as the day goes on. She has tried baclofen with no benefit. She states that she has been diagnosed with breast cancer again and will undergo a mastectomy or lumpectomy within the coming weeks. She returns today for an evaluation.  HISTORY 05/02/16: Kiara Nelson is an 83 year old right-handed white female with a history of a progressive memory disorder. The patient has continued to worsen with short-term memory. She has had chronic sinus type headaches that will clear with warm compresses and sinus drainage. The patient recently fell and bumped the top of her head, she has had some headaches on the top of the head since that time, a CT scan of the brain was unremarkable. The patient now has only soreness on the top of the head, no headaches otherwise. She has had some low back pain off and on. The patient is on Aricept and Vyacog. The husband does not believe that the Vyacog has been beneficial. She returns for an evaluation.   REVIEW OF SYSTEMS: Out of a complete 14 system review of symptoms, the patient complains only of the following symptoms, and all other reviewed systems are negative.  Memory  loss, headache, confusion, depression, hallucinations, ringing in ears    ALLERGIES: Allergies  Allergen Reactions  . Bee Venom Swelling  . Penicillins Swelling    Swelling of hands  . Sulfa Antibiotics     HOME MEDICATIONS: Outpatient Medications Prior to Visit  Medication Sig Dispense Refill  . Cholecalciferol (VITAMIN D3) 1000 UNITS CAPS Take by mouth daily.    . Cinnamon 500 MG capsule Take 500 mg by mouth daily.    Marland Kitchen desvenlafaxine (PRISTIQ) 100 MG 24 hr tablet Take 1 tablet (100 mg total) by mouth daily. 90 tablet 3  . donepezil (ARICEPT) 10 MG tablet TAKE ONE TABLET BY MOUTH AT BEDTIME 90 tablet 3  . fenofibrate (TRICOR) 48 MG tablet Take 48 mg by mouth daily.    Marland Kitchen levothyroxine (SYNTHROID, LEVOTHROID) 100 MCG tablet Take 100 mcg by mouth daily before breakfast.    . levothyroxine (SYNTHROID, LEVOTHROID) 112 MCG tablet Take 112 mcg by mouth daily before breakfast.     . LORazepam (ATIVAN) 1 MG tablet Take 0.5 tablets by mouth at bedtime as needed and may repeat dose one time if needed.    Marland Kitchen losartan (COZAAR) 50 MG tablet Take 50 mg by mouth daily.    . Multiple Vitamins-Minerals (MULTIVITAMIN WITH MINERALS) tablet Take 1 tablet by mouth daily.    . Omega-3 Fatty Acids (FISH OIL) 1000 MG CAPS Take 1 capsule by mouth 2 (two) times daily.    . traMADol (ULTRAM) 50 MG tablet Take 50 mg by mouth every 6 (six)  hours as needed.     . baclofen (LIORESAL) 10 MG tablet Take 5 mg by mouth 2 (two) times daily.     Marland Kitchen HYDROCODONE-ACETAMINOPHEN PO Take 1 tablet by mouth as needed.    . nystatin (MYCOSTATIN) powder Apply topically daily. Start using after skin has healed (Patient not taking: Reported on 10/13/2016) 15 g 2  . Pyridoxine HCl (VITAMIN B6 PO) Take by mouth daily.    Marland Kitchen topiramate (TOPAMAX) 25 MG tablet Take 25 mg by mouth daily.     No facility-administered medications prior to visit.     PAST MEDICAL HISTORY: Past Medical History:  Diagnosis Date  . Anxiety   . Arthritis     . Back pain, chronic    lumbar up to thoracic area  . Breast cancer ([place])    left breast 2006  . Cancer [facility])    Breast cancer  . Chronic low back pain 04/30/2014  . Complex endometrial hyperplasia 2004   renal insufficiency  . Dementia   . Depression 04/30/2014  . Diabetes mellitus without complication ([place])   . Headache(784.0) 10/29/2013  . Hypercholesteremia   . Hypertension   . Obese   . OSA on CPAP   . Personal history of radiation therapy    left breast 2006  . Recurrent cystitis     PAST SURGICAL HISTORY: Past Surgical History:  Procedure Laterality Date  . BREAST BIOPSY Left    2018  . BREAST LUMPECTOMY Left    malignant 2006  . BREAST SURGERY    . CATARACT EXTRACTION, BILATERAL  09/2012  . CYSTOSCOPY  07/03/2012   Procedure: CYSTOSCOPY;  Surgeon: Reece Packer, MD;  Location: [facility];  Service: Urology;;  . [place] OF UTERUS  2004   polyp resection  . EXAMINATION UNDER ANESTHESIA  07/03/2012   Procedure: EXAM UNDER ANESTHESIA;  Surgeon: Reece Packer, MD;  Location: [facility];  Service: Urology;  Laterality: N/A;  . FOOT NEUROMA SURGERY    . TONSILLECTOMY  1962  . TUBAL LIGATION  1970  . VAGINAL HYSTERECTOMY  07/03/2012   Procedure: HYSTERECTOMY VAGINAL;  Surgeon: Peri Maris, MD;  Location: [facility];  Service: Gynecology;  Laterality: N/A;    FAMILY HISTORY: Family History  Problem Relation Age of Onset  . Hypertension Mother   . Heart disease Mother   . Hypertension Father   . Dementia Father   . Depression Sister     SOCIAL HISTORY: Social History   Social History  . Marital status: Married    Spouse name: N/A  . Number of children: 3  . Years of education: college   Occupational History  . Retired Retired   Social History Main Topics  . Smoking status: Never Smoker  . Smokeless tobacco: Never Used  . Alcohol use 0.0 oz/week     Comment: rarely  . Drug use: No  . Sexual activity: Yes    Partners: Male     Birth control/ protection: Post-menopausal, Surgical     Comment: [facility]   Other Topics Concern  . Not on file   Social History Narrative   Patient lives at home with her husband Gwyndolyn Saxon).   Education college.   Retired. School Pharmacist, hospital.   Right handed.   Caffeine -None.      PHYSICAL EXAM  Vitals:   11/30/16 1421  BP: (!) 158/81  Pulse: 99  Weight: 233 lb 9.6 oz (106 kg)  Height: 5' 3.5" (1.613 m)   Body  mass index is 40.73 kg/m.   MMSE - Mini Mental State Exam 11/30/2016 10/27/2015 05/04/2015  Orientation to time 0 0 2  Orientation to Place 5 3 3   Registration 3 3 3   Attention/ Calculation 1 3 5   Recall 0 2 0  Language- name 2 objects 2 2 2   Language- repeat 1 1 0  Language- follow 3 step command 3 3 3   Language- read & follow direction 1 1 1   Write a sentence 1 1 1   Copy design 1 1 1   Total score 18 20 21      Generalized: Well developed, in no acute distress   Neurological examination  Mentation: Alert.  Follows all commands speech and language fluent Cranial nerve II-XII: Pupils were equal round reactive to light. Extraocular movements were full, visual field were full on confrontational test. Facial sensation and strength were normal. Uvula tongue midline. Head turning and shoulder shrug  were normal and symmetric. Motor: The motor testing reveals 5 over 5 strength of all 4 extremities. Good symmetric motor tone is noted throughout.  Sensory: Sensory testing is intact to soft touch on all 4 extremities. No evidence of extinction is noted.  Coordination: Cerebellar testing reveals good finger-nose-finger and heel-to-shin bilaterally.  Gait and station: Gait is normal.    DIAGNOSTIC DATA (LABS, IMAGING, TESTING) - I reviewed patient records, labs, notes, testing and imaging myself where available.  Lab Results  Component Value Date   WBC 6.4 07/03/2012   HGB 13.3 07/03/2012   HCT 41.1 07/03/2012   MCV 92.6 07/03/2012   PLT 216 07/03/2012      Component  Value Date/Time   NA 139 07/03/2012 0617   K 3.9 07/03/2012 0617   CL 101 07/03/2012 0617   CO2 29 07/03/2012 0617   GLUCOSE 91 07/03/2012 0617   BUN 28 (H) 07/03/2012 0617   CREATININE 1.72 (H) 07/03/2012 0617   CALCIUM 10.1 07/03/2012 0617   PROT 6.9 07/03/2012 0617   ALBUMIN 3.9 07/03/2012 0617   AST 18 07/03/2012 0617   ALT 9 07/03/2012 0617   ALKPHOS 58 07/03/2012 0617   BILITOT 0.4 07/03/2012 0617   GFRNONAA 28 (L) 07/03/2012 0617   GFRAA 32 (L) 07/03/2012 0617      ASSESSMENT AND PLAN 83 y.o. year old female  has a past medical history of Anxiety; Arthritis; Back pain, chronic; Breast cancer ([place]); Cancer ([place]); Chronic low back pain (04/30/2014); Complex endometrial hyperplasia (2004); Dementia; Depression (04/30/2014); Diabetes mellitus without complication ([place]); CBSWHQPR(916.3) (10/29/2013); Hypercholesteremia; Hypertension; Obese; OSA on CPAP; Personal history of radiation therapy; and Recurrent cystitis. here with:  1. Memory disturbance  The patient's memory score has declined slightly. She will continue on Aricept 10 mg at bedtime. We discussed starting Namenda. The patient and her family would like to check with her oncologist before initiating this medication. They also report that her last lab work indicated some kidney abnormalities. I have asked that they have these results faxed to our office. Patient is advised that if her symptoms worsen or she develops new symptoms she should let us know. She will follow-up in 6 months or sooner if needed.   I spent 15 minutes with the patient 50% of this time was spent discussing Namenda.   Ward Givens, MSN, NP-C 11/30/2016, 3:07 PM [facility] [address], [place], [place] [address] [phone]

## 2016-12-09 ENCOUNTER — Ambulatory Visit: Payer: Self-pay | Admitting: Surgery

## 2016-12-09 ENCOUNTER — Encounter: Payer: Self-pay | Admitting: Adult Health

## 2016-12-09 DIAGNOSIS — C50512 Malignant neoplasm of lower-outer quadrant of left female breast: Secondary | ICD-10-CM

## 2016-12-09 DIAGNOSIS — Z170 Estrogen receptor positive status [ER+]: Principal | ICD-10-CM

## 2016-12-09 NOTE — H&P (Signed)
Kiara Nelson 12/09/2016 10:00 AM Location: [facility] Patient #: [id number] DOB: 10/13/1933 Married / Language: English / Race: White Female  History of Present Illness Marcello Moores A. Skippy Marhefka MD; 12/09/2016 11:01 AM) Patient words: Patient returns for follow-up of her records left breast cancer. She was seen in March a left mastectomy was recommended. The patient and family were concerned about her Alzheimer's and opted for anti-estrogens. Unfortunately, she has not had reduction in size of left breast cancer. They have opted now for left simple mastectomy. She's been seen by neurology which is cleared her for surgery. Her family has concerns about her worsening dementia after surgery. We had a long discussion about using blocks reducing the amount of medication that will hopefully reduce her risk of exacerbating her underlying Alzheimer's disease. She is otherwise in good spirits.  The patient is a 83 year old female.   Allergies Malachy Moan, RMA; 12/09/2016 10:01 AM) Penicillin G Benzathine & Proc *PENICILLINS* Swelling. Allergies Reconciled  Medication History Malachy Moan, RMA; 12/09/2016 10:02 AM) Donepezil HCl (10MG  Tablet, Oral daily) Active. Topiramate (25MG  Tablet, Oral daily) Active. Losartan Potassium (50MG  Tablet, Oral daily) Active. Levothyroxine Sodium (100MCG Tablet, Oral daily) Active. Fenofibrate (48MG  Tablet, Oral daily) Active. Levothyroxine Sodium (112MCG Tablet, Oral daily) Active. Desvenlafaxine Succinate ER (100MG  Tablet ER 24HR, Oral daily) Active. Omega 3 (1000MG  Capsule, Oral daily) Active. Multi-Minerals (Oral daily) Active. B6 Natural (100MG  Tablet, Oral daily) Active. D3-1000 (1000UNIT Capsule, Oral daily) Active. LORazepam (1MG  Tablet, Oral) Active. Medications Reconciled    Vitals Malachy Moan RMA; 12/09/2016 10:02 AM) 12/09/2016 10:02 AM Weight: 233.4 lb Height: 64in Body Surface Area: 2.09 m Body  Mass Index: 40.06 kg/m  Temp.: 98.34F  Pulse: 102 (Regular)  BP: 130/80 (Sitting, Left Arm, Standard)      Physical Exam (Evyn Kooyman A. Tammee Thielke MD; 12/09/2016 11:02 AM)  General Mental Status-Alert. General Appearance-Consistent with stated age. Hydration-Well hydrated. Voice-Normal.  Head and Neck Head-normocephalic, atraumatic with no lesions or palpable masses. Trachea-midline. Thyroid Gland Characteristics - normal size and consistency.  Chest and Lung Exam Chest and lung exam reveals -quiet, even and easy respiratory effort with no use of accessory muscles and on auscultation, normal breath sounds, no adventitious sounds and normal vocal resonance. Inspection Chest Wall - Normal. Back - normal.  Breast Note: Left breast shows a indeterminate mass size in the central upper breast. This feels to be about 2-3 cm. Left axilla normal. Tattoos from left radiation noted. Right breast is normal.  Neurologic Note: Appropriate. Has very difficult time with short-term memory. Family with her today.  Musculoskeletal Normal Exam - Left-Upper Extremity Strength Normal and Lower Extremity Strength Normal. Normal Exam - Right-Upper Extremity Strength Normal and Lower Extremity Strength Normal.  Lymphatic Head & Neck  General Head & Neck Lymphatics: Bilateral - Description - Normal. Axillary  General Axillary Region: Bilateral - Description - Normal. Tenderness - Non Tender.    Assessment & Plan (Sahalie Beth A. Derrick Tiegs MD; 12/09/2016 11:02 AM)  BREAST CANCER, LEFT (C50.912) Impression: The patient's only option is left simple mastectomy. Discussed the role of lymph node removal as well with her. Risks, benefits and old chart is discussed. This is a low-grade tumor with a large area of DCIS as well. Cc had previous radiation therapy lumpectomy unlikely in this circumstance especially given the large area and reduce breast size and the left. Discussed treatment  options for breast cancer to include breast conservation vs mastectomy with reconstruction. Pt has decided on mastectomy. Risk include bleeding,  infection, flap necrosis, pain, numbness, recurrence, hematoma, other surgery needs. Pt understands and agrees to proceed.  Current Plans Pt Education - [facility] Mastectomy HCI You are being scheduled for surgery- Our schedulers will call you.  You should hear from our office's scheduling department within 5 working days about the location, date, and time of surgery. We try to make accommodations for patient's preferences in scheduling surgery, but sometimes the OR schedule or the surgeon's schedule prevents [place] from making those accommodations.  If you have not heard from our office [phone]) in 5 working days, call the office and ask for your surgeon's nurse.  If you have other questions about your diagnosis, plan, or surgery, call the office and ask for your surgeon's nurse.  We discussed the staging and pathophysiology of breast cancer. We discussed all of the different options for treatment for breast cancer including surgery, chemotherapy, radiation therapy, Herceptin, and antiestrogen therapy. We discussed a sentinel lymph node biopsy as she does not appear to having lymph node involvement right now. We discussed the performance of that with injection of radioactive tracer and blue dye. We discussed that she would have an incision underneath her axillary hairline. We discussed that there is a bout a 10-20% chance of having a positive node with a sentinel lymph node biopsy and we will await the permanent pathology to make any other first further decisions in terms of her treatment. One of these options might be to return to the operating room to perform an axillary lymph node dissection. We discussed about a 1-2% risk lifetime of chronic shoulder pain as well as lymphedema associated with a sentinel lymph node biopsy. We discussed the options for  treatment of the breast cancer which included lumpectomy versus a mastectomy. We discussed the performance of the lumpectomy with a wire placement. We discussed a 10-20% chance of a positive margin requiring reexcision in the operating room. We also discussed that she may need radiation therapy or antiestrogen therapy or both if she undergoes lumpectomy. We discussed the mastectomy and the postoperative care for that as well. We discussed that there is no difference in her survival whether she undergoes lumpectomy with radiation therapy or antiestrogen therapy versus a mastectomy. There is a slight difference in the local recurrence rate being 3-5% with lumpectomy and about 1% with a mastectomy. We discussed the risks of operation including bleeding, infection, possible reoperation. She understands her further therapy will be based on what her stages at the time of her operation.  Pt Education - [facility] Mastectomy HCI Pt Education - ABC (After Breast Cancer) Class Info: discussed with patient and provided information.

## 2016-12-14 ENCOUNTER — Telehealth: Payer: Self-pay | Admitting: Oncology

## 2016-12-14 NOTE — Telephone Encounter (Signed)
lvm to inform pt of 6/28 appt at 150 per sch msg

## 2016-12-19 NOTE — Pre-Procedure Instructions (Addendum)
Kiara Nelson  12/19/2016      Kiara (SE), [facility] [address] [place]) [place] [address] Phone: [phone] Fax: [phone]    Your procedure is scheduled on Thursday, December 22, 2016.  Report to [facility] Admitting at 7:00 A.M.  Call this number if you have problems the morning of surgery:  [phone]   Remember:  Do not eat food or drink liquids after midnight.  Take these medicines the morning of surgery with A SIP OF WATER levothyroxine (Synthroid); and if needed tramadol (Ultram), lorazepam (Ativan), and Hydrocodone-acetaminophen (Norco/Vicodin)   7 days prior to surgery STOP taking any Aspirin, Aleve, Naproxen, Ibuprofen, Motrin, Advil, Goody's, BC's, all herbal medications, fish oil, all vitamins, baclofen (Lioresal)   Do not wear jewelry, make-up or nail polish.  Do not wear lotions, powders, or perfumes, or deoderant.  Do not shave 48 hours prior to surgery.  Men may shave face and neck.  Do not bring valuables to the hospital.  [facility] is not responsible for any belongings or valuables.  Contacts, dentures or bridgework may not be worn into surgery.  Leave your suitcase in the car.  After surgery it may be brought to your room.  For patients admitted to the hospital, discharge time will be determined by your treatment team.  Patients discharged the day of surgery will not be allowed to drive home.   Name and phone number of your driver:     Special instructions:     [place]- Preparing For Surgery  Before surgery, you can play an important role. Because skin is not sterile, your skin needs to be as free of germs as possible. You can reduce the number of germs on your skin by washing with CHG (chlorahexidine gluconate) Soap before surgery.  CHG is an antiseptic cleaner which kills germs and bonds with the skin to continue killing germs even after washing.  Please do not use if you  have an allergy to CHG or antibacterial soaps. If your skin becomes reddened/irritated stop using the CHG.  Do not shave (including legs and underarms) for at least 48 hours prior to first CHG shower. It is OK to shave your face.  Please follow these instructions carefully.   1. Shower the NIGHT BEFORE SURGERY and the MORNING OF SURGERY with CHG.   2. If you chose to wash your hair, wash your hair first as usual with your normal shampoo.  3. After you shampoo, rinse your hair and body thoroughly to remove the shampoo.  4. Use CHG as you would any other liquid soap. You can apply CHG directly to the skin and wash gently with a scrungie or a clean washcloth.   5. Apply the CHG Soap to your body ONLY FROM THE NECK DOWN.  Do not use on open wounds or open sores. Avoid contact with your eyes, ears, mouth and genitals (private parts). Wash genitals (private parts) with your normal soap.  6. Wash thoroughly, paying special attention to the area where your surgery will be performed.  7. Thoroughly rinse your body with warm water from the neck down.  8. DO NOT shower/wash with your normal soap after using and rinsing off the CHG Soap.  9. Pat yourself dry with a CLEAN TOWEL.   10. Wear CLEAN PAJAMAS   11. Place CLEAN SHEETS on your bed the night of your first shower and DO NOT SLEEP WITH  PETS.    Day of Surgery: Do not apply any deodorants/lotions. Please wear clean clothes to the hospital/surgery center.        How to Manage Your Diabetes Before and After Surgery  Why is it important to control my blood sugar before and after surgery? . Improving blood sugar levels before and after surgery helps healing and can limit problems. . A way of improving blood sugar control is eating a healthy diet by: o  Eating less sugar and carbohydrates o  Increasing activity/exercise o  Talking with your doctor about reaching your blood sugar goals . High blood sugars (greater than 180 mg/dL) can  raise your risk of infections and slow your recovery, so you will need to focus on controlling your diabetes during the weeks before surgery. . Make sure that the doctor who takes care of your diabetes knows about your planned surgery including the date and location.  How do I manage my blood sugar before surgery? . Check your blood sugar at least 4 times a day, starting 2 days before surgery, to make sure that the level is not too high or low. o Check your blood sugar the morning of your surgery when you wake up and every 2 hours until you get to the Short Stay unit. . If your blood sugar is less than 70 mg/dL, you will need to treat for low blood sugar: o Do not take insulin. o Treat a low blood sugar (less than 70 mg/dL) with  cup of clear juice (cranberry or apple), 4 glucose tablets, OR glucose gel. o Recheck blood sugar in 15 minutes after treatment (to make sure it is greater than 70 mg/dL). If your blood sugar is not greater than 70 mg/dL on recheck, call [phone] for further instructions. . Report your blood sugar to the short stay nurse when you get to Short Stay.  . If you are admitted to the hospital after surgery: o Your blood sugar will be checked by the staff and you will probably be given insulin after surgery (instead of oral diabetes medicines) to make sure you have good blood sugar levels. o The goal for blood sugar control after surgery is 80-180 mg/dL.  Please read over the following fact sheets that you were given. Pain Booklet and Surgical Site Infection Prevention

## 2016-12-20 ENCOUNTER — Encounter (HOSPITAL_COMMUNITY): Payer: Self-pay

## 2016-12-20 ENCOUNTER — Encounter (HOSPITAL_COMMUNITY)
Admission: RE | Admit: 2016-12-20 | Discharge: 2016-12-20 | Disposition: A | Discharge status: Home / Self Care | Payer: Medicare Other | Source: Ambulatory Visit | Attending: Surgery | Admitting: Surgery

## 2016-12-20 DIAGNOSIS — I10 Essential (primary) hypertension: Secondary | ICD-10-CM | POA: Diagnosis not present

## 2016-12-20 DIAGNOSIS — D0512 Intraductal carcinoma in situ of left breast: Secondary | ICD-10-CM | POA: Diagnosis not present

## 2016-12-20 DIAGNOSIS — Z6841 Body Mass Index (BMI) 40.0 and over, adult: Secondary | ICD-10-CM | POA: Diagnosis not present

## 2016-12-20 DIAGNOSIS — Z880 Allergy status to penicillin: Secondary | ICD-10-CM | POA: Diagnosis not present

## 2016-12-20 DIAGNOSIS — Z170 Estrogen receptor positive status [ER+]: Principal | ICD-10-CM

## 2016-12-20 DIAGNOSIS — Z79899 Other long term (current) drug therapy: Secondary | ICD-10-CM | POA: Diagnosis not present

## 2016-12-20 DIAGNOSIS — C50512 Malignant neoplasm of lower-outer quadrant of left female breast: Secondary | ICD-10-CM

## 2016-12-20 HISTORY — DX: Pneumonia, unspecified organism: J18.9

## 2016-12-20 LAB — BASIC METABOLIC PANEL
Anion gap: 9 (ref 5–15)
BUN: 29 mg/dL — AB (ref 6–20)
CALCIUM: 9.9 mg/dL (ref 8.9–10.3)
CO2: 26 mmol/L (ref 22–32)
CREATININE: 1.54 mg/dL — AB (ref 0.44–1.00)
Chloride: 107 mmol/L (ref 101–111)
GFR calc Af Amer: 35 mL/min — ABNORMAL LOW (ref >60)
GFR calc non Af Amer: 30 mL/min — ABNORMAL LOW (ref >60)
Glucose, Bld: 122 mg/dL — ABNORMAL HIGH (ref 65–99)
Potassium: 4.7 mmol/L (ref 3.5–5.1)
SODIUM: 142 mmol/L (ref 135–145)

## 2016-12-20 LAB — GLUCOSE, CAPILLARY: GLUCOSE-CAPILLARY: 113 mg/dL — AB (ref 65–99)

## 2016-12-20 LAB — CBC
HEMATOCRIT: 39.4 % (ref 36.0–46.0)
HEMOGLOBIN: 12.6 g/dL (ref 12.0–15.0)
MCH: 30.7 pg (ref 26.0–34.0)
MCHC: 32 g/dL (ref 30.0–36.0)
MCV: 95.9 fL (ref 78.0–100.0)
Platelets: 200 10*3/uL (ref 150–400)
RBC: 4.11 MIL/uL (ref 3.87–5.11)
RDW: 13.2 % (ref 11.5–15.5)
WBC: 6.2 10*3/uL (ref 4.0–10.5)

## 2016-12-20 NOTE — Progress Notes (Signed)
Requested sleep study report from Dr. Jeneen Rinks Kim's office [phone].

## 2016-12-21 ENCOUNTER — Encounter (HOSPITAL_COMMUNITY): Payer: Self-pay | Admitting: Anesthesiology

## 2016-12-21 LAB — HEMOGLOBIN A1C
Hgb A1c MFr Bld: 5.5 % (ref 4.8–5.6)
MEAN PLASMA GLUCOSE: 111 mg/dL

## 2016-12-21 NOTE — Progress Notes (Signed)
  Called Dr Cornett's office to see if they have a clearance note from Neurology.  Saw Dr. Jannifer Franklin on 11-30-16. Office staff states that they do not have a clearance, just the note made by the NP on 11-30-16.

## 2016-12-21 NOTE — Anesthesia Preprocedure Evaluation (Addendum)
Anesthesia Evaluation  Patient identified by MRN, date of birth, ID band Patient awake    Reviewed: Allergy & Precautions, NPO status , Patient's Chart, lab work & pertinent test results  Airway Mallampati: III       Dental   Pulmonary    Pulmonary exam normal        Cardiovascular hypertension, Normal cardiovascular exam Rhythm:Regular Rate:Normal     Neuro/Psych    GI/Hepatic negative GI ROS, Neg liver ROS,   Endo/Other  diabetesHypothyroidism Morbid obesity  Renal/GU Renal InsufficiencyRenal disease     Musculoskeletal   Abdominal (+) + obese,   Peds  Hematology   Anesthesia Other Findings   Reproductive/Obstetrics                            Anesthesia Physical Anesthesia Plan  ASA: III  Anesthesia Plan: General   Post-op Pain Management: GA combined w/ Regional for post-op pain   Induction: Intravenous  PONV Risk Score and Plan: 3 and Ondansetron, Dexamethasone, Propofol, Midazolam and Treatment may vary due to age  Airway Management Planned: LMA  Additional Equipment:   Intra-op Plan:   Post-operative Plan:   Informed Consent: I have reviewed the patients History and Physical, chart, labs and discussed the procedure including the risks, benefits and alternatives for the proposed anesthesia with the patient or authorized representative who has indicated his/her understanding and acceptance.     Plan Discussed with: CRNA and Surgeon  Anesthesia Plan Comments:        Anesthesia Quick Evaluation

## 2016-12-22 ENCOUNTER — Ambulatory Visit (HOSPITAL_COMMUNITY): Payer: Medicare Other | Admitting: Anesthesiology

## 2016-12-22 ENCOUNTER — Encounter (HOSPITAL_COMMUNITY)
Admission: RE | Disposition: A | Discharge status: Home / Self Care | Payer: Self-pay | Source: Ambulatory Visit | Attending: Surgery

## 2016-12-22 ENCOUNTER — Observation Stay (HOSPITAL_COMMUNITY)
Admission: RE | Admit: 2016-12-22 | Discharge: 2016-12-23 | Disposition: A | Discharge status: Home / Self Care | Payer: Medicare Other | Source: Ambulatory Visit | Attending: Surgery | Admitting: Surgery

## 2016-12-22 ENCOUNTER — Encounter (HOSPITAL_COMMUNITY): Payer: Self-pay | Admitting: *Deleted

## 2016-12-22 DIAGNOSIS — Z79899 Other long term (current) drug therapy: Secondary | ICD-10-CM | POA: Insufficient documentation

## 2016-12-22 DIAGNOSIS — C50912 Malignant neoplasm of unspecified site of left female breast: Secondary | ICD-10-CM | POA: Diagnosis present

## 2016-12-22 DIAGNOSIS — Z170 Estrogen receptor positive status [ER+]: Secondary | ICD-10-CM

## 2016-12-22 DIAGNOSIS — I10 Essential (primary) hypertension: Secondary | ICD-10-CM | POA: Diagnosis not present

## 2016-12-22 DIAGNOSIS — Z6841 Body Mass Index (BMI) 40.0 and over, adult: Secondary | ICD-10-CM | POA: Diagnosis not present

## 2016-12-22 DIAGNOSIS — D0512 Intraductal carcinoma in situ of left breast: Secondary | ICD-10-CM | POA: Diagnosis not present

## 2016-12-22 DIAGNOSIS — Z880 Allergy status to penicillin: Secondary | ICD-10-CM | POA: Insufficient documentation

## 2016-12-22 DIAGNOSIS — C50512 Malignant neoplasm of lower-outer quadrant of left female breast: Secondary | ICD-10-CM

## 2016-12-22 HISTORY — PX: SIMPLE MASTECTOMY WITH AXILLARY SENTINEL NODE BIOPSY: SHX6098

## 2016-12-22 LAB — GLUCOSE, CAPILLARY: Glucose-Capillary: 110 mg/dL — ABNORMAL HIGH (ref 65–99)

## 2016-12-22 SURGERY — SIMPLE MASTECTOMY
Anesthesia: General | Site: Breast | Laterality: Left

## 2016-12-22 MED ORDER — CLINDAMYCIN PHOSPHATE 600 MG/50ML IV SOLN: 600.0000 mg | Freq: Once | INTRAVENOUS | Status: DC

## 2016-12-22 MED ORDER — PROPOFOL 10 MG/ML IV BOLUS
INTRAVENOUS | Status: DC | PRN
Start: 2016-12-22 — End: 2016-12-22
  Administered 2016-12-22: 120 mg via INTRAVENOUS

## 2016-12-22 MED ORDER — TRAMADOL HCL 50 MG PO TABS: 50.0000 mg | ORAL_TABLET | Freq: Two times a day (BID) | ORAL | Status: DC | PRN

## 2016-12-22 MED ORDER — ACETAMINOPHEN 650 MG RE SUPP: 650.0000 mg | Freq: Four times a day (QID) | RECTAL | Status: DC | PRN

## 2016-12-22 MED ORDER — LIDOCAINE 2% (20 MG/ML) 5 ML SYRINGE
INTRAMUSCULAR | Status: DC | PRN
  Administered 2016-12-22: 100 mg via INTRAVENOUS

## 2016-12-22 MED ORDER — HYDROCODONE-ACETAMINOPHEN 5-325 MG PO TABS
1.0000 | ORAL_TABLET | ORAL | Status: DC | PRN
  Administered 2016-12-23: 1 via ORAL
  Filled 2016-12-22: qty 1

## 2016-12-22 MED ORDER — HYDRALAZINE HCL 20 MG/ML IJ SOLN: 10.0000 mg | INTRAMUSCULAR | Status: DC | PRN

## 2016-12-22 MED ORDER — DONEPEZIL HCL 10 MG PO TABS
10.0000 mg | ORAL_TABLET | Freq: Every day | ORAL | Status: DC
  Administered 2016-12-22: 10 mg via ORAL
  Filled 2016-12-22: qty 1

## 2016-12-22 MED ORDER — ACETAMINOPHEN 500 MG PO TABS
1000.0000 mg | ORAL_TABLET | ORAL | Status: AC
  Administered 2016-12-22: 1000 mg via ORAL
  Filled 2016-12-22: qty 2

## 2016-12-22 MED ORDER — FENTANYL CITRATE (PF) 100 MCG/2ML IJ SOLN
INTRAMUSCULAR | Status: DC | PRN
  Administered 2016-12-22 (×4): 25 ug via INTRAVENOUS

## 2016-12-22 MED ORDER — CHLORHEXIDINE GLUCONATE CLOTH 2 % EX PADS: 6.0000 | MEDICATED_PAD | Freq: Once | CUTANEOUS | Status: DC

## 2016-12-22 MED ORDER — FENTANYL CITRATE (PF) 250 MCG/5ML IJ SOLN
INTRAMUSCULAR | Status: AC
  Filled 2016-12-22: qty 5

## 2016-12-22 MED ORDER — ACETAMINOPHEN 325 MG PO TABS: 650.0000 mg | ORAL_TABLET | Freq: Four times a day (QID) | ORAL | Status: DC | PRN

## 2016-12-22 MED ORDER — GABAPENTIN 300 MG PO CAPS
300.0000 mg | ORAL_CAPSULE | ORAL | Status: AC
  Administered 2016-12-22: 300 mg via ORAL
  Filled 2016-12-22: qty 1

## 2016-12-22 MED ORDER — DEXTROSE IN LACTATED RINGERS 5 % IV SOLN
INTRAVENOUS | Status: DC
  Administered 2016-12-22 – 2016-12-23 (×2): via INTRAVENOUS

## 2016-12-22 MED ORDER — VENLAFAXINE HCL ER 75 MG PO CP24
75.0000 mg | ORAL_CAPSULE | Freq: Every day | ORAL | Status: DC
  Administered 2016-12-23: 75 mg via ORAL
  Filled 2016-12-22: qty 1

## 2016-12-22 MED ORDER — LORAZEPAM 0.5 MG PO TABS
0.5000 mg | ORAL_TABLET | Freq: Every evening | ORAL | Status: DC | PRN
  Administered 2016-12-22: 0.5 mg via ORAL
  Filled 2016-12-22: qty 1

## 2016-12-22 MED ORDER — ONDANSETRON HCL 4 MG/2ML IJ SOLN: 4.0000 mg | Freq: Four times a day (QID) | INTRAMUSCULAR | Status: DC | PRN

## 2016-12-22 MED ORDER — LOSARTAN POTASSIUM 50 MG PO TABS
50.0000 mg | ORAL_TABLET | Freq: Every evening | ORAL | Status: DC
  Administered 2016-12-22: 50 mg via ORAL
  Filled 2016-12-22: qty 1

## 2016-12-22 MED ORDER — HEMOSTATIC AGENTS (NO CHARGE) OPTIME
TOPICAL | Status: DC | PRN
  Administered 2016-12-22: 2 via TOPICAL

## 2016-12-22 MED ORDER — ENOXAPARIN SODIUM 40 MG/0.4ML ~~LOC~~ SOLN
40.0000 mg | SUBCUTANEOUS | Status: DC
  Administered 2016-12-23: 40 mg via SUBCUTANEOUS
  Filled 2016-12-22: qty 0.4

## 2016-12-22 MED ORDER — PROPOFOL 10 MG/ML IV BOLUS
INTRAVENOUS | Status: AC
  Filled 2016-12-22: qty 20

## 2016-12-22 MED ORDER — MIDAZOLAM HCL 2 MG/2ML IJ SOLN
INTRAMUSCULAR | Status: AC
  Filled 2016-12-22: qty 2

## 2016-12-22 MED ORDER — LEVOTHYROXINE SODIUM 100 MCG PO TABS
100.0000 ug | ORAL_TABLET | Freq: Every day | ORAL | Status: DC
  Administered 2016-12-23: 100 ug via ORAL
  Filled 2016-12-22: qty 1

## 2016-12-22 MED ORDER — FENOFIBRATE 54 MG PO TABS
54.0000 mg | ORAL_TABLET | Freq: Every day | ORAL | Status: DC
  Administered 2016-12-23: 54 mg via ORAL
  Filled 2016-12-22 (×2): qty 1

## 2016-12-22 MED ORDER — DEXAMETHASONE SODIUM PHOSPHATE 10 MG/ML IJ SOLN
INTRAMUSCULAR | Status: DC | PRN
  Administered 2016-12-22: 4 mg via INTRAVENOUS

## 2016-12-22 MED ORDER — LACTATED RINGERS IV SOLN
INTRAVENOUS | Status: DC
  Administered 2016-12-22: 08:00:00 via INTRAVENOUS

## 2016-12-22 MED ORDER — METHOCARBAMOL 500 MG PO TABS: 500.0000 mg | ORAL_TABLET | Freq: Four times a day (QID) | ORAL | Status: DC | PRN

## 2016-12-22 MED ORDER — FENTANYL CITRATE (PF) 100 MCG/2ML IJ SOLN
INTRAMUSCULAR | Status: AC
  Administered 2016-12-22: 50 ug via INTRAVENOUS
  Filled 2016-12-22: qty 2

## 2016-12-22 MED ORDER — ACETAMINOPHEN 500 MG PO TABS: 1000.0000 mg | ORAL_TABLET | ORAL | Status: DC

## 2016-12-22 MED ORDER — 0.9 % SODIUM CHLORIDE (POUR BTL) OPTIME
TOPICAL | Status: DC | PRN
  Administered 2016-12-22: 1000 mL

## 2016-12-22 MED ORDER — GABAPENTIN 300 MG PO CAPS: 300.0000 mg | ORAL_CAPSULE | ORAL | Status: DC

## 2016-12-22 MED ORDER — SIMETHICONE 80 MG PO CHEW: 40.0000 mg | CHEWABLE_TABLET | Freq: Four times a day (QID) | ORAL | Status: DC | PRN

## 2016-12-22 MED ORDER — EPHEDRINE SULFATE-NACL 50-0.9 MG/10ML-% IV SOSY
PREFILLED_SYRINGE | INTRAVENOUS | Status: DC | PRN
  Administered 2016-12-22 (×2): 5 mg via INTRAVENOUS

## 2016-12-22 MED ORDER — CLINDAMYCIN PHOSPHATE 600 MG/50ML IV SOLN
600.0000 mg | Freq: Once | INTRAVENOUS | Status: AC
  Administered 2016-12-22: 600 mg via INTRAVENOUS
  Filled 2016-12-22: qty 50

## 2016-12-22 MED ORDER — ONDANSETRON 4 MG PO TBDP: 4.0000 mg | ORAL_TABLET | Freq: Four times a day (QID) | ORAL | Status: DC | PRN

## 2016-12-22 MED ORDER — ONDANSETRON HCL 4 MG/2ML IJ SOLN
INTRAMUSCULAR | Status: DC | PRN
Start: 2016-12-22 — End: 2016-12-22
  Administered 2016-12-22: 4 mg via INTRAVENOUS

## 2016-12-22 MED ORDER — FENTANYL CITRATE (PF) 100 MCG/2ML IJ SOLN
50.0000 ug | Freq: Once | INTRAMUSCULAR | Status: AC
  Administered 2016-12-22: 50 ug via INTRAVENOUS
  Filled 2016-12-22: qty 1

## 2016-12-22 MED ORDER — MORPHINE SULFATE (PF) 4 MG/ML IV SOLN: 2.0000 mg | INTRAVENOUS | Status: DC | PRN

## 2016-12-22 SURGICAL SUPPLY — 49 items
ADH SKN CLS APL DERMABOND .7 (GAUZE/BANDAGES/DRESSINGS) ×2
ADH SKN CLS LQ APL DERMABOND (GAUZE/BANDAGES/DRESSINGS) ×1
APPLIER CLIP 9.375 MED OPEN (MISCELLANEOUS) ×3
APR CLP MED 9.3 20 MLT OPN (MISCELLANEOUS) ×1
BINDER BREAST LRG (GAUZE/BANDAGES/DRESSINGS) IMPLANT
BINDER BREAST XLRG (GAUZE/BANDAGES/DRESSINGS) ×2 IMPLANT
CANISTER SUCT 3000ML PPV (MISCELLANEOUS) ×3 IMPLANT
CHLORAPREP W/TINT 26ML (MISCELLANEOUS) ×3 IMPLANT
CLIP APPLIE 9.375 MED OPEN (MISCELLANEOUS) ×1 IMPLANT
CLOSURE STERI-STRIP 1/4X4 (GAUZE/BANDAGES/DRESSINGS) ×3 IMPLANT
COVER SURGICAL LIGHT HANDLE (MISCELLANEOUS) ×5 IMPLANT
DERMABOND ADHESIVE PROPEN (GAUZE/BANDAGES/DRESSINGS) ×2
DERMABOND ADVANCED (GAUZE/BANDAGES/DRESSINGS) ×4
DERMABOND ADVANCED .7 DNX12 (GAUZE/BANDAGES/DRESSINGS) IMPLANT
DERMABOND ADVANCED .7 DNX6 (GAUZE/BANDAGES/DRESSINGS) ×1 IMPLANT
DEVICE DISSECT PLASMABLAD 3.0S (MISCELLANEOUS) ×1 IMPLANT
DRAIN CHANNEL 19F RND (DRAIN) ×3 IMPLANT
DRAPE LAPAROSCOPIC ABDOMINAL (DRAPES) ×3 IMPLANT
DRAPE UTILITY XL STRL (DRAPES) ×6 IMPLANT
DRSG PAD ABDOMINAL 8X10 ST (GAUZE/BANDAGES/DRESSINGS) ×2 IMPLANT
ELECT CAUTERY BLADE 6.4 (BLADE) ×3 IMPLANT
ELECT REM PT RETURN 9FT ADLT (ELECTROSURGICAL) ×3
ELECTRODE REM PT RTRN 9FT ADLT (ELECTROSURGICAL) ×1 IMPLANT
EVACUATOR SILICONE 100CC (DRAIN) ×3 IMPLANT
GAUZE SPONGE 4X4 12PLY STRL (GAUZE/BANDAGES/DRESSINGS) ×3 IMPLANT
GAUZE SPONGE 4X4 12PLY STRL LF (GAUZE/BANDAGES/DRESSINGS) ×2 IMPLANT
GLOVE BIO SURGEON STRL SZ8 (GLOVE) ×3 IMPLANT
GLOVE BIOGEL PI IND STRL 7.0 (GLOVE) ×2 IMPLANT
GLOVE BIOGEL PI IND STRL 8 (GLOVE) ×1 IMPLANT
GLOVE BIOGEL PI INDICATOR 7.0 (GLOVE) ×4
GLOVE BIOGEL PI INDICATOR 8 (GLOVE) ×2
GLOVE ECLIPSE 6.5 STRL STRAW (GLOVE) ×2 IMPLANT
GOWN STRL REUS W/ TWL LRG LVL3 (GOWN DISPOSABLE) ×2 IMPLANT
GOWN STRL REUS W/ TWL XL LVL3 (GOWN DISPOSABLE) ×1 IMPLANT
GOWN STRL REUS W/TWL LRG LVL3 (GOWN DISPOSABLE) ×6
GOWN STRL REUS W/TWL XL LVL3 (GOWN DISPOSABLE) ×3
HEMOSTAT ARISTA ABSORB 3G PWDR (MISCELLANEOUS) ×4 IMPLANT
KIT BASIN OR (CUSTOM PROCEDURE TRAY) ×3 IMPLANT
KIT ROOM TURNOVER OR (KITS) ×3 IMPLANT
NS IRRIG 1000ML POUR BTL (IV SOLUTION) ×3 IMPLANT
PACK GENERAL/GYN (CUSTOM PROCEDURE TRAY) ×3 IMPLANT
PAD ARMBOARD 7.5X6 YLW CONV (MISCELLANEOUS) ×3 IMPLANT
PLASMABLADE 3.0S (MISCELLANEOUS)
SPECIMEN JAR X LARGE (MISCELLANEOUS) ×3 IMPLANT
SUT ETHILON 3 0 FSL (SUTURE) ×3 IMPLANT
SUT MNCRL AB 4-0 PS2 18 (SUTURE) ×3 IMPLANT
SUT VIC AB 3-0 SH 18 (SUTURE) ×3 IMPLANT
TOWEL OR 17X24 6PK STRL BLUE (TOWEL DISPOSABLE) ×3 IMPLANT
TOWEL OR 17X26 10 PK STRL BLUE (TOWEL DISPOSABLE) ×3 IMPLANT

## 2016-12-22 NOTE — Transfer of Care (Signed)
Immediate Anesthesia Transfer of Care Note  Patient: Kiara Nelson  Procedure(s) Performed: Procedure(s) with comments: LEFT SIMPLE MASTECTOMY (Left) - ANESTHESIA GENERL AND PECTORAL BLOCK  Patient Location: PACU  Anesthesia Type:GA combined with regional for post-op pain  Level of Consciousness: awake and alert   Airway & Oxygen Therapy: Patient Spontanous Breathing and Patient connected to nasal cannula oxygen  Post-op Assessment: Report given to RN and Post -op Vital signs reviewed and stable  Post vital signs: Reviewed and stable  Last Vitals:  Vitals:   12/22/16 0840 12/22/16 0841  BP: (!) 150/70 (!) 150/70  Pulse: 60 (!) 45  Resp: 20 20  Temp:      Last Pain:  Vitals:   12/22/16 0841  TempSrc:   PainSc: 0-No pain         Complications: No apparent anesthesia complications

## 2016-12-22 NOTE — Interval H&P Note (Signed)
History and Physical Interval Note:  12/22/2016 8:31 AM  Kiara Nelson  has presented today for surgery, with the diagnosis of left breast cancer  The various methods of treatment have been discussed with the patient and family. After consideration of risks, benefits and other options for treatment, the patient has consented to  Procedure(s) with comments: LEFT SIMPLE MASTECTOMY (Left) - [place] as a surgical intervention .  The patient's history has been reviewed, patient examined, no change in status, stable for surgery.  I have reviewed the patient's chart and labs.  Questions were answered to the patient's satisfaction.     Darey Hershberger A.

## 2016-12-22 NOTE — Anesthesia Postprocedure Evaluation (Signed)
Anesthesia Post Note  Patient: Kiara Nelson  Procedure(s) Performed: Procedure(s) (LRB): LEFT SIMPLE MASTECTOMY (Left)     Anesthesia Post Evaluation  Last Vitals:  Vitals:   12/22/16 1105 12/22/16 1124  BP: 120/69   Pulse: 65 (!) 54  Resp: 20 16  Temp:      Last Pain:  Vitals:   12/22/16 1124  TempSrc:   PainSc: 0-No pain   Pain Goal:                 Kiara Nelson,Kiara Nelson

## 2016-12-22 NOTE — Anesthesia Postprocedure Evaluation (Signed)
Anesthesia Post Note  Patient: Kiara Nelson  Procedure(s) Performed: Procedure(s) (LRB): LEFT SIMPLE MASTECTOMY (Left)     Patient location during evaluation: PACU Anesthesia Type: General Level of consciousness: awake Pain management: pain level controlled Vital Signs Assessment: post-procedure vital signs reviewed and stable Respiratory status: spontaneous breathing Cardiovascular status: stable Postop Assessment: no signs of nausea or vomiting Anesthetic complications: no    Last Vitals:  Vitals:   12/22/16 1105 12/22/16 1124  BP: 120/69   Pulse: 65 (!) 54  Resp: 20 16  Temp:      Last Pain:  Vitals:   12/22/16 1124  TempSrc:   PainSc: 0-No pain   Pain Goal:                 Josiah Wojtaszek JR,JOHN Aliceson Dolbow

## 2016-12-22 NOTE — Care Management Note (Signed)
Case Management Note  Patient Details  Name: Kiara Nelson MRN: [id number] Date of Birth: Mar 22, 1934  Subjective/Objective:                    Action/Plan:  Provided list of home health agencies. Patient would like some time to make decision on which agency. Will continue to follow. Expected Discharge Date:                  Expected Discharge Plan:  [place]  In-House Referral:     Discharge planning Services  CM Consult  Post Acute Care Choice:  Home Health Choice offered to:  Patient  DME Arranged:    DME Agency:     HH Arranged:    [place] Agency:     Status of Service:  In process, will continue to follow  If discussed at Long Length of Stay Meetings, dates discussed:    Additional Comments:  Marilu Favre, RN 12/22/2016, 12:01 PM

## 2016-12-22 NOTE — Care Management Obs Status (Signed)
[place] NOTIFICATION   Patient Details  Name: Kiara Nelson MRN: [id number] Date of Birth: January 15, 1934   Medicare Observation Status Notification Given:  Yes   Explained to patient . Patient did not wish to sign at present. Marilu Favre, RN 12/22/2016, 11:59 AM

## 2016-12-22 NOTE — Anesthesia Procedure Notes (Signed)
Anesthesia Regional Block: Pectoralis block   Pre-Anesthetic Checklist: ,, timeout performed, Correct Patient, Correct Site, Correct Laterality, Correct Procedure, Correct Position, site marked, Risks and benefits discussed,  Surgical consent,  Pre-op evaluation,  At surgeon's request and post-op pain management  Laterality: Left and Upper  Prep: chloraprep       Needles:  Injection technique: Single-shot  Needle Type: Echogenic Stimulator Needle     Needle Length: 10cm  Needle Gauge: 21   Needle insertion depth: 3 cm   Additional Needles:   Procedures: ultrasound guided,,,,,,,,  Narrative:  Start time: 12/22/2016 8:15 AM End time: 12/22/2016 8:35 AM Injection made incrementally with aspirations every 5 mL. Anesthesiologist: Lyn Hollingshead

## 2016-12-22 NOTE — Op Note (Signed)
Modified Radical Mastectomy Procedure Note LEFT   Indications: This patient presents for surgical treatment of Breast Cancer. Pt has a history of left breast cancer in the past treated with breast conservation and radiation therapy 5 years ago.  The has recurred and she tried medical management alone but the tumor increaseD in size.  She has opted for left completion mastectomy .The surgical and non surgical options have been discussed with the patient.  Risks of surgery include bleeding,  Infection,  Flap necrosis,  Tissue loss,  Chronic pain, death, Numbness,  And the need for additional procedures.  Reconstruction options also have been discussed with the patient as well.  The patient agrees to proceed. Pre-operative Diagnosis: left breast cancer  Post-operative Diagnosis: left breast cancer  Surgeon: Erroll Luna A.   Assistants: RNFA  Anesthesia: General LMA anesthesia and Pec Block   ASA Class: 3  Procedure Details  The patient was seen again in the Holding Room. The risks, benefits, indications, potential complications, treatment options, and expected outcomes were discussed with the patient. The possibilities of reaction to medication, pulmonary aspiration, bleeding, recurrent infection, the need for additional procedures, failure to diagnose a condition, and creating a complication requiring transfusion or further operation were discussed with the patient. The patient and/or family concurred with the proposed plan, giving informed consent. The site of surgery was properly noted/marked. The patient was taken to the Operating Room, identified as [name] and the procedure verified as left modified radical mastectomy. A Time Out was held and the above information confirmed.  The patient was placed supine and general anesthetic was administered. The left arm, left breast, and chest were prepped and draped in standard fashion. An oblique elliptical incision was made encompassing the  nipple. Skin flaps were created meticulously to preserve the subdermal blood supply and maintain a clear margin of resection around the tumor. Dissection was carried down to the pectoralis fascia, which was included with the specimen, and an axillary dissection was performed in continuity with the mastectomy. This included levels I.She had a previous sentinel lymph node mapping procedure done  This was accomplished by exposing the axillary vein anteriorly and inferiorly to the level of the pectoralis minor and laterally. Small venous tributaries, lymphatics, and vessels were clipped and ligated or cauterized and divided. The subscapularis muscle was skeletonized. The long thoracic and thoracodorsal neurovascular bundles were identified and preserved.    The specimen was submitted to pathology. The wound was irrigated. Hemostasis achieved with cautery and [place].  One  J-P drains were placed.  The skin incision was closed in layers with a 3-0 Vicryl and 4 O monocryl subcuticular closure. The drain was  secured with 2-0 nylon  suture. Dermabond  applied along with a dry bulky gauze dressing.  Instrument, sponge, and needle counts were correct at closure and at the conclusion of the case.   Findings: see above   Estimated Blood Loss: less than 100 mL         Drains: one JP drain placed as above         Total IV Fluids: per anesthesia record          Specimens: Breast with axillary contents   Complications: None; patient tolerated the procedure well.         Disposition: PACU - hemodynamically stable.         Condition: stable

## 2016-12-22 NOTE — Anesthesia Procedure Notes (Signed)
Anesthesia Regional Block: Narrative:       

## 2016-12-22 NOTE — H&P (View-Only) (Signed)
Kiara Nelson 12/09/2016 10:00 AM Location: [facility] Patient #: [id number] DOB: [birth year] Married / Language: English / Race: White Female  History of Present Illness Marcello Moores A. Aisha Greenberger MD; 12/09/2016 11:01 AM) Patient words: Patient returns for follow-up of her records left breast cancer. She was seen in March a left mastectomy was recommended. The patient and family were concerned about her Alzheimer's and opted for anti-estrogens. Unfortunately, she has not had reduction in size of left breast cancer. They have opted now for left simple mastectomy. She's been seen by neurology which is cleared her for surgery. Her family has concerns about her worsening dementia after surgery. We had a long discussion about using blocks reducing the amount of medication that will hopefully reduce her risk of exacerbating her underlying Alzheimer's disease. She is otherwise in good spirits.  The patient is a 83 year old female.   Allergies Malachy Moan, RMA; 12/09/2016 10:01 AM) Penicillin G Benzathine & Proc *PENICILLINS* Swelling. Allergies Reconciled  Medication History Malachy Moan, RMA; 12/09/2016 10:02 AM) Donepezil HCl (10MG  Tablet, Oral daily) Active. Topiramate (25MG  Tablet, Oral daily) Active. Losartan Potassium (50MG  Tablet, Oral daily) Active. Levothyroxine Sodium (100MCG Tablet, Oral daily) Active. Fenofibrate (48MG  Tablet, Oral daily) Active. Levothyroxine Sodium (112MCG Tablet, Oral daily) Active. Desvenlafaxine Succinate ER (100MG  Tablet ER 24HR, Oral daily) Active. Omega 3 (1000MG  Capsule, Oral daily) Active. Multi-Minerals (Oral daily) Active. B6 Natural (100MG  Tablet, Oral daily) Active. D3-1000 (1000UNIT Capsule, Oral daily) Active. LORazepam (1MG  Tablet, Oral) Active. Medications Reconciled    Vitals Malachy Moan RMA; 12/09/2016 10:02 AM) 12/09/2016 10:02 AM Weight: 233.4 lb Height: 64in Body Surface Area: 2.09 m Body  Mass Index: 40.06 kg/m  Temp.: 98.44F  Pulse: 102 (Regular)  BP: 130/80 (Sitting, Left Arm, Standard)      Physical Exam (Zeb Rawl A. Little Bashore MD; 12/09/2016 11:02 AM)  General Mental Status-Alert. General Appearance-Consistent with stated age. Hydration-Well hydrated. Voice-Normal.  Head and Neck Head-normocephalic, atraumatic with no lesions or palpable masses. Trachea-midline. Thyroid Gland Characteristics - normal size and consistency.  Chest and Lung Exam Chest and lung exam reveals -quiet, even and easy respiratory effort with no use of accessory muscles and on auscultation, normal breath sounds, no adventitious sounds and normal vocal resonance. Inspection Chest Wall - Normal. Back - normal.  Breast Note: Left breast shows a indeterminate mass size in the central upper breast. This feels to be about 2-3 cm. Left axilla normal. Tattoos from left radiation noted. Right breast is normal.  Neurologic Note: Appropriate. Has very difficult time with short-term memory. Family with her today.  Musculoskeletal Normal Exam - Left-Upper Extremity Strength Normal and Lower Extremity Strength Normal. Normal Exam - Right-Upper Extremity Strength Normal and Lower Extremity Strength Normal.  Lymphatic Head & Neck  General Head & Neck Lymphatics: Bilateral - Description - Normal. Axillary  General Axillary Region: Bilateral - Description - Normal. Tenderness - Non Tender.    Assessment & Plan (Nicosha Struve A. Lulia Schriner MD; 12/09/2016 11:02 AM)  BREAST CANCER, LEFT (C50.912) Impression: The patient's only option is left simple mastectomy. Discussed the role of lymph node removal as well with her. Risks, benefits and old chart is discussed. This is a low-grade tumor with a large area of DCIS as well. Cc had previous radiation therapy lumpectomy unlikely in this circumstance especially given the large area and reduce breast size and the left. Discussed treatment  options for breast cancer to include breast conservation vs mastectomy with reconstruction. Pt has decided on mastectomy. Risk include bleeding,  infection, flap necrosis, pain, numbness, recurrence, hematoma, other surgery needs. Pt understands and agrees to proceed.  Current Plans Pt Education - [facility] Mastectomy [facility]CI You are being scheduled for surgery- Our schedulers will call you.  You should hear from our office's scheduling department within 5 working days about the location, date, and time of surgery. We try to make accommodations for patient's preferences in scheduling surgery, but sometimes the OR schedule or the surgeon's schedule prevents [place] from making those accommodations.  If you have not heard from our office [phone]) in 5 working days, call the office and ask for your surgeon's nurse.  If you have other questions about your diagnosis, plan, or surgery, call the office and ask for your surgeon's nurse.  We discussed the staging and pathophysiology of breast cancer. We discussed all of the different options for treatment for breast cancer including surgery, chemotherapy, radiation therapy, Herceptin, and antiestrogen therapy. We discussed a sentinel lymph node biopsy as she does not appear to having lymph node involvement right now. We discussed the performance of that with injection of radioactive tracer and blue dye. We discussed that she would have an incision underneath her axillary hairline. We discussed that there is a bout a 10-20% chance of having a positive node with a sentinel lymph node biopsy and we will await the permanent pathology to make any other first further decisions in terms of her treatment. One of these options might be to return to the operating room to perform an axillary lymph node dissection. We discussed about a 1-2% risk lifetime of chronic shoulder pain as well as lymphedema associated with a sentinel lymph node biopsy. We discussed the options for  treatment of the breast cancer which included lumpectomy versus a mastectomy. We discussed the performance of the lumpectomy with a wire placement. We discussed a 10-20% chance of a positive margin requiring reexcision in the operating room. We also discussed that she may need radiation therapy or antiestrogen therapy or both if she undergoes lumpectomy. We discussed the mastectomy and the postoperative care for that as well. We discussed that there is no difference in her survival whether she undergoes lumpectomy with radiation therapy or antiestrogen therapy versus a mastectomy. There is a slight difference in the local recurrence rate being 3-5% with lumpectomy and about 1% with a mastectomy. We discussed the risks of operation including bleeding, infection, possible reoperation. She understands her further therapy will be based on what her stages at the time of her operation.  Pt Education - [facility] Mastectomy HCI Pt Education - ABC (After Breast Cancer) Class Info: discussed with patient and provided information.

## 2016-12-23 ENCOUNTER — Encounter (HOSPITAL_COMMUNITY): Payer: Self-pay | Admitting: Surgery

## 2016-12-23 DIAGNOSIS — D0512 Intraductal carcinoma in situ of left breast: Secondary | ICD-10-CM | POA: Diagnosis not present

## 2016-12-23 MED ORDER — ACETAMINOPHEN 325 MG PO TABS: 650.0000 mg | ORAL_TABLET | Freq: Four times a day (QID) | ORAL | 0 refills | Status: AC | PRN

## 2016-12-23 MED ORDER — HYDROCODONE-ACETAMINOPHEN 5-325 MG PO TABS: 1.0000 | ORAL_TABLET | ORAL | 0 refills | Status: DC | PRN

## 2016-12-23 NOTE — Care Management Note (Signed)
Case Management Note  Patient Details  Name: DAVINE SWENEY MRN: [id number] Date of Birth: 1933-12-07  Subjective/Objective:   From home with spouse, who is there 24 hrs, and her daughter and son checks on her every day.  Family states they chose [facility] for [facility] yesterday,  NCM confirmed with Santiago Glad with [facility], family states they would like to speak with Santiago Glad , NCM informed her of this information and also explained to them how Home Care services work , that they would come out 2 to 3 times a week for about 30 minutes for about 2 weeks.  They states they understand. Patient is for dc today.                   Action/Plan: Patient for dc today with HHRN with [facility].  Expected Discharge Date:  12/23/16               Expected Discharge Plan:  [place]  In-House Referral:     Discharge planning Services  CM Consult  Post Acute Care Choice:  Home Health Choice offered to:  Patient  DME Arranged:    DME Agency:     HH Arranged:  RN [place] Agency:  [place]  Status of Service:  Completed, signed off  If discussed at [place] of Stay Meetings, dates discussed:    Additional Comments:  Zenon Mayo, RN 12/23/2016, 8:53 AM

## 2016-12-23 NOTE — Discharge Instructions (Signed)
[facility] [place] °[phone] ° °MASTECTOMY: POST OP INSTRUCTIONS ° °Always review your discharge instruction sheet given to you by the facility where your surgery was performed. °IF YOU HAVE DISABILITY OR FAMILY LEAVE FORMS, YOU MUST BRING THEM TO THE OFFICE FOR PROCESSING.   °DO NOT GIVE THEM TO YOUR DOCTOR. °A prescription for pain medication may be given to you upon discharge.  Take your pain medication as prescribed, if needed.  If narcotic pain medicine is not needed, then you may take acetaminophen (Tylenol) or ibuprofen (Advil) as needed. °1. Take your usually prescribed medications unless otherwise directed. °2. If you need a refill on your pain medication, please contact your pharmacy.  They will contact our office to request authorization.  Prescriptions will not be filled after 5pm or on week-ends. °3. You should follow a light diet the first few days after arrival home, such as soup and crackers, etc.  Resume your normal diet the day after surgery. °4. Most patients will experience some swelling and bruising on the chest and underarm.  Ice packs will help.  Swelling and bruising can take several days to resolve.  °5. It is common to experience some constipation if taking pain medication after surgery.  Increasing fluid intake and taking a stool softener (such as Colace) will usually help or prevent this problem from occurring.  A mild laxative (Milk of Magnesia or Miralax) should be taken according to package instructions if there are no bowel movements after 48 hours. °6. Unless discharge instructions indicate otherwise, leave your bandage dry and in place until your next appointment in 3-5 days.  You may take a limited sponge bath.  No tube baths or showers until the drains are removed.  You may have steri-strips (small skin tapes) in place directly over the incision.  These strips should be left on the skin for 7-10 days.  If your surgeon used skin glue on the incision, you may  shower in 24 hours.  The glue will flake off over the next 2-3 weeks.  Any sutures or staples will be removed at the office during your follow-up visit. °7. DRAINS:  If you have drains in place, it is important to keep a list of the amount of drainage produced each day in your drains.  Before leaving the hospital, you should be instructed on drain care.  Call our office if you have any questions about your drains. °8. ACTIVITIES:  You may resume regular (light) daily activities beginning the next day--such as daily self-care, walking, climbing stairs--gradually increasing activities as tolerated.  You may have sexual intercourse when it is comfortable.  Refrain from any heavy lifting or straining until approved by your doctor. °a. You may drive when you are no longer taking prescription pain medication, you can comfortably wear a seatbelt, and you can safely maneuver your car and apply brakes. °b. RETURN TO WORK:  __________________________________________________________ °9. You should see your doctor in the office for a follow-up appointment approximately 3-5 days after your surgery.  Your doctor’s nurse will typically make your follow-up appointment when she calls you with your pathology report.  Expect your pathology report 2-3 business days after your surgery.  You may call to check if you do not hear from us after three days.   °10. OTHER INSTRUCTIONS: ______________________________________________________________________________________________ ____________________________________________________________________________________________ °WHEN TO CALL YOUR DOCTOR: °1. Fever over 101.0 °2. Nausea and/or vomiting °3. Extreme swelling or bruising °4. Continued bleeding from incision. °5. Increased pain, redness, or drainage from the incision. °  The clinic staff is available to answer your questions during regular business hours.  Please dont hesitate to call and ask to speak to one of the nurses for clinical  concerns.  If you have a medical emergency, go to the nearest emergency room or call 911.  A surgeon from [facility] is always on call at the hospital. [address] [place], [place], [place]  [address] ? [address], [place], [place]   [address] [phone] ? [phone] ? FAX [phone] Web site: [place] [facility] drains are used to remove extra fluid that normally builds up in a surgical wound after surgery. A surgical drain helps to heal a surgical wound. Different kinds of surgical drains include:  Active drains. These drains use suction to pull drainage away from the surgical wound. Drainage flows through a tube to a container outside of the body. It is important to keep the bulb or the drainage container flat (compressed) at all times, except while you empty it. Flattening the bulb or container creates suction. The two most common types of active drains are bulb drains and Hemovac drains.  Passive drains. These drains allow fluid to drain naturally, by gravity. Drainage flows through a tube to a bandage (dressing) or a container outside of the body. Passive drains do not need to be emptied. The most common type of passive drain is the Penrose drain.  A drain is placed during surgery. Immediately after surgery, drainage is usually bright red and a little thicker than water. The drainage may gradually turn yellow or pink and become thinner. It is likely that your health care provider will remove the drain when the drainage stops or when the amount decreases to 1-2 Tbsp (15-30 mL) during a 24-hour period. How to care for your surgical drain  Keep the skin around the drain dry and covered with a dressing at all times.  Check your drain area every day for signs of infection. Check for: ? More redness, swelling, or pain. ? Pus or a bad smell. ? Cloudy drainage. Follow instructions from your health care provider about how to take care of your  drain and how to change your dressing. Change your dressing at least one time every day. Change it more often if needed to keep the dressing dry. Make sure you: 1. Gather your supplies, including: ? Tape. ? Germ-free cleaning solution (sterile saline). ? Split gauze drain sponge: 4 x 4 inches (10 x 10 cm). ? Gauze square: 4 x 4 inches (10 x 10 cm). 2. Wash your hands with soap and water before you change your dressing. If soap and water are not available, use hand sanitizer. 3. Remove the old dressing. Avoid using scissors to do that. 4. Use sterile saline to clean your skin around the drain. 5. Place the tube through the slit in a drain sponge. Place the drain sponge so that it covers your wound. 6. Place the gauze square or another drain sponge on top of the drain sponge that is on the wound. Make sure the tube is between those layers. 7. Tape the dressing to your skin. 8. If you have an active bulb or Hemovac drain, tape the drainage tube to your skin 1-2 inches (2.5-5 cm) below the place where the tube enters your body. Taping keeps the tube from pulling on any stitches (sutures) that you have. 9. Wash your hands with soap and water. 10. Write down the color of your drainage  and how often you change your dressing.  How to empty your active bulb or Hemovac drain 1. Make sure that you have a measuring cup that you can empty your drainage into. 2. Wash your hands with soap and water. If soap and water are not available, use hand sanitizer. 3. Gently move your fingers down the tube while squeezing very lightly. This is called stripping the tube. This clears any drainage, clots, or tissue from the tube. ? Do not pull on the tube. ? You may need to strip the tube several times every day to keep the tube clear. 4. Open the bulb cap or the drain plug. Do not touch the inside of the cap or the bottom of the plug. 5. Empty all of the drainage into the measuring cup. 6. Compress the bulb or the  container and replace the cap or the plug. To compress the bulb or the container, squeeze it firmly in the middle while you close the cap or plug the container. 7. Write down the amount of drainage that you have in each 24-hour period. If you have less than 2 Tbsp (30 mL) of drainage during 24 hours, contact your health care provider. 8. Flush the drainage down the toilet. 9. Wash your hands with soap and water. Contact a health care provider if:  You have more redness, swelling, or pain around your drain area.  The amount of drainage that you have is increasing instead of decreasing.  You have pus or a bad smell coming from your drain area.  You have a fever.  You have drainage that is cloudy.  There is a sudden stop or a sudden decrease in the amount of drainage that you have.  Your tube falls out.  Your active draindoes not stay compressedafter you empty it. This information is not intended to replace advice given to you by your health care provider. Make sure you discuss any questions you have with your health care provider. Document Released: 07/01/2000 Document Revised: 12/10/2015 Document Reviewed: 01/21/2015 Elsevier Interactive Patient Education  2018 [employer].

## 2016-12-23 NOTE — Progress Notes (Signed)
1 Day Post-Op   Subjective/Chief Complaint: Doing well no pain   Objective: Vital signs in last 24 hours: Temp:  [97.3 F (36.3 C)-98.3 F (36.8 C)] 98.1 F (36.7 C) (06/08 0617) Pulse Rate:  [45-81] 61 (06/08 0617) Resp:  [13-20] 18 (06/08 0617) BP: (111-150)/(60-70) 135/62 (06/08 0617) SpO2:  [96 %-100 %] 100 % (06/08 0617) Last BM Date: 12/22/16  Intake/Output from previous day: 06/07 0701 - 06/08 0700 In: 2553.8 [P.O.:360; I.V.:2193.8] Out: 260 [Drains:210; Blood:50] Intake/Output this shift: No intake/output data recorded.  Incision/Wound:Left chest CDI flaps viable no hematoma   Lab Results:   Recent Labs  12/20/16 1021  WBC 6.2  HGB 12.6  HCT 39.4  PLT 200   BMET  Recent Labs  12/20/16 1021  NA 142  K 4.7  CL 107  CO2 26  GLUCOSE 122*  BUN 29*  CREATININE 1.54*  CALCIUM 9.9   PT/INR No results for input(s): LABPROT, INR in the last 72 hours. ABG No results for input(s): PHART, HCO3 in the last 72 hours.  Invalid input(s): PCO2, PO2  Studies/Results: No results found.  Anti-infectives: Anti-infectives    Start     Dose/Rate Route Frequency Ordered Stop   12/22/16 0715  clindamycin (CLEOCIN) IVPB 600 mg     600 mg 100 mL/hr over 30 Minutes Intravenous  Once 12/22/16 0712 12/22/16 0935   12/22/16 0715  clindamycin (CLEOCIN) IVPB 600 mg  Status:  Discontinued     600 mg 100 mL/hr over 30 Minutes Intravenous  Once 12/22/16 0712 12/22/16 0713      Assessment/Plan: s/p Procedure(s) with comments: LEFT SIMPLE MASTECTOMY (Left) - [place] Discharge  LOS: 0 days    Rajvi Armentor A. 12/23/2016

## 2016-12-23 NOTE — Discharge Summary (Signed)
Physician Discharge Summary  Patient ID: Kiara Nelson MRN: [id number] DOB/AGE: July 20, 1933 83 y.o.  Admit date: 12/22/2016 Discharge date: 12/23/2016  Admission Diagnoses:left breast cancer  Discharge Diagnoses:  Active Problems:   Breast cancer, left [facility])   Discharged Condition: good  Hospital Course: PT did well.  Tolerated her diet and had very little pain.  JP output as expected  And flaps viable without hematoma.  Mental status was at baseline.   Consults: None  Significant Diagnostic Studies: none  Treatments: surgery: left mastectomy   Discharge Exam: Blood pressure 135/62, pulse 61, temperature 98.1 F (36.7 C), temperature source Oral, resp. rate 18, height 5' 3.5" (1.613 m), weight 105.2 kg (232 lb), last menstrual period 07/18/1969, SpO2 100 %. General appearance: alert and cooperative Resp: clear to auscultation bilaterally Cardio: regular rate and rhythm, S1, S2 normal, no murmur, click, rub or gallop Incision/Wound:CDI flaps left chest viable without hematoma   Disposition: 01-Home or Self Care  Discharge Instructions    Diet - low sodium heart healthy    Complete by:  As directed    Increase activity slowly    Complete by:  As directed      Allergies as of 12/23/2016      Reactions   Bee Venom Swelling   Penicillins Swelling   Swelling of hands Has patient had a PCN reaction causing immediate rash, facial/tongue/throat swelling, SOB or lightheadedness with hypotension: Yes Has patient had a PCN reaction causing severe rash involving mucus membranes or skin necrosis: No Has patient had a PCN reaction that required hospitalization: No Has patient had a PCN reaction occurring within the last 10 years: No If all of the above answers are "NO", then may proceed with Cephalosporin use.   Sulfa Antibiotics Other (See Comments)   unknown      Medication List    TAKE these medications   acetaminophen 325 MG tablet Commonly known as:  TYLENOL Take 2  tablets (650 mg total) by mouth every 6 (six) hours as needed for mild pain (or temp > 100).   B-12 2000 MCG Tabs Take 2,000 mcg by mouth 3 (three) times a week.   baclofen 10 MG tablet Commonly known as:  LIORESAL Take 10 mg by mouth daily as needed for muscle spasms.   Cinnamon 500 MG capsule Take 500 mg by mouth daily.   desvenlafaxine 100 MG 24 hr tablet Commonly known as:  PRISTIQ Take 1 tablet (100 mg total) by mouth daily. What changed:  when to take this   donepezil 10 MG tablet Commonly known as:  ARICEPT TAKE ONE TABLET BY MOUTH AT BEDTIME   fenofibrate 48 MG tablet Commonly known as:  TRICOR Take 48 mg by mouth every evening.   Fish Oil 1000 MG Caps Take 1 capsule by mouth 2 (two) times daily.   HYDROcodone-acetaminophen 5-325 MG tablet Commonly known as:  NORCO/VICODIN Take 1-2 tablets by mouth every 4 (four) hours as needed for moderate pain. What changed:  how much to take  when to take this   levothyroxine 100 MCG tablet Commonly known as:  SYNTHROID, LEVOTHROID Take 100 mcg by mouth daily before breakfast.   LORazepam 1 MG tablet Commonly known as:  ATIVAN Take 0.5 tablets by mouth at bedtime as needed and may repeat dose one time if needed for anxiety or sleep.   losartan 50 MG tablet Commonly known as:  COZAAR Take 50 mg by mouth every evening.   MENTHOL-ZINC OXIDE EX Apply 1 application  topically daily. Medicated body Powder   multivitamin with minerals tablet Take 1 tablet by mouth daily.   nystatin powder Commonly known as:  MYCOSTATIN/NYSTOP Apply topically daily. Start using after skin has healed   traMADol 50 MG tablet Commonly known as:  ULTRAM Take 50 mg by mouth 2 (two) times daily as needed for moderate pain.   vitamin A & D ointment Apply 1 application topically as needed for dry skin (rash).   vitamin B-1 250 MG tablet Take 250 mg by mouth daily.   Vitamin D 2000 units Caps Take 4,000 Units by mouth every evening.         Signed: Giomar Gusler A. 12/23/2016, 8:22 AM

## 2016-12-30 ENCOUNTER — Telehealth: Payer: Self-pay | Admitting: *Deleted

## 2016-12-30 ENCOUNTER — Telehealth: Payer: Self-pay

## 2016-12-30 NOTE — Telephone Encounter (Signed)
Patty called asking for another binder post surgery so the one can be washed. Also asking who the pt's navigator will be.   Called back and patty was asking what the navigator role is. Also that she called surgeon and they told her to call [facility] for the binder.   Call transferred to navigator.

## 2016-12-30 NOTE — Telephone Encounter (Signed)
Received call from patient's daughter Chong Sicilian regarding another binder that she could get for her mother so they can wash the one she is wearing.  I was able to get a binder from the OR.  Left binder at front desk for her to pick up.

## 2017-01-12 ENCOUNTER — Ambulatory Visit: Payer: Medicare Other | Admitting: Oncology

## 2017-01-17 ENCOUNTER — Other Ambulatory Visit: Payer: Self-pay | Admitting: Neurology

## 2017-01-24 ENCOUNTER — Ambulatory Visit (HOSPITAL_BASED_OUTPATIENT_CLINIC_OR_DEPARTMENT_OTHER): Payer: Medicare Other | Admitting: Oncology

## 2017-01-24 VITALS — BP 159/56 | HR 83 | Temp 98.6°F | Resp 18 | Ht 63.5 in | Wt 237.0 lb

## 2017-01-24 DIAGNOSIS — D0512 Intraductal carcinoma in situ of left breast: Secondary | ICD-10-CM

## 2017-01-24 DIAGNOSIS — C50812 Malignant neoplasm of overlapping sites of left female breast: Secondary | ICD-10-CM | POA: Diagnosis not present

## 2017-01-24 DIAGNOSIS — Z853 Personal history of malignant neoplasm of breast: Secondary | ICD-10-CM | POA: Diagnosis not present

## 2017-01-24 DIAGNOSIS — Z170 Estrogen receptor positive status [ER+]: Secondary | ICD-10-CM

## 2017-01-24 MED ORDER — EXEMESTANE 25 MG PO TABS: 25.0000 mg | ORAL_TABLET | Freq: Every day | ORAL | 4 refills | Status: DC

## 2017-01-24 NOTE — Progress Notes (Signed)
[place]  [phone] [phone]     ID: [name] DOB: October 03, 1933  MR#: [id number]  BZJ#:[id number]  Patient Care Team: Jani Gravel, MD as PCP - General (Internal Medicine) Erroll Luna, MD as Consulting Physician (General Surgery) Aaryanna Hyden, Virgie Dad, MD as Consulting Physician (Oncology) Kathrynn Ducking, MD as Consulting Physician (Neurology) Chauncey Cruel, MD OTHER MD:  CHIEF COMPLAINT: Recurrent breast cancer in previously irradiated breast  CURRENT TREATMENT: Exemestane   BREAST CANCER HISTORY: From the original intake note:  "Kiara Nelson" has a history of remote breast cancer, status post left lumpectomy in 2006 treated with adjuvant radiation then anti-estrogens briefly, discontinued by the patient because of sciatica concerns. I was her medical oncologist at that time. Marylene Buerger was her surgeon  More recently, on 09/13/2016 she had bilateral screening mammography with tomography at the [facility]. There were postoperative changes in the lower inner quadrant of the left breast. However in the upper central left breast was a possible mass and the patient was brought back on 09/21/2016 for unilateral left diagnostic mammography with tomography and left breast ultrasonography. The breast density was category C. At the 12:00 region of the left breast there was a 1.1 cm area of asymmetry associated with punctate calcifications. This was not palpable by exam. Ultrasound found no correlate. The left axilla was sonographically benign.  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 The invasive tumor was estrogen receptor positive at 100%, progesterone receptor positive at 20%, both with strong staining intensity, with an MIB-1  of 5%, and no HER-2 amplification, the signals ratio being 1.13 and the number per cell 1.75 area  The patient's subsequent history is as detailed below.  INTERVAL HISTORY: Kiara Nelson returns today for follow-up and treatment of her estrogen receptor positive breast cancer accompanied by her husband. Since her last visit here she underwent left mastectomy. The final pathology ([id number], on 12/22/2016) showed ductal carcinoma in situ, grade 1, measuring 1.6 cm. Margins were clear. All 3 sentinel lymph nodes were clear.  Her case was represented at conference 01/11/2017. Genetics referral was suggested. Continuing anti-estrogens was felt to be all that would be needed,  REVIEW OF SYSTEMS: Kiara Nelson i did generally well with her surgery and does not complain of pain. There has been no dehiscence or erythema. However she continues to drain between 1500 mL daily. There has been no obvious slacking up of the drainage, and this concerns her husband. She has minimal soreness, complains of occasional back pain, is sometimes anxious and depressed according to her husband although this is not apparent in today's visit. The patient has very poor immediate memory but is very pleasant and polite in her social interactions. A detailed review of systems today was otherwise stable  PAST MEDICAL HISTORY: Past Medical History:  Diagnosis Date  . Anxiety   . Arthritis   . Back pain, chronic    lumbar up to thoracic area  . Breast cancer ([place])    left breast 2006  . Cancer [facility])    Breast cancer  . Chronic low back pain 04/30/2014  . Complex endometrial hyperplasia 2004   renal insufficiency  . Dementia   . Depression 04/30/2014  . Diabetes mellitus without complication ([place])   . Headache(784.0) 10/29/2013  . Hypercholesteremia   .  Hypertension   . Obese   . OSA on CPAP   . Personal history of radiation therapy    left breast 2006  . Pneumonia    patient's family stated "10-12 years ago"  . Recurrent cystitis      PAST SURGICAL HISTORY: Past Surgical History:  Procedure Laterality Date  . ABDOMINAL HYSTERECTOMY  2014  . BREAST BIOPSY Left    2018  . BREAST LUMPECTOMY Left    malignant 2006  . BREAST SURGERY    . CATARACT EXTRACTION, BILATERAL  09/2012  . CYSTOSCOPY  07/03/2012   Procedure: CYSTOSCOPY;  Surgeon: Reece Packer, MD;  Location: [facility];  Service: Urology;;  . [place] OF UTERUS  2004   polyp resection  . EXAMINATION UNDER ANESTHESIA  07/03/2012   Procedure: EXAM UNDER ANESTHESIA;  Surgeon: Reece Packer, MD;  Location: [facility];  Service: Urology;  Laterality: N/A;  . FOOT NEUROMA SURGERY    . SIMPLE MASTECTOMY Left 12/22/2016  . SIMPLE MASTECTOMY WITH AXILLARY SENTINEL NODE BIOPSY Left 12/22/2016   Procedure: LEFT SIMPLE MASTECTOMY;  Surgeon: Erroll Luna, MD;  Location: [place];  Service: General;  Laterality: Left;  ANESTHESIA GENERL AND PECTORAL BLOCK  . TONSILLECTOMY  1962  . TUBAL LIGATION  1970  . VAGINAL HYSTERECTOMY  07/03/2012   Procedure: HYSTERECTOMY VAGINAL;  Surgeon: Peri Maris, MD;  Location: [facility];  Service: Gynecology;  Laterality: N/A;    FAMILY HISTORY Family History  Problem Relation Age of Onset  . Hypertension Mother   . Heart disease Mother   . Hypertension Father   . Dementia Father   . Depression Sister   The patient's parents died from causes unrelated to cancer. The patient has one sister, no brothers. A maternal grandmother and a maternal aunt were diagnosed with breast cancers in their 1s. There is no other history of breast cancer in the family and no ovarian cancer history to the family's knowledge   GYNECOLOGIC HISTORY:  Patient's last menstrual period was 07/18/1969 (approximate).  menarche age 9, first live birth age 8. The patient is GX P3. She never took hormone replacement. 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 Their oldest daughter lives in [place] and works as a Herbalist. Son Harrie Jeans lives in [place] and works for [employer]. Daughter Levander Campion lives in [place] and works in Engineer, technical sales. The patient has 4 grandsons and one daughter, granddaughter graduating from [employer] in nursing school this year. The patient attend a local [place]: Not in place   HEALTH MAINTENANCE: Social History  Substance Use Topics  . Smoking status: Never Smoker  . Smokeless tobacco: Never Used  . Alcohol use 0.0 oz/week     Comment: rarely     Colonoscopy:  PAP:  Bone density:   Allergies  Allergen Reactions  . Bee Venom Swelling  . Penicillins Swelling    Swelling of hands Has patient had a PCN reaction causing immediate rash, facial/tongue/throat swelling, SOB or lightheadedness with hypotension: Yes Has patient had a PCN reaction causing severe rash involving mucus membranes or skin necrosis: No Has patient had a PCN reaction that required hospitalization: No Has patient had a PCN reaction occurring within the last 10 years: No If all of the above answers are "NO", then may proceed with Cephalosporin use.   Marland Kitchen  Sulfa Antibiotics Other (See Comments)    unknown    Current Outpatient Prescriptions  Medication Sig Dispense Refill  . acetaminophen (TYLENOL) 325 MG tablet Take 2 tablets (650 mg total) by mouth every 6 (six) hours as needed for mild pain (or temp > 100). 30 tablet 0  . baclofen (LIORESAL) 10 MG tablet Take 10 mg by mouth daily as needed for muscle spasms.     . Cholecalciferol (VITAMIN D) 2000 units CAPS Take 4,000 Units by mouth every evening.    . Cinnamon 500 MG capsule Take 500 mg by mouth daily.    . Cyanocobalamin (B-12) 2000 MCG TABS Take 2,000 mcg by mouth 3 (three) times a week.    . desvenlafaxine (PRISTIQ) 100 MG 24 hr tablet TAKE ONE TABLET BY MOUTH ONCE DAILY 90  tablet 3  . donepezil (ARICEPT) 10 MG tablet TAKE ONE TABLET BY MOUTH AT BEDTIME 90 tablet 3  . exemestane (AROMASIN) 25 MG tablet Take 1 tablet (25 mg total) by mouth daily after breakfast. 90 tablet 4  . fenofibrate (TRICOR) 48 MG tablet Take 48 mg by mouth every evening.     Marland Kitchen HYDROcodone-acetaminophen (NORCO/VICODIN) 5-325 MG tablet Take 1-2 tablets by mouth every 4 (four) hours as needed for moderate pain. 15 tablet 0  . levothyroxine (SYNTHROID, LEVOTHROID) 100 MCG tablet Take 100 mcg by mouth daily before breakfast.    . LORazepam (ATIVAN) 1 MG tablet Take 0.5 tablets by mouth at bedtime as needed and may repeat dose one time if needed for anxiety or sleep.     Marland Kitchen losartan (COZAAR) 50 MG tablet Take 50 mg by mouth every evening.     Marland Kitchen MENTHOL-ZINC OXIDE EX Apply 1 application topically daily. Medicated body Powder    . Multiple Vitamins-Minerals (MULTIVITAMIN WITH MINERALS) tablet Take 1 tablet by mouth daily.    Marland Kitchen nystatin (MYCOSTATIN) powder Apply topically daily. Start using after skin has healed (Patient not taking: Reported on 10/13/2016) 15 g 2  . Omega-3 Fatty Acids (FISH OIL) 1000 MG CAPS Take 1 capsule by mouth 2 (two) times daily.    . Thiamine HCl (VITAMIN B-1) 250 MG tablet Take 250 mg by mouth daily.    . traMADol (ULTRAM) 50 MG tablet Take 50 mg by mouth 2 (two) times daily as needed for moderate pain.     . Vitamins A & D (VITAMIN A & D) ointment Apply 1 application topically as needed for dry skin (rash).     No current facility-administered medications for this visit.     OBJECTIVE: Older white womanIn no acute distress  Vitals:   01/24/17 1541  BP: (!) 159/56  Pulse: 83  Resp: 18  Temp: 98.6 F (37 C)     Body mass index is 41.32 kg/m.    ECOG FS:1 - Symptomatic but completely ambulatory   Sclerae unicteric, pupils round and equal Oropharynx clear and moist No cervical or supraclavicular adenopathy Lungs no rales or rhonchi Heart regular rate and rhythm Abd  soft, nontender, positive bowel sounds MSK no focal spinal tenderness, no upper extremity lymphedema Neuro: nonfocal, well oriented, appropriate affect Breasts:  The right breast is unremarkable. The left breast is status post recent mastectomy, with a drain still in place. There is approximately 30 mL of serous sanguinous fluid in the bulb.   LAB RESULTS:  CMP     Component Value Date/Time   NA 142 12/20/2016 1021   K 4.7 12/20/2016 1021   CL 107 12/20/2016  1021   CO2 26 12/20/2016 1021   GLUCOSE 122 (H) 12/20/2016 1021   BUN 29 (H) 12/20/2016 1021   CREATININE 1.54 (H) 12/20/2016 1021   CALCIUM 9.9 12/20/2016 1021   PROT 6.9 07/03/2012 0617   ALBUMIN 3.9 07/03/2012 0617   AST 18 07/03/2012 0617   ALT 9 07/03/2012 0617   ALKPHOS 58 07/03/2012 0617   BILITOT 0.4 07/03/2012 0617   GFRNONAA 30 (L) 12/20/2016 1021   GFRAA 35 (L) 12/20/2016 1021    No results found for: TOTALPROTELP, ALBUMINELP, A1GS, A2GS, BETS, BETA2SER, GAMS, MSPIKE, SPEI  No results found for: Nils Pyle, [facility]: Lambert Rhonda W  Lab Results  Component Value Date   WBC 6.2 12/20/2016   NEUTROABS 3.9 05/05/2012   HGB 12.6 12/20/2016   HCT 39.4 12/20/2016   MCV 95.9 12/20/2016   PLT 200 12/20/2016      Chemistry      Component Value Date/Time   NA 142 12/20/2016 1021   K 4.7 12/20/2016 1021   CL 107 12/20/2016 1021   CO2 26 12/20/2016 1021   BUN 29 (H) 12/20/2016 1021   CREATININE 1.54 (H) 12/20/2016 1021      Component Value Date/Time   CALCIUM 9.9 12/20/2016 1021   ALKPHOS 58 07/03/2012 0617   AST 18 07/03/2012 0617   ALT 9 07/03/2012 0617   BILITOT 0.4 07/03/2012 0617       Lab Results  Component Value Date   LABCA2 12 05/08/2008    No components found for: [id number]  No results for input(s): INR in the last 168 hours.  Urinalysis    Component Value Date/Time   COLORURINE YELLOW 05/05/2012 [place] 05/05/2012 1658   LABSPEC 1.011 05/05/2012 1658    PHURINE 6.0 05/05/2012 1658   GLUCOSEU NEGATIVE 05/05/2012 1658   HGBUR NEGATIVE 05/05/2012 1658   BILIRUBINUR n 04/09/2015 1109   KETONESUR NEGATIVE 05/05/2012 1658   PROTEINUR n 04/09/2015 1109   PROTEINUR NEGATIVE 05/05/2012 1658   UROBILINOGEN negative 04/09/2015 1109   UROBILINOGEN 0.2 05/05/2012 1658   NITRITE trace 04/09/2015 1109   NITRITE NEGATIVE 05/05/2012 1658   LEUKOCYTESUR moderate (2+) (A) 04/09/2015 1109     STUDIES: No results found.  ELIGIBLE FOR AVAILABLE RESEARCH PROTOCOL: no  ASSESSMENT: 83 y.o. [place] woman  (1) status post left breast lower inner quadrant lumpectomy 2006, followed by radiation and brief course of anti-estrogens  (2) status post left breast overlapping sites biopsy 09/23/2016 for a clinical mT1b N0, grade 1 invasive ductal carcinoma, estrogen and progesterone receptor positive, HER-2 not amplified, with an MIB-15%  (3) Left simple mastectomy 12/22/2016 showed low-grade ductal carcinoma in situ measuring 1.6 cm with negative margins and all 3 sentinel lymph nodes negative.  (4) letrozole started 10/13/2016 Ultrasound of the left breast 11/24/2016 documented progression  (5) exemestane started 01/24/2017  PLAN: I spent approximately 30 minutes with Kiara Nelson and her husband with most of that time spent discussing her overall situation. The thing that is bothering them the most is a continuing drainage and her husband in particular is concerned when that will be resolved. They will be discussing this with their surgeon at the upcoming visit 2 days from now. I reassured them though that this was temporary and that there is no evidence of infection at present.  Because lives cancer groups through letrozole we considered simply stopping anti-estrogens, switching to tamoxifen, or switching to a different aromatase inhibitor. I am uncomfortable putting her on tamoxifen because of concerns regarding clotting.  I think going back to letrozole would not be  useful.  Instead we are going to try exemestane. I cautioned them regarding cost issues and if this appears to be very expensive they will call. Otherwise she tolerated letrozole well, and hopefully she will tolerate exemestane equally well.  The main purpose of exemestane of course is not to deal with a ductal carcinoma in situ, which is cured, but to prevent further breast cancers from developing in this woman who has already had 2 episodes.  We also discussed genetics. She does qualify and the family is interested in getting her tested. I have requested an appointment with genetics.  Otherwise she will return to see me in 6 months. They know to call for any problems that may develop before the next visit.    Chauncey Cruel, MD   01/24/2017 8:40 PM Medical Oncology and Hematology [facility] [address] [place], [place] [address] Tel. [phone]    Fax. [phone]

## 2017-02-01 ENCOUNTER — Ambulatory Visit (HOSPITAL_BASED_OUTPATIENT_CLINIC_OR_DEPARTMENT_OTHER): Payer: Medicare Other | Admitting: Genetics

## 2017-02-01 ENCOUNTER — Other Ambulatory Visit: Payer: Medicare Other

## 2017-02-01 ENCOUNTER — Encounter: Payer: Medicare Other | Admitting: Genetic Counselor

## 2017-02-01 ENCOUNTER — Encounter: Payer: Self-pay | Admitting: Genetic Counselor

## 2017-02-01 DIAGNOSIS — Z315 Encounter for genetic counseling: Secondary | ICD-10-CM

## 2017-02-01 DIAGNOSIS — Z803 Family history of malignant neoplasm of breast: Secondary | ICD-10-CM | POA: Diagnosis not present

## 2017-02-01 DIAGNOSIS — Z853 Personal history of malignant neoplasm of breast: Secondary | ICD-10-CM | POA: Diagnosis not present

## 2017-02-01 NOTE — Progress Notes (Signed)
REFERRING PROVIDER: Chauncey Cruel, MD [address] [place], [place] [address]  PRIMARY PROVIDER:  Jani Gravel, MD  PRIMARY REASON FOR VISIT:  1. Personal history of breast cancer   2. Family history of breast cancer      HISTORY OF PRESENT ILLNESS:   Kiara Nelson, a 83 y.o. female, was seen for a Kiara Nelson consultation at the request of Kiara Nelson due to a personal and family history of cancer.  Kiara Nelson presents to clinic today to discuss the possibility of a hereditary predisposition to cancer, genetic testing, and to further clarify her future cancer risks, as well as potential cancer risks for family members. She was accompanied to her appointment by her husband who provided much of the family history information. Further family history information was obtained through a phone call between Kiara Nelson' husband and Kiara Nelson' niece, Kiara Nelson. Kiara Nelson and her husband both state that their daughters and granddaughter (a recent nursing graduate and new employee at [employer]) are quite interested in Kiara Nelson pursuing genetic testing. Kiara Nelson gave verbal consent for [place] to discuss her genetic counseling and testing information with her children and grandchildren if contacted.  CANCER HISTORY: In 2006, at the age of 66, Kiara Nelson was diagnosed with cancer of the left breast. This was treated with lumpectomy, adjuvant radiation, and brief use of anti-estrogens. A left breast biopsy performed on 09/23/2016 revealed ER/PR+ HER2- invasive ductal carcinoma. This was treated with left mastectomy and she continues anti-estrogen treatment.   Past Medical History:  Diagnosis Date  . Anxiety   . Arthritis   . Back pain, chronic    lumbar up to thoracic area  . Breast cancer ([place])    left breast 2006  . Cancer [facility])    Breast cancer  . Chronic low back pain 04/30/2014  . Complex endometrial hyperplasia 2004   renal insufficiency  . Dementia   . Depression 04/30/2014  .  Diabetes mellitus without complication ([place])   . Headache(784.0) 10/29/2013  . Hypercholesteremia   . Hypertension   . Obese   . OSA on CPAP   . Personal history of radiation therapy    left breast 2006  . Pneumonia    patient's family stated "10-12 years ago"  . Recurrent cystitis     Past Surgical History:  Procedure Laterality Date  . ABDOMINAL HYSTERECTOMY  2014  . BREAST BIOPSY Left    2018  . BREAST LUMPECTOMY Left    malignant 2006  . BREAST SURGERY    . CATARACT EXTRACTION, BILATERAL  09/2012  . CYSTOSCOPY  07/03/2012   Procedure: CYSTOSCOPY;  Surgeon: Reece Packer, MD;  Location: [facility];  Service: Urology;;  . [place] OF UTERUS  2004   polyp resection  . EXAMINATION UNDER ANESTHESIA  07/03/2012   Procedure: EXAM UNDER ANESTHESIA;  Surgeon: Reece Packer, MD;  Location: [facility];  Service: Urology;  Laterality: N/A;  . FOOT NEUROMA SURGERY    . SIMPLE MASTECTOMY Left 12/22/2016  . SIMPLE MASTECTOMY WITH AXILLARY SENTINEL NODE BIOPSY Left 12/22/2016   Procedure: LEFT SIMPLE MASTECTOMY;  Surgeon: Erroll Luna, MD;  Location: [place];  Service: General;  Laterality: Left;  ANESTHESIA GENERL AND PECTORAL BLOCK  . TONSILLECTOMY  1962  . TUBAL LIGATION  1970  . VAGINAL HYSTERECTOMY  07/03/2012   Procedure: HYSTERECTOMY VAGINAL;  Surgeon: Peri Maris, MD;  Location: [facility];  Service: Gynecology;  Laterality: N/A;  Social History   Social History  . Marital status: Married    Spouse name: N/A  . Number of children: 3  . Years of education: college   Occupational History  . Retired Retired   Social History Main Topics  . Smoking status: Never Smoker  . Smokeless tobacco: Never Used  . Alcohol use 0.0 oz/week     Comment: rarely  . Drug use: No  . Sexual activity: Yes    Partners: Male    Birth control/ protection: Post-menopausal, Surgical     Comment: TV[facility]   Other Topics Concern  . Not on file   Social History Narrative    Patient lives at home with her husband Kiara Nelson).   Education college.   Retired. School Pharmacist, hospital.   Right handed.   Caffeine -None.     FAMILY HISTORY:  We obtained a detailed, 4-generation family history.  Significant diagnoses are listed below: Family History  Problem Relation Age of Onset  . Hypertension Mother   . Heart disease Mother   . Hypertension Father   . Dementia Father   . Depression Sister   . Breast cancer Sister        d.85  . Breast cancer Maternal Aunt 60       d.70s  . Breast cancer Maternal Grandmother 60       d.33s   Kiara Nelson has two daughters, ages 36 and 91, and a son, age 81. All are without cancers. Kiara Nelson had one sister, Kiara Nelson, who recently died in 10/19/2022 at age 52. The family reports that Kiara Nelson had a history of breast cancer, though her age at diagnosis is unclear.   Kiara Nelson' mother died at 2 without cancers. Kiara Nelson has a maternal aunt, Kiara Nelson, who is 50 without cancers. Another maternal aunt, Kiara Nelson 2022-12-19, died in her 74s after having breast cancer in her 8s. Two of Kiara Nelson' maternal uncles died in their mid-70s of heart attacks. The third maternal uncle died at 62. None of her uncles had cancers. There are no known cancers in maternal first cousins. Kiara Nelson' maternal grandfather died in his 60s without cancers. Her maternal grandmother died in her 60s and had a history of breast cancer in her 60s.  Kiara Nelson' father died at 38 without cancers. Kiara Nelson' only paternal aunt died at age 40 from apparent pneumonia. Kiara Nelson' father had no other siblings. Both of Kiara Nelson' paternal grandparents died in their mid-40s without cancers.  Kiara Nelson is unaware of previous family history of genetic testing for hereditary cancer risks. Patient's maternal and paternal ancestors are of [place] descent. There is no reported Ashkenazi Jewish ancestry. There is no known consanguinity.  GENETIC COUNSELING ASSESSMENT: Kiara Nelson is a 83 y.o.  female with a personal and family history which is somewhat suggestive of a hereditary breast cancer syndrome and predisposition to cancer. We, therefore, discussed and recommended the following at today's visit.   DISCUSSION: We reviewed the characteristics, features and inheritance patterns of hereditary cancer syndromes. We also discussed genetic testing, including the appropriate family members to test, the process of testing, insurance coverage and turn-around-time for results. We discussed the implications of a negative, positive and/or variant of uncertain significant result. We recommended Ms. Ebrahim pursue genetic testing for the Common Hereditary Cancers Panel offered by [facility]. [facility]'s Common Hereditary Cancers Panel includes analysis of the following 46 genes: APC, ATM, AXIN2, BARD1, BMPR1A, BRCA1, BRCA2, BRIP1, CDH1, CDKN2A, CHEK2, CTNNA1, DICER1, EPCAM,  GREM1, HOXB13, KIT, MEN1, MLH1, MSH2, MSH3, MSH6, MUTYH, NBN, NF1, NTHL1, PALB2, PDGFRA, PMS2, POLD1, POLE, PTEN, RAD50, RAD51C, RAD51D, SDHA, SDHB, SDHC, SDHD, SMAD4, SMARCA4, STK11, TP53, TSC1, TSC2, and VHL.   Based on Ms. Othman' personal and family history of cancer, she meets medical criteria for genetic testing. Despite that she meets criteria, she may still have an out of pocket cost. We discussed that if her out of pocket cost for testing is over $100, the laboratory will call and confirm whether she wants to proceed with testing.  If the out of pocket cost of testing is less than $100 she will be billed by the genetic testing laboratory.   PLAN: After considering the risks, benefits, and limitations, Ms. Traub  provided informed consent to pursue genetic testing and the blood sample was sent to [facility] for analysis of the 46-gene Common Hereditary Cancers Panel. Results should be available within approximately 3 weeks' time, at which point they will be disclosed by telephone to Ms. Lenart, as will any additional  recommendations warranted by these results. This information will also be available in Epic.   Lastly, we encouraged Ms. Coury to remain in contact with cancer Nelson annually so that we can continuously update the family history and inform her of any changes in cancer Nelson and testing that may be of benefit for this family.   Ms.  Myszka questions were answered to her satisfaction today. Our contact information was provided should additional questions or concerns arise. Thank you for the referral and allowing [place] to share in the care of your patient.   Mal Misty, MS, [facility] Certified Naval [email or web address] phone: [phone]  The patient was seen for a total of 40 minutes in face-to-face genetic counseling.  This patient was discussed with Drs. Magrinat, Lindi Adie and/or Burr Medico who agrees with the above.    _______________________________________________________________________ For Office Staff:  Number of people involved in session: 2 Was an Intern/ student involved with case: no

## 2017-02-10 ENCOUNTER — Telehealth: Payer: Self-pay | Admitting: Genetics

## 2017-03-24 ENCOUNTER — Ambulatory Visit: Payer: Self-pay | Admitting: Genetics

## 2017-03-24 ENCOUNTER — Encounter: Payer: Self-pay | Admitting: Genetics

## 2017-03-24 DIAGNOSIS — Z1379 Encounter for other screening for genetic and chromosomal anomalies: Secondary | ICD-10-CM

## 2017-03-24 HISTORY — DX: Encounter for other screening for genetic and chromosomal anomalies: Z13.79

## 2017-03-24 NOTE — Telephone Encounter (Signed)
Reviewed that germline genetic testing revealed no pathogenic mutations. This is considered to be a negative result. Testing was performed through [facility]'s 46-gene Common Hereditary Cancers Panel. [facility]'s Common Hereditary Cancers Panel includes analysis of the following 46 genes: APC, ATM, AXIN2, BARD1, BMPR1A, BRCA1, BRCA2, BRIP1, CDH1, CDKN2A, CHEK2, CTNNA1, DICER1, EPCAM, GREM1, HOXB13, KIT, MEN1, MLH1, MSH2, MSH3, MSH6, MUTYH, NBN, NF1, NTHL1, PALB2, PDGFRA, PMS2, POLD1, POLE, PTEN, RAD50, RAD51C, RAD51D, SDHA, SDHB, SDHC, SDHD, SMAD4, SMARCA4, STK11, TP53, TSC1, TSC2, and VHL.  For more detailed discussion, please see genetic counseling documentation from 03/24/2017. Result report dated 02/09/2017.

## 2017-03-24 NOTE — Progress Notes (Signed)
HPI: Kiara Nelson was previously seen in the [facility] on 02/01/2017 due to a personal and family history of cancer and concerns regarding a hereditary predisposition to cancer. Please refer to our prior cancer genetics clinic note for more information regarding Kiara Nelson' medical, social and family histories, and our assessment and recommendations, at the time. Kiara Nelson' recent genetic test results were disclosed to her and her husband, as were recommendations warranted by these results. These results and recommendations are discussed in more detail below.  CANCER HISTORY: In 2006, at the age of 59, Kiara Nelson was diagnosed with cancer of the left breast. This was treated with lumpectomy, adjuvant radiation, and brief use of anti-estrogens. A left breast biopsy performed on 09/23/2016 revealed ER/PR+ HER2- invasive ductal carcinoma. This was treated with left mastectomy and she continues anti-estrogen treatment.    FAMILY HISTORY:  We obtained a detailed, 4-generation family history.  Significant diagnoses are listed below: Family History  Problem Relation Age of Onset  . Hypertension Mother   . Heart disease Mother   . Hypertension Father   . Dementia Father   . Depression Sister   . Breast cancer Sister        d.85  . Breast cancer Maternal Aunt 60       d.70s  . Breast cancer Maternal Grandmother 60       d.50s   Kiara Nelson has two daughters, ages 19 and 41, and a son, age 35. All are without cancers. Kiara Nelson had one sister, Kiara Nelson, who recently died in 20-Oct-2022 at age 21. The family reports that Kiara Nelson had a history of breast cancer, though her age at diagnosis is unclear.   Kiara Nelson' mother died at 48 without cancers. Kiara Nelson has a maternal aunt, Kiara Nelson, who is 73 without cancers. Another maternal aunt, Kiara Nelson 12/20/22, died in her 97s after having breast cancer in her 75s. Two of Mr. Rackers' maternal uncles died in their mid-70s of heart attacks. The third  maternal uncle died at 16. None of her uncles had cancers. There are no known cancers in maternal first cousins. Kiara Nelson' maternal grandfather died in his 64s without cancers. Her maternal grandmother died in her 38s and had a history of breast cancer in her 90s.  Kiara Nelson' father died at 62 without cancers. Kiara Nelson' only paternal aunt died at age 13 from apparent pneumonia. Kiara Nelson' father had no other siblings. Both of Kiara Nelson' paternal grandparents died in their mid-54s without cancers.  Kiara Nelson is unaware of previous family history of genetic testing for hereditary cancer risks. Patient's maternal and paternal ancestors are of [place] descent. There is no reported Ashkenazi Jewish ancestry. There is no known consanguinity.  GENETIC TEST RESULTS: Genetic testing performed through [facility]'s Common Hereditary Cancers Panel reported out on 02/09/2017 showed no pathogenic mutations. [facility] Common Hereditary Cancers Panel includes analysis of the following 46 genes: APC, ATM, AXIN2, BARD1, BMPR1A, BRCA1, BRCA2, BRIP1, CDH1, CDKN2A, CHEK2, CTNNA1, DICER1, EPCAM, GREM1, HOXB13, KIT, MEN1, MLH1, MSH2, MSH3, MSH6, MUTYH, NBN, NF1, NTHL1, PALB2, PDGFRA, PMS2, POLD1, POLE, PTEN, RAD50, RAD51C, RAD51D, SDHA, SDHB, SDHC, SDHD, SMAD4, SMARCA4, STK11, TP53, TSC1, TSC2, and VHL.  The test report will be scanned into EPIC and will be located under the Molecular Pathology section of the Results Review tab.A portion of the result report is included below for reference.    We discussed with [name] that since the current genetic testing is not  perfect, it is possible there may be a gene mutation in one of these genes that current testing cannot detect, but that chance is small. We also discussed, that it is possible that another gene that has not yet been discovered, or that we have not yet tested, is responsible for the cancer diagnoses in the family. Therefore, important to remain in touch with cancer  genetics in the future so that we can continue to offer [name] the most up to date genetic testing.   CANCER SCREENING RECOMMENDATIONS: This result indicates that it is unlikely [name] has an increased risk for a future cancer due to a mutation in one of these genes. Because no causative or actionable mutations were identified, it is recommended she continue to follow the cancer management and screening guidelines provided by her oncology and primary healthcare providers.   RECOMMENDATIONS FOR FAMILY MEMBERS: Women in this family might be at some increased risk of developing breast cancer, over the general population risk, simply due to the family history of cancer. We recommended women in this family have a yearly mammogram beginning at age 34, or 57 years younger than the earliest onset of cancer, an annual clinical breast exam, and perform monthly breast self-exams. Women in this family should also have a gynecological exam as recommended by their primary provider. All family members should have a colonoscopy by age 37.  Based on [name]' family history, it appears that her nieces and nephew are candidates for genetic testing, however, there is no genetic testing recommended for her children at this time unless indicated by paternal family history which was not assessed. [name] will let us know if we can be of any assistance in coordinating genetic counseling and/or testing for her family members.   FOLLOW-UP: Lastly, we discussed with [name] that cancer genetics is a rapidly advancing field and it is possible that new genetic tests will be appropriate for her and/or her family members in the future. We encouraged her to remain in contact with cancer genetics on an annual basis so we can update her personal and family histories and let her know of advances in cancer genetics that may benefit this family.   Per the request of [name] and her husband, a copy of her genetic testing  results and genetic counseling notes will be emailed to their address on file.  Our contact number was provided. Ms. Balthaser' questions were answered to her satisfaction, and she knows she is welcome to call us at anytime with additional questions or concerns.   Mal Misty, MS, [facility] Certified Naval [email or web address]

## 2017-03-28 ENCOUNTER — Other Ambulatory Visit: Payer: Self-pay | Admitting: Neurology

## 2017-04-26 ENCOUNTER — Other Ambulatory Visit: Payer: Self-pay | Admitting: Internal Medicine

## 2017-04-26 DIAGNOSIS — Z1231 Encounter for screening mammogram for malignant neoplasm of breast: Secondary | ICD-10-CM

## 2017-06-05 ENCOUNTER — Encounter: Payer: Self-pay | Admitting: Adult Health

## 2017-06-05 ENCOUNTER — Ambulatory Visit: Payer: Medicare Other | Admitting: Adult Health

## 2017-06-05 VITALS — BP 122/79 | HR 64 | Wt 228.2 lb

## 2017-06-05 DIAGNOSIS — R413 Other amnesia: Secondary | ICD-10-CM | POA: Diagnosis not present

## 2017-06-05 NOTE — Patient Instructions (Signed)
Your Plan:  Continue Aricept Memory score is stable If your symptoms worsen or you develop new symptoms please let us know.    Thank you for coming to see us at [facility]. I hope we have been able to provide you high quality care today.  You may receive a patient satisfaction survey over the next few weeks. We would appreciate your feedback and comments so that we may continue to improve ourselves and the health of our patients.  

## 2017-06-05 NOTE — Progress Notes (Signed)
I have read the note, and I agree with the clinical assessment and plan.  Charles K Willis   

## 2017-06-05 NOTE — Progress Notes (Signed)
PATIENT: Kiara Nelson DOB: 1934-03-26  REASON FOR VISIT: follow up HISTORY FROM: patient  HISTORY OF PRESENT ILLNESS: Today 06/05/17:  Kiara Nelson is an 83 year old female with a history of progressive memory disorder.  She returns today for follow-up.  She remains on Aricept 10 mg at bedtime.  She was unable to tolerate Namenda due to diarrhea.  She lives at home with her husband.  She is able to complete all ADLs independently but does require some prompting.  Her husband reports that in the last year he has noticed that she is having trouble remembering grandchildren names.  He reports that she introduced herself to her son-in-law recently.  She reports good appetite.  Denies any trouble sleeping.  Husband reports that she occasionally will sleep through the day if he does not wake her up.  He reports that he does give her half a tablet of Ativan in the evenings due to agitation.  He denies any hallucinations.  Patient returns today for follow-up.  HISTORY 11/30/16: [name] is a 83 year old female with a history of progressive memory disorder. She returns today for follow-up. She is currently taking Aricept 10 mg at bedtime. The patient feels that her memory may have gotten a little worse. She lives at home with her husband. She is able to complete all ADLs independently although she does require some prompting. She has a good appetite. She sleeps okay. Denies having to give up anything due to her memory. Her husband reports that she does sleep a lot. She has been diagnosed with obstructive sleep apnea however she has not been using the CPAP. She continues to wake up with headaches. She reports that her headaches get better as the day goes on. She has tried baclofen with no benefit. She states that she has been diagnosed with breast cancer again and will undergo a mastectomy or lumpectomy within the coming weeks. She returns today for an evaluation.   REVIEW OF SYSTEMS: Out of a complete  14 system review of symptoms, the patient complains only of the following symptoms, and all other reviewed systems are negative.  Apnea, daytime sleepiness, sleep talking, confusion, depression, headache, memory loss, ringing in ears  ALLERGIES: Allergies  Allergen Reactions  . Bee Venom Swelling  . Penicillins Swelling    Swelling of hands Has patient had a PCN reaction causing immediate rash, facial/tongue/throat swelling, SOB or lightheadedness with hypotension: Yes Has patient had a PCN reaction causing severe rash involving mucus membranes or skin necrosis: No Has patient had a PCN reaction that required hospitalization: No Has patient had a PCN reaction occurring within the last 10 years: No If all of the above answers are "NO", then may proceed with Cephalosporin use.   . Sulfa Antibiotics Other (See Comments)    unknown    HOME MEDICATIONS: Outpatient Medications Prior to Visit  Medication Sig Dispense Refill  . acetaminophen (TYLENOL) 325 MG tablet Take 2 tablets (650 mg total) by mouth every 6 (six) hours as needed for mild pain (or temp > 100). 30 tablet 0  . Cholecalciferol (VITAMIN D) 2000 units CAPS Take 4,000 Units by mouth every evening.    . Cinnamon 500 MG capsule Take 500 mg by mouth daily.    . Cyanocobalamin (B-12) 2000 MCG TABS Take 2,000 mcg by mouth 3 (three) times a week.    . desvenlafaxine (PRISTIQ) 100 MG 24 hr tablet TAKE ONE TABLET BY MOUTH ONCE DAILY 90 tablet 3  . donepezil (ARICEPT)  10 MG tablet TAKE ONE TABLET BY MOUTH AT BEDTIME 90 tablet 3  . exemestane (AROMASIN) 25 MG tablet Take 1 tablet (25 mg total) by mouth daily after breakfast. 90 tablet 4  . fenofibrate (TRICOR) 48 MG tablet Take 48 mg by mouth every evening.     Marland Kitchen levothyroxine (SYNTHROID, LEVOTHROID) 100 MCG tablet Take 100 mcg by mouth daily before breakfast.    . LORazepam (ATIVAN) 1 MG tablet Take 0.5 tablets by mouth at bedtime as needed and may repeat dose one time if needed for  anxiety or sleep.     Marland Kitchen losartan (COZAAR) 50 MG tablet Take 50 mg by mouth every evening.     . Multiple Vitamins-Minerals (MULTIVITAMIN WITH MINERALS) tablet Take 1 tablet by mouth daily.    Marland Kitchen nystatin (MYCOSTATIN) powder Apply topically daily. Start using after skin has healed 15 g 2  . Omega-3 Fatty Acids (FISH OIL) 1000 MG CAPS Take 1 capsule by mouth 2 (two) times daily.    . traMADol (ULTRAM) 50 MG tablet Take 50 mg by mouth 2 (two) times daily as needed for moderate pain.     . Vitamins A & D (VITAMIN A & D) ointment Apply 1 application topically as needed for dry skin (rash).    . baclofen (LIORESAL) 10 MG tablet Take 10 mg by mouth daily as needed for muscle spasms.     Marland Kitchen HYDROcodone-acetaminophen (NORCO/VICODIN) 5-325 MG tablet Take 1-2 tablets by mouth every 4 (four) hours as needed for moderate pain. (Patient not taking: Reported on 06/05/2017) 15 tablet 0  . MENTHOL-ZINC OXIDE EX Apply 1 application topically daily. Medicated body Powder    . Thiamine HCl (VITAMIN B-1) 250 MG tablet Take 250 mg by mouth daily.     No facility-administered medications prior to visit.     PAST MEDICAL HISTORY: Past Medical History:  Diagnosis Date  . Anxiety   . Arthritis   . Back pain, chronic    lumbar up to thoracic area  . Breast cancer ([place])    left breast 2006  . Cancer [facility])    Breast cancer  . Chronic low back pain 04/30/2014  . Complex endometrial hyperplasia 2004   renal insufficiency  . Dementia   . Depression 04/30/2014  . Diabetes mellitus without complication ([place])   . Genetic testing 03/24/2017   Ms. Odor underwent genetic counseling and testing for hereditary cancer syndromes on 02/01/2017. Her results were negative for mutations in all 46 genes analyzed by [facility]'s 46-gene Common Hereditary Cancers Panel. Genes analyzed include: APC, ATM, AXIN2, BARD1, BMPR1A, BRCA1, BRCA2, BRIP1, CDH1, CDKN2A, CHEK2, CTNNA1, DICER1, EPCAM, GREM1, HOXB13, KIT, MEN1, MLH1, MSH2, MSH3, MSH6,  MUTYH, NBN,   . Headache(784.0) 10/29/2013  . Hypercholesteremia   . Hypertension   . Memory loss   . Obese   . OSA on CPAP   . Personal history of radiation therapy    left breast 2006  . Pneumonia    patient's family stated "10-12 years ago"  . Recurrent cystitis     PAST SURGICAL HISTORY: Past Surgical History:  Procedure Laterality Date  . ABDOMINAL HYSTERECTOMY  2014  . BREAST BIOPSY Left    2018  . BREAST LUMPECTOMY Left    malignant 2006  . BREAST SURGERY    . CATARACT EXTRACTION, BILATERAL  09/2012  . CYSTOSCOPY  07/03/2012   Performed by Reece Packer, MD at [facility] ORS  . DILATION AND CURETTAGE OF UTERUS  2004   polyp  resection  . EXAM UNDER ANESTHESIA N/A 07/03/2012   Performed by Reece Packer, MD at [facility] ORS  . FOOT NEUROMA SURGERY    . HYSTERECTOMY VAGINAL N/A 07/03/2012   Performed by Peri Maris, MD at [facility] ORS  . LEFT SIMPLE MASTECTOMY Left 12/22/2016   Performed by Erroll Luna, MD at [facility] Left 12/22/2016  . TONSILLECTOMY  1962  . TUBAL LIGATION  1970    FAMILY HISTORY: Family History  Problem Relation Age of Onset  . Hypertension Mother   . Heart disease Mother   . Hypertension Father   . Dementia Father   . Depression Sister   . Breast cancer Sister        d.85  . Breast cancer Maternal Aunt 60       d.70s  . Breast cancer Maternal Grandmother 66       d.80s    SOCIAL HISTORY: Social History   Socioeconomic History  . Marital status: Married    Spouse name: Not on file  . Number of children: 3  . Years of education: college  . Highest education level: Not on file  Social Needs  . Financial resource strain: Not on file  . Food insecurity - worry: Not on file  . Food insecurity - inability: Not on file  . Transportation needs - medical: Not on file  . Transportation needs - non-medical: Not on file  Occupational History  . Occupation: Retired    Fish farm manager: RETIRED  Tobacco Use  . Smoking status:  Never Smoker  . Smokeless tobacco: Never Used  Substance and Sexual Activity  . Alcohol use: Yes    Alcohol/week: 0.0 oz    Comment: rarely  . Drug use: No  . Sexual activity: Yes    Partners: Male    Birth control/protection: Post-menopausal, Surgical    Comment: [facility]  Other Topics Concern  . Not on file  Social History Narrative   Patient lives at home with her husband Gwyndolyn Saxon).   Education college.   Retired. School Pharmacist, hospital.   Right handed.   Caffeine -None.      PHYSICAL EXAM  Vitals:   06/05/17 1122  BP: 122/79  Pulse: 64  Weight: 228 lb 3.2 oz (103.5 kg)   Body mass index is 39.79 kg/m.   MMSE - Mini Mental State Exam 06/05/2017 11/30/2016 10/27/2015  Orientation to time 1 0 0  Orientation to Place _0 Registration _1 Attention/ Calculation _2 Recall 0 0 2  Language- name 2 objects _3 Language- repeat 0 1 1  Language- follow 3 step command _4 Language- read & follow direction _5 Write a sentence _6 Copy design _7 Total score _8 Generalized: Well developed, in no acute distress   Neurological examination  Mentation: Alert oriented to time, place, history taking. Follows all commands speech and language fluent Cranial nerve II-XII: Pupils were equal round reactive to light. Extraocular movements were full, visual field were full on confrontational test. Facial sensation and strength were normal. Uvula tongue midline. Head turning and shoulder shrug  were normal and symmetric. Motor: The motor testing reveals 5 over 5 strength of all 4 extremities. Good symmetric motor tone is noted throughout.  Sensory: Sensory testing is intact to soft touch on all 4 extremities. No evidence of extinction is  noted.  Coordination: Cerebellar testing reveals good finger-nose-finger and heel-to-shin bilaterally.  Gait and station: Gait is normal. Tandem gait is normal. Romberg is negative. No drift is seen.  Reflexes: Deep tendon  reflexes are symmetric and normal bilaterally.   DIAGNOSTIC DATA (LABS, IMAGING, TESTING) - I reviewed patient records, labs, notes, testing and imaging myself where available.  Lab Results  Component Value Date   WBC 6.2 12/20/2016   HGB 12.6 12/20/2016   HCT 39.4 12/20/2016   MCV 95.9 12/20/2016   PLT 200 12/20/2016      Component Value Date/Time   NA 142 12/20/2016 1021   K 4.7 12/20/2016 1021   CL 107 12/20/2016 1021   CO2 26 12/20/2016 1021   GLUCOSE 122 (H) 12/20/2016 1021   BUN 29 (H) 12/20/2016 1021   CREATININE 1.54 (H) 12/20/2016 1021   CALCIUM 9.9 12/20/2016 1021   PROT 6.9 07/03/2012 0617   ALBUMIN 3.9 07/03/2012 0617   AST 18 07/03/2012 0617   ALT 9 07/03/2012 0617   ALKPHOS 58 07/03/2012 0617   BILITOT 0.4 07/03/2012 0617   GFRNONAA 30 (L) 12/20/2016 1021   GFRAA 35 (L) 12/20/2016 1021    Lab Results  Component Value Date   HGBA1C 5.5 12/20/2016   Lab Results  Component Value Date   VITAMINB12 603 10/29/2013       ASSESSMENT AND PLAN 83 y.o. year old female  has a past medical history of Anxiety, Arthritis, Back pain, chronic, Breast cancer ([place]), Cancer ([place]), Chronic low back pain (04/30/2014), Complex endometrial hyperplasia (2004), Dementia, Depression (04/30/2014), Diabetes mellitus without complication ([place]), Genetic testing (03/24/2017), Headache(784.0) (10/29/2013), Hypercholesteremia, Hypertension, Memory loss, Obese, OSA on CPAP, Personal history of radiation therapy, Pneumonia, and Recurrent cystitis. here with:  1. Memory disturbance  The patient's memory score has remained relatively stable.  She will continue on Aricept 10 mg at bedtime.  I have advised that if her or she develops new symptoms they should let us know.  She will follow back up in 6 months or sooner if needed.  I spent 15 minutes with the patient. 50% of this time was spent reviewing her memory score.     Ward Givens, MSN, NP-C 06/05/2017, 11:37 AM [facility] [address], [place], [place] [address] [phone]

## 2017-07-05 ENCOUNTER — Telehealth: Payer: Self-pay | Admitting: Medical Oncology

## 2017-07-05 ENCOUNTER — Other Ambulatory Visit: Payer: Self-pay | Admitting: *Deleted

## 2017-07-05 MED ORDER — ANASTROZOLE 1 MG PO TABS: 1.0000 mg | ORAL_TABLET | Freq: Every day | ORAL | 1 refills | Status: DC

## 2017-07-05 NOTE — Telephone Encounter (Signed)
Switch med -Husband called and requests alternative for aromasin which will be tier III in Jan.and $128/quarter.  Will Magrinat switch her to  either Anastrozole or Letrazole which is tier I =$28/quarter. She has #30 aromasin tablets . Please call new rx alternative to [facility] and d/c Aromasin.

## 2017-07-05 NOTE — Telephone Encounter (Signed)
Refill sent per MD- message left on pt's VM

## 2017-08-15 ENCOUNTER — Other Ambulatory Visit: Payer: Self-pay

## 2017-08-15 ENCOUNTER — Ambulatory Visit (INDEPENDENT_AMBULATORY_CARE_PROVIDER_SITE_OTHER): Payer: Medicare Other | Admitting: Obstetrics & Gynecology

## 2017-08-15 ENCOUNTER — Encounter: Payer: Self-pay | Admitting: Obstetrics & Gynecology

## 2017-08-15 VITALS — BP 128/86 | HR 64 | Resp 18 | Ht 63.25 in | Wt 223.0 lb

## 2017-08-15 DIAGNOSIS — Z01419 Encounter for gynecological examination (general) (routine) without abnormal findings: Secondary | ICD-10-CM | POA: Diagnosis not present

## 2017-08-15 NOTE — Progress Notes (Signed)
84 y.o. G3P3 MarriedCaucasianF here for annual exam.  Accompanied by husband again today.  Memory worsening.  Children do help.  She was diagnosed with breast cancer in 3/18 in same breast where she had a prior breast cancer.  Underwent simple mastectomy 6/18.  Husband states she is doing well.  Pt states several times that she does not like the scar and how it's healed.    Denies vaginal bleeding.  Does have some urinary incontinence.  Patient's last menstrual period was 07/18/1969 (approximate).          Sexually active: No.  The current method of family planning is status post hysterectomy. Exercising: No.  Stretching  Smoker:  no  Health Maintenance: Pap:  10/27/11 Neg  History of abnormal Pap:  no MMG: 09/18/16 [place] left Bx: malignancy proven. 12/22/16 Left Mastectomy. Screening MMG right scheduled 09/19/17 Colonoscopy:  2010 polyp.  BMD:   2009 Normal  TDaP:  PCP Pneumonia vaccine(s):  2013 Shingrix:  No Hep C testing: n/a Screening Labs: PCP   reports that  has never smoked. she has never used smokeless tobacco. She reports that she drinks alcohol. She reports that she does not use drugs.  Past Medical History:  Diagnosis Date  . Anxiety   . Arthritis   . Back pain, chronic    lumbar up to thoracic area  . Breast cancer ([place])    left breast 2006  . Cancer [facility])    Breast cancer  . Chronic low back pain 04/30/2014  . Complex endometrial hyperplasia 2004   renal insufficiency  . Dementia   . Depression 04/30/2014  . Diabetes mellitus without complication ([place])   . Genetic testing 03/24/2017   Ms. Folse underwent genetic counseling and testing for hereditary cancer syndromes on 02/01/2017. Her results were negative for mutations in all 46 genes analyzed by [facility]'s 46-gene Common Hereditary Cancers Panel. Genes analyzed include: APC, ATM, AXIN2, BARD1, BMPR1A, BRCA1, BRCA2, BRIP1, CDH1, CDKN2A, CHEK2, CTNNA1, DICER1, EPCAM, GREM1, HOXB13, KIT, MEN1, MLH1, MSH2, MSH3, MSH6, MUTYH,  NBN,   . Headache(784.0) 10/29/2013  . Hypercholesteremia   . Hypertension   . Memory loss   . Obese   . OSA on CPAP   . Personal history of radiation therapy    left breast 2006  . Pneumonia    patient's family stated "10-12 years ago"  . Recurrent cystitis     Past Surgical History:  Procedure Laterality Date  . ABDOMINAL HYSTERECTOMY  2014  . BREAST BIOPSY Left    2018  . BREAST LUMPECTOMY Left    malignant 2006  . BREAST SURGERY    . CATARACT EXTRACTION, BILATERAL  09/2012  . CYSTOSCOPY  07/03/2012   Procedure: CYSTOSCOPY;  Surgeon: Reece Packer, MD;  Location: [facility];  Service: Urology;;  . [place] OF UTERUS  2004   polyp resection  . EXAMINATION UNDER ANESTHESIA  07/03/2012   Procedure: EXAM UNDER ANESTHESIA;  Surgeon: Reece Packer, MD;  Location: [facility];  Service: Urology;  Laterality: N/A;  . FOOT NEUROMA SURGERY    . SIMPLE MASTECTOMY Left 12/22/2016  . SIMPLE MASTECTOMY WITH AXILLARY SENTINEL NODE BIOPSY Left 12/22/2016   Procedure: LEFT SIMPLE MASTECTOMY;  Surgeon: Erroll Luna, MD;  Location: [place];  Service: General;  Laterality: Left;  ANESTHESIA GENERL AND PECTORAL BLOCK  . TONSILLECTOMY  1962  . TUBAL LIGATION  1970  . VAGINAL HYSTERECTOMY  07/03/2012   Procedure: HYSTERECTOMY VAGINAL;  Surgeon: Peri Maris, MD;  Location: [facility];  Service: Gynecology;  Laterality: N/A;    Current Outpatient Medications  Medication Sig Dispense Refill  . acetaminophen (TYLENOL) 325 MG tablet Take 2 tablets (650 mg total) by mouth every 6 (six) hours as needed for mild pain (or temp > 100). 30 tablet 0  . anastrozole (ARIMIDEX) 1 MG tablet Take 1 tablet (1 mg total) by mouth daily. 90 tablet 1  . baclofen (LIORESAL) 10 MG tablet Take 10 mg by mouth daily as needed for muscle spasms.     . Cholecalciferol (VITAMIN D) 2000 units CAPS Take 4,000 Units by mouth every evening.    . Cinnamon 500 MG capsule Take 500 mg by mouth daily.    .  Cyanocobalamin (B-12) 2000 MCG TABS Take 2,000 mcg by mouth 3 (three) times a week.    . desvenlafaxine (PRISTIQ) 100 MG 24 hr tablet TAKE ONE TABLET BY MOUTH ONCE DAILY 90 tablet 3  . donepezil (ARICEPT) 10 MG tablet TAKE ONE TABLET BY MOUTH AT BEDTIME 90 tablet 3  . fenofibrate (TRICOR) 48 MG tablet Take 48 mg by mouth every evening.     Marland Kitchen levothyroxine (SYNTHROID, LEVOTHROID) 100 MCG tablet Take 100 mcg by mouth daily before breakfast.    . LORazepam (ATIVAN) 1 MG tablet Take 0.5 tablets by mouth at bedtime as needed and may repeat dose one time if needed for anxiety or sleep.     Marland Kitchen losartan (COZAAR) 50 MG tablet Take 50 mg by mouth every evening.     Marland Kitchen MENTHOL-ZINC OXIDE EX Apply 1 application topically daily. Medicated body Powder    . Multiple Vitamins-Minerals (MULTIVITAMIN WITH MINERALS) tablet Take 1 tablet by mouth daily.    . Omega-3 Fatty Acids (FISH OIL) 1000 MG CAPS Take 1 capsule by mouth 2 (two) times daily.    . Thiamine HCl (VITAMIN B-1) 250 MG tablet Take 250 mg by mouth daily.    . traMADol (ULTRAM) 50 MG tablet Take 50 mg by mouth 2 (two) times daily as needed for moderate pain.     . Vitamins A & D (VITAMIN A & D) ointment Apply 1 application topically as needed for dry skin (rash).     No current facility-administered medications for this visit.     Family History  Problem Relation Age of Onset  . Hypertension Mother   . Heart disease Mother   . Hypertension Father   . Dementia Father   . Depression Sister   . Breast cancer Sister        d.85  . Breast cancer Maternal Aunt 60       d.70s  . Breast cancer Maternal Grandmother 60       d.80s    ROS:  Pertinent items are noted in HPI.  Otherwise, a comprehensive ROS was negative.  Exam:   BP 128/86 (BP Location: Right Arm, Patient Position: Sitting, Cuff Size: Large)   Pulse 64   Resp 18   Ht 5' 3.25" (1.607 m)   Wt 223 lb (101.2 kg)   LMP 07/18/1969 (Approximate)   BMI 39.19 kg/m     Height: 5' 3.25"  (160.7 cm)  Ht Readings from Last 3 Encounters:  08/15/17 5' 3.25" (1.607 m)  01/24/17 5' 3.5" (1.613 m)  12/22/16 5' 3.5" (1.613 m)   General appearance: alert, cooperative and appears stated age Head: Normocephalic, without obvious abnormality, atraumatic Neck: no adenopathy, supple, symmetrical, trachea midline and thyroid normal to inspection and palpation Lungs: clear to auscultation  bilaterally Breasts: normal appearance, no masses or tenderness on right, absent left breast with well healed scar Heart: regular rate and rhythm Abdomen: soft, non-tender; bowel sounds normal; no masses,  no organomegaly Extremities: extremities normal, atraumatic, no cyanosis or edema Skin: Skin color, texture, turgor normal. No rashes or lesions Lymph nodes: Cervical, supraclavicular, and axillary nodes normal. No abnormal inguinal nodes palpated Neurologic: Grossly normal   Pelvic: External genitalia:  no lesions              Urethra:  normal appearing urethra with no masses, tenderness or lesions              Bartholins and Skenes: normal                 Vagina: normal appearing vagina with normal color and discharge, no lesions, second degree cystocele noted today              Cervix: absent              Pap taken: No. Bimanual Exam:  Uterus:  uterus absent              Adnexa: no mass, fullness, tenderness               Rectovaginal: Confirms               Anus:  normal sphincter tone, no lesions  Chaperone was present for exam.  A:  Well Woman with normal exam PMP, no HRT Dementia, followed by neurology Diabetes Elevated lipids Urinary incontinence with cystocele Hypothyroidism Left breast cancer 2006 and then 2018, s/p simple mastectomy  P:   Mammogram guidelines reviewed.  Still doing on right breast. pap smear not indicated Lab work and vaccines UTD Declines doing BMD at this time return annually or prn

## 2017-09-18 ENCOUNTER — Ambulatory Visit
Admission: RE | Admit: 2017-09-18 | Discharge: 2017-09-18 | Disposition: A | Discharge status: Home / Self Care | Payer: Medicare Other | Source: Ambulatory Visit | Attending: Internal Medicine | Admitting: Internal Medicine

## 2017-09-18 DIAGNOSIS — Z1231 Encounter for screening mammogram for malignant neoplasm of breast: Secondary | ICD-10-CM

## 2017-12-04 ENCOUNTER — Ambulatory Visit: Payer: Medicare Other | Admitting: Adult Health

## 2017-12-04 ENCOUNTER — Encounter: Payer: Self-pay | Admitting: Adult Health

## 2017-12-04 VITALS — BP 145/81 | HR 59 | Ht 63.25 in | Wt 224.0 lb

## 2017-12-04 DIAGNOSIS — R413 Other amnesia: Secondary | ICD-10-CM | POA: Diagnosis not present

## 2017-12-04 DIAGNOSIS — G479 Sleep disorder, unspecified: Secondary | ICD-10-CM | POA: Diagnosis not present

## 2017-12-04 NOTE — Progress Notes (Signed)
I have read the note, and I agree with the clinical assessment and plan.  Jewel Mcafee K Dayra Rapley   

## 2017-12-04 NOTE — Progress Notes (Signed)
PATIENT: Kiara Nelson DOB: May 05, 1934  REASON FOR VISIT: follow up HISTORY FROM: patient  HISTORY OF PRESENT ILLNESS: Today 12/04/17:  Ms. Kiara Nelson is an 84 year old female with a history of progressive memory disturbance.  She returns today for follow-up.  She is currently on Aricept 10 mg at bedtime.  Overall her memory has remained stable.  She continues to live at home with her husband.  She is able to complete all ADLs independently but does require some prompting.  She no longer operate a motor vehicle.  Her husband manages all the finances.  She denies any trouble sleeping.  Denies any changes with her mood. Denies hallucinations.  Her husband states that she goes to bed after midnight.  She will wake up for breakfast around 10 AM and then go back to sleep and not get up until after 2.  He states he typically has to use Ativan at bedtime otherwise she would not go to sleep.  Patient returns today for an evaluation.  HISTORY 06/05/17:   Ms. Kiara Nelson is an 84 year old female with a history of progressive memory disorder.  She returns today for follow-up.  She remains on Aricept 10 mg at bedtime.  She was unable to tolerate Namenda due to diarrhea.  She lives at home with her husband.  She is able to complete all ADLs independently but does require some prompting.  Her husband reports that in the last year he has noticed that she is having trouble remembering grandchildren names.  He reports that she introduced herself to her son-in-law recently.  She reports good appetite.  Denies any trouble sleeping.  Husband reports that she occasionally will sleep through the day if he does not wake her up.  He reports that he does give her half a tablet of Ativan in the evenings due to agitation.  He denies any hallucinations.  Patient returns today for follow-up.   REVIEW OF SYSTEMS: Out of a complete 14 system review of symptoms, the patient complains only of the following symptoms, and all other  reviewed systems are negative.   Memory loss, confusion, depression, apnea  ALLERGIES: Allergies  Allergen Reactions  . Bee Venom Swelling  . Penicillins Swelling    Swelling of hands Has patient had a PCN reaction causing immediate rash, facial/tongue/throat swelling, SOB or lightheadedness with hypotension: Yes Has patient had a PCN reaction causing severe rash involving mucus membranes or skin necrosis: No Has patient had a PCN reaction that required hospitalization: No Has patient had a PCN reaction occurring within the last 10 years: No If all of the above answers are "NO", then may proceed with Cephalosporin use.   . Sulfa Antibiotics Other (See Comments)    unknown    HOME MEDICATIONS: Outpatient Medications Prior to Visit  Medication Sig Dispense Refill  . acetaminophen (TYLENOL) 325 MG tablet Take 2 tablets (650 mg total) by mouth every 6 (six) hours as needed for mild pain (or temp > 100). 30 tablet 0  . anastrozole (ARIMIDEX) 1 MG tablet Take 1 tablet (1 mg total) by mouth daily. 90 tablet 1  . baclofen (LIORESAL) 10 MG tablet Take 10 mg by mouth daily as needed for muscle spasms.     . Cholecalciferol (VITAMIN D) 2000 units CAPS Take 4,000 Units by mouth every evening.    . Cinnamon 500 MG capsule Take 500 mg by mouth daily.    . Cyanocobalamin (B-12) 2000 MCG TABS Take 2,000 mcg by mouth 3 (three) times  a week.    . desvenlafaxine (PRISTIQ) 100 MG 24 hr tablet TAKE ONE TABLET BY MOUTH ONCE DAILY 90 tablet 3  . donepezil (ARICEPT) 10 MG tablet TAKE ONE TABLET BY MOUTH AT BEDTIME 90 tablet 3  . fenofibrate (TRICOR) 48 MG tablet Take 48 mg by mouth every evening.     Marland Kitchen levothyroxine (SYNTHROID, LEVOTHROID) 100 MCG tablet Take 100 mcg by mouth daily before breakfast.    . LORazepam (ATIVAN) 1 MG tablet Take 0.5 tablets by mouth at bedtime as needed and may repeat dose one time if needed for anxiety or sleep.     Marland Kitchen losartan (COZAAR) 50 MG tablet Take 50 mg by mouth every  evening.     Marland Kitchen MENTHOL-ZINC OXIDE EX Apply 1 application topically daily. Medicated body Powder    . Multiple Vitamins-Minerals (MULTIVITAMIN WITH MINERALS) tablet Take 1 tablet by mouth daily.    . Omega-3 Fatty Acids (FISH OIL) 1000 MG CAPS Take 1 capsule by mouth 2 (two) times daily.    . Pyridoxine HCl (VITAMIN B-6) 250 MG tablet Take 250 mg by mouth daily.    . Vitamins A & D (VITAMIN A & D) ointment Apply 1 application topically as needed for dry skin (rash).    . Thiamine HCl (VITAMIN B-1) 250 MG tablet Take 250 mg by mouth daily.    . traMADol (ULTRAM) 50 MG tablet Take 50 mg by mouth 2 (two) times daily as needed for moderate pain.      No facility-administered medications prior to visit.     PAST MEDICAL HISTORY: Past Medical History:  Diagnosis Date  . Anxiety   . Arthritis   . Back pain, chronic    lumbar up to thoracic area  . Breast cancer ([place])    left breast 2006  . Cancer [facility])    Breast cancer  . Chronic low back pain 04/30/2014  . Complex endometrial hyperplasia 2004   renal insufficiency  . Dementia   . Depression 04/30/2014  . Diabetes mellitus without complication ([place])   . Genetic testing 03/24/2017   Ms. Tenaglia underwent genetic counseling and testing for hereditary cancer syndromes on 02/01/2017. Her results were negative for mutations in all 46 genes analyzed by [facility]'s 46-gene Common Hereditary Cancers Panel. Genes analyzed include: APC, ATM, AXIN2, BARD1, BMPR1A, BRCA1, BRCA2, BRIP1, CDH1, CDKN2A, CHEK2, CTNNA1, DICER1, EPCAM, GREM1, HOXB13, KIT, MEN1, MLH1, MSH2, MSH3, MSH6, MUTYH, NBN,   . Headache(784.0) 10/29/2013  . Hypercholesteremia   . Hypertension   . Memory loss   . Obese   . OSA on CPAP   . Personal history of radiation therapy    left breast 2006  . Pneumonia    patient's family stated "10-12 years ago"  . Recurrent cystitis     PAST SURGICAL HISTORY: Past Surgical History:  Procedure Laterality Date  . ABDOMINAL HYSTERECTOMY  2014    . BREAST BIOPSY Left    2018  . BREAST LUMPECTOMY Left    malignant 2006  . BREAST SURGERY    . CATARACT EXTRACTION, BILATERAL  09/2012  . CYSTOSCOPY  07/03/2012   Procedure: CYSTOSCOPY;  Surgeon: Reece Packer, MD;  Location: [facility];  Service: Urology;;  . [place] OF UTERUS  2004   polyp resection  . EXAMINATION UNDER ANESTHESIA  07/03/2012   Procedure: EXAM UNDER ANESTHESIA;  Surgeon: Reece Packer, MD;  Location: [facility];  Service: Urology;  Laterality: N/A;  . FOOT NEUROMA SURGERY    .  MASTECTOMY Left 2018   malignant  . SIMPLE MASTECTOMY Left 12/22/2016  . SIMPLE MASTECTOMY WITH AXILLARY SENTINEL NODE BIOPSY Left 12/22/2016   Procedure: LEFT SIMPLE MASTECTOMY;  Surgeon: Erroll Luna, MD;  Location: [place];  Service: General;  Laterality: Left;  ANESTHESIA GENERL AND PECTORAL BLOCK  . TONSILLECTOMY  1962  . TUBAL LIGATION  1970  . VAGINAL HYSTERECTOMY  07/03/2012   Procedure: HYSTERECTOMY VAGINAL;  Surgeon: Peri Maris, MD;  Location: [facility];  Service: Gynecology;  Laterality: N/A;    FAMILY HISTORY: Family History  Problem Relation Age of Onset  . Hypertension Mother   . Heart disease Mother   . Hypertension Father   . Dementia Father   . Depression Sister   . Breast cancer Sister        d.85  . Breast cancer Maternal Aunt 60       d.70s  . Breast cancer Maternal Grandmother 4       d.80s    SOCIAL HISTORY: Social History   Socioeconomic History  . Marital status: Married    Spouse name: Not on file  . Number of children: 3  . Years of education: college  . Highest education level: Not on file  Occupational History  . Occupation: Retired    Fish farm manager: RETIRED  Social Needs  . Financial resource strain: Not on file  . Food insecurity:    Worry: Not on file    Inability: Not on file  . Transportation needs:    Medical: Not on file    Non-medical: Not on file  Tobacco Use  . Smoking status: Never Smoker  . Smokeless  tobacco: Never Used  Substance and Sexual Activity  . Alcohol use: Yes    Alcohol/week: 0.0 oz    Comment: rarely  . Drug use: No  . Sexual activity: Yes    Partners: Male    Birth control/protection: Post-menopausal, Surgical    Comment: TVH  Lifestyle  . Physical activity:    Days per week: Not on file    Minutes per session: Not on file  . Stress: Not on file  Relationships  . Social connections:    Talks on phone: Not on file    Gets together: Not on file    Attends religious service: Not on file    Active member of club or organization: Not on file    Attends meetings of clubs or organizations: Not on file    Relationship status: Not on file  . Intimate partner violence:    Fear of current or ex partner: Not on file    Emotionally abused: Not on file    Physically abused: Not on file    Forced sexual activity: Not on file  Other Topics Concern  . Not on file  Social History Narrative   Patient lives at home with her husband Gwyndolyn Saxon).   Education college.   Retired. School Pharmacist, hospital.   Right handed.   Caffeine -None.      PHYSICAL EXAM  Vitals:   12/04/17 1115  BP: (!) 145/81  Pulse: (!) 59  Weight: 224 lb (101.6 kg)  Height: 5' 3.25" (1.607 m)   Body mass index is 39.37 kg/m.   MMSE - Mini Mental State Exam 12/04/2017 06/05/2017 11/30/2016  Orientation to time 2 1 0  Orientation to Place 2 3 5   Registration 3 3 3   Attention/ Calculation 1 1 1   Recall 0 0 0  Language- name 2 objects 2 2  2  Language- repeat 0 0 1  Language- follow 3 step command 2 3 3   Language- follow 3 step command-comments held it her hand - -  Language- read & follow direction 1 1 1   Write a sentence 1 1 1   Copy design 1 1 1   Total score 15 16 18      Generalized: Well developed, in no acute distress   Neurological examination  Mentation: Alert oriented to time, place, history taking. Follows all commands speech and language fluent Cranial nerve II-XII: Pupils were equal  round reactive to light. Extraocular movements were full, visual field were full on confrontational test. Facial sensation and strength were normal. Uvula tongue midline. Head turning and shoulder shrug  were normal and symmetric. Motor: The motor testing reveals 5 over 5 strength of all 4 extremities. Good symmetric motor tone is noted throughout.  Sensory: Sensory testing is intact to soft touch on all 4 extremities. No evidence of extinction is noted.  Coordination: Cerebellar testing reveals good finger-nose-finger and heel-to-shin bilaterally.  Gait and station: Gait is normal. Reflexes: Deep tendon reflexes are symmetric and normal bilaterally.   DIAGNOSTIC DATA (LABS, IMAGING, TESTING) - I reviewed patient records, labs, notes, testing and imaging myself where available.  Lab Results  Component Value Date   WBC 6.2 12/20/2016   HGB 12.6 12/20/2016   HCT 39.4 12/20/2016   MCV 95.9 12/20/2016   PLT 200 12/20/2016      Component Value Date/Time   NA 142 12/20/2016 1021   K 4.7 12/20/2016 1021   CL 107 12/20/2016 1021   CO2 26 12/20/2016 1021   GLUCOSE 122 (H) 12/20/2016 1021   BUN 29 (H) 12/20/2016 1021   CREATININE 1.54 (H) 12/20/2016 1021   CALCIUM 9.9 12/20/2016 1021   PROT 6.9 07/03/2012 0617   ALBUMIN 3.9 07/03/2012 0617   AST 18 07/03/2012 0617   ALT 9 07/03/2012 0617   ALKPHOS 58 07/03/2012 0617   BILITOT 0.4 07/03/2012 0617   GFRNONAA 30 (L) 12/20/2016 1021   GFRAA 35 (L) 12/20/2016 1021   No results found for: CHOL, HDL, LDLCALC, LDLDIRECT, TRIG, CHOLHDL Lab Results  Component Value Date   HGBA1C 5.5 12/20/2016   Lab Results  Component Value Date   VITAMINB12 603 10/29/2013   No results found for: TSH    ASSESSMENT AND PLAN 84 y.o. year old female  has a past medical history of Anxiety, Arthritis, Back pain, chronic, Breast cancer ([place]), Cancer ([place]), Chronic low back pain (04/30/2014), Complex endometrial hyperplasia (2004), Dementia, Depression  (04/30/2014), Diabetes mellitus without complication ([place]), Genetic testing (03/24/2017), Headache(784.0) (10/29/2013), Hypercholesteremia, Hypertension, Memory loss, Obese, OSA on CPAP, Personal history of radiation therapy, Pneumonia, and Recurrent cystitis. here with:  1.  Memory disturbance 2. Sleep disturbance  The patient's memory score has remained stable.  She will continue on Aricept 10 mg at bedtime.  We discussed the patient's sleep hygiene and ways to improve sleep habits.  I have advised that if her symptoms worsen or she develops new symptoms she should let us know.  She will follow-up in 6 months or sooner if needed.  I spent 15 minutes with the patient. 50% of this time was spent reviewing memory score and sleeping habits   Ward Givens, MSN, NP-C 12/04/2017, 11:35 AM [facility] [address] [place],  [address] [phone]

## 2017-12-04 NOTE — Patient Instructions (Signed)
Your Plan:  Continue Aricept  Memory score stable If your symptoms worsen or you develop new symptoms please let us know.    Thank you for coming to see [place] at [facility]. I hope we have been able to provide you high quality care today.  You may receive a patient satisfaction survey over the next few weeks. We would appreciate your feedback and comments so that we may continue to improve ourselves and the health of our patients.

## 2018-02-12 ENCOUNTER — Other Ambulatory Visit: Payer: Self-pay | Admitting: Neurology

## 2018-03-04 ENCOUNTER — Other Ambulatory Visit: Payer: Self-pay | Admitting: Oncology

## 2018-04-03 ENCOUNTER — Other Ambulatory Visit: Payer: Self-pay | Admitting: Adult Health

## 2018-06-11 ENCOUNTER — Ambulatory Visit: Payer: Medicare Other | Admitting: Adult Health

## 2018-06-12 ENCOUNTER — Encounter: Payer: Self-pay | Admitting: Adult Health

## 2018-06-12 ENCOUNTER — Ambulatory Visit: Payer: Medicare Other | Admitting: Adult Health

## 2018-06-12 VITALS — BP 131/68 | HR 55 | Ht 63.25 in | Wt 219.2 lb

## 2018-06-12 DIAGNOSIS — R413 Other amnesia: Secondary | ICD-10-CM

## 2018-06-12 MED ORDER — DONEPEZIL HCL 23 MG PO TABS: 23.0000 mg | ORAL_TABLET | Freq: Every day | ORAL | 3 refills | Status: DC

## 2018-06-12 NOTE — Progress Notes (Signed)
I have read the note, and I agree with the clinical assessment and plan.  Jaymarie Yeakel K Waldine Zenz   

## 2018-06-12 NOTE — Patient Instructions (Addendum)
Your Plan:  Increase aricept dose from 10mg  to 23mg  due to worsening dementia   Please call with worsening, intolerance to increased dose, or behavior issues, please call us   Follow up in 6 months or call earlier if needed     Thank you for coming to see [place] at [facility]. I hope we have been able to provide you high quality care today.  You may receive a patient satisfaction survey over the next few weeks. We would appreciate your feedback and comments so that we may continue to improve ourselves and the health of our patients.

## 2018-06-12 NOTE — Progress Notes (Signed)
PATIENT: Kiara Nelson DOB: [birth year]  REASON FOR VISIT: follow up HISTORY FROM: patient  HISTORY OF PRESENT ILLNESS: Today 06/12/18: Patient returns today for 82-monthfollow-up visit with history of progressive memory disturbance.  She continues on Aricept 10 mg daily at bedtime.  Husband believes that memory has declined since prior visit.  MMSE today 14/30 (prior 15/30). He is concerned regarding patient consistently asking if her mother is living (she passed in 2011), she also has difficulty remembering if she and her husband are married, unable to state the name of her town [place] along with believing she drove the car but unable to find her car while she was home. He states he has difficulty getting her to sleep therefore will give her ativan and then will have a difficult time awakening her in the morning. She also in concerned when her children are not home by dark but her children are adults and live in their own home. He denies any behavioral issues.  He does get some outside assistance by family members but he is her main caregiver.    History 12/04/2017 MM: Ms. JLohmannis an 84year old female with a history of progressive memory disturbance.  She returns today for follow-up.  She is currently on Aricept 10 mg at bedtime.  Overall her memory has remained stable.  She continues to live at home with her husband.  She is able to complete all ADLs independently but does require some prompting.  She no longer operate a motor vehicle.  Her husband manages all the finances.  She denies any trouble sleeping.  Denies any changes with her mood. Denies hallucinations.  Her husband states that she goes to bed after midnight.  She will wake up for breakfast around 10 AM and then go back to sleep and not get up until after 2.  He states he typically has to use Ativan at bedtime otherwise she would not go to sleep.  Patient returns today for an  evaluation.  HISTORY 06/05/17:   Ms. JHolianis an 84year old female with a history of progressive memory disorder.  She returns today for follow-up.  She remains on Aricept 10 mg at bedtime.  She was unable to tolerate Namenda due to diarrhea.  She lives at home with her husband.  She is able to complete all ADLs independently but does require some prompting.  Her husband reports that in the last year he has noticed that she is having trouble remembering grandchildren names.  He reports that she introduced herself to her son-in-law recently.  She reports good appetite.  Denies any trouble sleeping.  Husband reports that she occasionally will sleep through the day if he does not wake her up.  He reports that he does give her half a tablet of Ativan in the evenings due to agitation.  He denies any hallucinations.  Patient returns today for follow-up.   REVIEW OF SYSTEMS: Out of a complete 14 system review of symptoms, the patient complains only of the following symptoms, and all other reviewed systems are negative.  Memory loss, headache, confusion, depression, apnea, sleep talking, and back pain  ALLERGIES: Allergies  Allergen Reactions  . Bee Venom Swelling  . Penicillins Swelling    Swelling of hands Has patient had a PCN reaction causing immediate rash, facial/tongue/throat swelling, SOB or lightheadedness with hypotension: Yes Has patient had a PCN reaction causing severe rash involving mucus membranes or skin necrosis:  No Has patient had a PCN reaction that required hospitalization: No Has patient had a PCN reaction occurring within the last 10 years: No If all of the above answers are "NO", then may proceed with Cephalosporin use.   . Sulfa Antibiotics Other (See Comments)    unknown    HOME MEDICATIONS: Outpatient Medications Prior to Visit  Medication Sig Dispense Refill  . acetaminophen (TYLENOL) 325 MG tablet Take 2 tablets (650 mg total) by mouth every 6 (six) hours as needed  for mild pain (or temp > 100). 30 tablet 0  . anastrozole (ARIMIDEX) 1 MG tablet TAKE 1 TABLET BY MOUTH ONCE DAILY 90 tablet 1  . baclofen (LIORESAL) 10 MG tablet Take 10 mg by mouth daily as needed for muscle spasms.     . Cholecalciferol (VITAMIN D) 2000 units CAPS Take 4,000 Units by mouth every evening.    . Cinnamon 500 MG capsule Take 500 mg by mouth daily.    . Cyanocobalamin (B-12) 2000 MCG TABS Take 2,000 mcg by mouth 3 (three) times a week.    . desvenlafaxine (PRISTIQ) 100 MG 24 hr tablet TAKE 1 TABLET BY MOUTH ONCE DAILY 90 tablet 3  . fenofibrate (TRICOR) 48 MG tablet Take 48 mg by mouth every evening.     Marland Kitchen levothyroxine (SYNTHROID, LEVOTHROID) 100 MCG tablet Take 100 mcg by mouth daily before breakfast.    . LORazepam (ATIVAN) 1 MG tablet Take 0.5 tablets by mouth at bedtime as needed and may repeat dose one time if needed for anxiety or sleep.     Marland Kitchen losartan (COZAAR) 50 MG tablet Take 50 mg by mouth every evening.     Marland Kitchen MENTHOL-ZINC OXIDE EX Apply 1 application topically daily. Medicated body Powder    . Multiple Vitamins-Minerals (MULTIVITAMIN WITH MINERALS) tablet Take 1 tablet by mouth daily.    . Omega-3 Fatty Acids (FISH OIL) 1000 MG CAPS Take 1 capsule by mouth 2 (two) times daily.    . Pyridoxine HCl (VITAMIN B-6) 250 MG tablet Take 250 mg by mouth daily.    . traMADol (ULTRAM) 50 MG tablet Take 50 mg by mouth 2 (two) times daily as needed for moderate pain.     . Vitamins A & D (VITAMIN A & D) ointment Apply 1 application topically as needed for dry skin (rash).    . donepezil (ARICEPT) 10 MG tablet TAKE 1 TABLET BY MOUTH AT BEDTIME 90 tablet 3   No facility-administered medications prior to visit.     PAST MEDICAL HISTORY: Past Medical History:  Diagnosis Date  . Anxiety   . Arthritis   . Back pain, chronic    lumbar up to thoracic area  . Breast cancer ([place])    left breast 2006  . Cancer [facility])    Breast cancer  . Chronic low back pain 04/30/2014  . Complex  endometrial hyperplasia 2004   renal insufficiency  . Dementia ([place])   . Depression 04/30/2014  . Diabetes mellitus without complication ([place])   . Genetic testing 03/24/2017   Ms. Reta underwent genetic counseling and testing for hereditary cancer syndromes on 02/01/2017. Her results were negative for mutations in all 46 genes analyzed by [facility]'s 46-gene Common Hereditary Cancers Panel. Genes analyzed include: APC, ATM, AXIN2, BARD1, BMPR1A, BRCA1, BRCA2, BRIP1, CDH1, CDKN2A, CHEK2, CTNNA1, DICER1, EPCAM, GREM1, HOXB13, KIT, MEN1, MLH1, MSH2, MSH3, MSH6, MUTYH, NBN,   . Headache(784.0) 10/29/2013  . Hypercholesteremia   . Hypertension   . Memory loss   .  Obese   . OSA on CPAP   . Personal history of radiation therapy    left breast 2006  . Pneumonia    patient's family stated "10-12 years ago"  . Recurrent cystitis     PAST SURGICAL HISTORY: Past Surgical History:  Procedure Laterality Date  . ABDOMINAL HYSTERECTOMY  2014  . BREAST BIOPSY Left    2018  . BREAST LUMPECTOMY Left    malignant 2006  . BREAST SURGERY    . CATARACT EXTRACTION, BILATERAL  09/2012  . CYSTOSCOPY  07/03/2012   Procedure: CYSTOSCOPY;  Surgeon: Reece Packer, MD;  Location: [facility];  Service: Urology;;  . [place] OF UTERUS  2004   polyp resection  . EXAMINATION UNDER ANESTHESIA  07/03/2012   Procedure: EXAM UNDER ANESTHESIA;  Surgeon: Reece Packer, MD;  Location: [facility];  Service: Urology;  Laterality: N/A;  . FOOT NEUROMA SURGERY    . MASTECTOMY Left 2018   malignant  . SIMPLE MASTECTOMY Left 12/22/2016  . SIMPLE MASTECTOMY WITH AXILLARY SENTINEL NODE BIOPSY Left 12/22/2016   Procedure: LEFT SIMPLE MASTECTOMY;  Surgeon: Erroll Luna, MD;  Location: [place] [facility];  Service: General;  Laterality: Left;  ANESTHESIA GENERL AND PECTORAL BLOCK  . TONSILLECTOMY  1962  . TUBAL LIGATION  1970  . VAGINAL HYSTERECTOMY  07/03/2012   Procedure: HYSTERECTOMY VAGINAL;  Surgeon: Peri Maris,  MD;  Location: [facility];  Service: Gynecology;  Laterality: N/A;    FAMILY HISTORY: Family History  Problem Relation Age of Onset  . Hypertension Mother   . Heart disease Mother   . Hypertension Father   . Dementia Father   . Depression Sister   . Breast cancer Sister        d.85  . Breast cancer Maternal Aunt 60       d.70s  . Breast cancer Maternal Grandmother 1       d.80s    SOCIAL HISTORY: Social History   Socioeconomic History  . Marital status: Married    Spouse name: Not on file  . Number of children: 3  . Years of education: college  . Highest education level: Not on file  Occupational History  . Occupation: Retired    Fish farm manager: RETIRED  Social Needs  . Financial resource strain: Not on file  . Food insecurity:    Worry: Not on file    Inability: Not on file  . Transportation needs:    Medical: Not on file    Non-medical: Not on file  Tobacco Use  . Smoking status: Never Smoker  . Smokeless tobacco: Never Used  Substance and Sexual Activity  . Alcohol use: Yes    Alcohol/week: 0.0 standard drinks    Comment: rarely  . Drug use: No  . Sexual activity: Yes    Partners: Male    Birth control/protection: Post-menopausal, Surgical    Comment: TVH  Lifestyle  . Physical activity:    Days per week: Not on file    Minutes per session: Not on file  . Stress: Not on file  Relationships  . Social connections:    Talks on phone: Not on file    Gets together: Not on file    Attends religious service: Not on file    Active member of club or organization: Not on file    Attends meetings of clubs or organizations: Not on file    Relationship status: Not on file  . Intimate partner violence:  Fear of current or ex partner: Not on file    Emotionally abused: Not on file    Physically abused: Not on file    Forced sexual activity: Not on file  Other Topics Concern  . Not on file  Social History Narrative   Patient lives at home with her husband  Gwyndolyn Saxon).   Education college.   Retired. School Pharmacist, hospital.   Right handed.   Caffeine -None.      PHYSICAL EXAM  Vitals:   06/12/18 1108  BP: 131/68  Pulse: (!) 55  Weight: 219 lb 3.2 oz (99.4 kg)  Height: 5' 3.25" (1.607 m)   Body mass index is 38.52 kg/m.   MMSE - Mini Mental State Exam 06/12/2018 12/04/2017 06/05/2017  Not completed: (No Data) - -  Orientation to time 1 2 1   Orientation to Place 3 2 3   Registration 3 3 3   Attention/ Calculation 1 1 1   Recall 0 0 0  Language- name 2 objects 2 2 2   Language- repeat 0 0 0  Language- follow 3 step command 1 2 3   Language- follow 3 step command-comments - held it her hand -  Language- read & follow direction 1 1 1   Write a sentence 1 1 1   Copy design 1 1 1   Total score 14 15 16      Generalized: Well developed, in no acute distress   Neurological examination  Mentation: Alert oriented to time, place, history taking. Follows all commands speech and language fluent Cranial nerve II-XII: Pupils were equal round reactive to light. Extraocular movements were full, visual field were full on confrontational test. Facial sensation and strength were normal. Uvula tongue midline. Head turning and shoulder shrug  were normal and symmetric. Motor: The motor testing reveals 5 over 5 strength of all 4 extremities. Good symmetric motor tone is noted throughout.  Sensory: Sensory testing is intact to soft touch on all 4 extremities. No evidence of extinction is noted.  Coordination: Cerebellar testing reveals good finger-nose-finger and heel-to-shin bilaterally.  Gait and station: Gait is normal. Reflexes: Deep tendon reflexes are symmetric and normal bilaterally.   DIAGNOSTIC DATA (LABS, IMAGING, TESTING) - I reviewed patient records, labs, notes, testing and imaging myself where available.  Lab Results  Component Value Date   WBC 6.2 12/20/2016   HGB 12.6 12/20/2016   HCT 39.4 12/20/2016   MCV 95.9 12/20/2016   PLT 200  12/20/2016      Component Value Date/Time   NA 142 12/20/2016 1021   K 4.7 12/20/2016 1021   CL 107 12/20/2016 1021   CO2 26 12/20/2016 1021   GLUCOSE 122 (H) 12/20/2016 1021   BUN 29 (H) 12/20/2016 1021   CREATININE 1.54 (H) 12/20/2016 1021   CALCIUM 9.9 12/20/2016 1021   PROT 6.9 07/03/2012 0617   ALBUMIN 3.9 07/03/2012 0617   AST 18 07/03/2012 0617   ALT 9 07/03/2012 0617   ALKPHOS 58 07/03/2012 0617   BILITOT 0.4 07/03/2012 0617   GFRNONAA 30 (L) 12/20/2016 1021   GFRAA 35 (L) 12/20/2016 1021   No results found for: CHOL, HDL, LDLCALC, LDLDIRECT, TRIG, CHOLHDL Lab Results  Component Value Date   HGBA1C 5.5 12/20/2016   Lab Results  Component Value Date   [id number] 974 10/29/2013   No results found for: TSH    ASSESSMENT AND PLAN 84 y.o. year old female  has a past medical history of Anxiety, Arthritis, Back pain, chronic, Breast cancer ([place]), Cancer ([place]), Chronic low back  pain (04/30/2014), Complex endometrial hyperplasia (2004), Dementia [facility]), Depression (04/30/2014), Diabetes mellitus without complication ([place]), Genetic testing (03/24/2017), Headache(784.0) (10/29/2013), Hypercholesteremia, Hypertension, Memory loss, Obese, OSA on CPAP, Personal history of radiation therapy, Pneumonia, and Recurrent cystitis. here with:  1.  Memory disturbance 2. Sleep disturbance  Due to worsening of dementia, we will increase aricept to 94m daily. Highly encouraged using resources for respite as he endorses numerous frustrations regarding her memory.  Due to continuation of Ativan use, again reviewed sleep hygiene habits along with encouraging activities during the day to avoid daytime naps.  Advised him that if she continues to worsen despite increased Aricept, unable to tolerate increased dose of Aricept, or begins to experience behavioral issues to call office.  Follow-up appointment in 6 months time or call sooner if needed needed.   I spent 15 minutes with the patient. 50% of  this time was spent reviewing memory score and sleeping habits   JVenancio Poisson AGNP-BC  [facility] [address]SF[address]is[address]Andres Loa [address] Phone [phone] [phone]: This document was prepared with digital dictation and possible smart phrase technology. Any transcriptional errors that result from this process are unintentional.

## 2018-08-17 ENCOUNTER — Other Ambulatory Visit: Payer: Self-pay | Admitting: Internal Medicine

## 2018-08-17 DIAGNOSIS — Z1231 Encounter for screening mammogram for malignant neoplasm of breast: Secondary | ICD-10-CM

## 2018-08-20 ENCOUNTER — Other Ambulatory Visit: Payer: Self-pay

## 2018-08-20 ENCOUNTER — Encounter: Payer: Self-pay | Admitting: Obstetrics & Gynecology

## 2018-08-20 ENCOUNTER — Ambulatory Visit (INDEPENDENT_AMBULATORY_CARE_PROVIDER_SITE_OTHER): Payer: Medicare Other | Admitting: Obstetrics & Gynecology

## 2018-08-20 VITALS — BP 146/80 | HR 76 | Resp 16 | Ht 63.25 in | Wt 223.4 lb

## 2018-08-20 DIAGNOSIS — Z01419 Encounter for gynecological examination (general) (routine) without abnormal findings: Secondary | ICD-10-CM | POA: Diagnosis not present

## 2018-08-20 DIAGNOSIS — E2839 Other primary ovarian failure: Secondary | ICD-10-CM | POA: Diagnosis not present

## 2018-08-20 NOTE — Progress Notes (Signed)
Called patient's husband. Left voicemail with MMG and BMD appt information.   Appt scheduled for 10/15/18 at 2:30pm.

## 2018-08-20 NOTE — Progress Notes (Addendum)
85 y.o. G3P3 Married White or Caucasian female here for annual exam.  Husband with her due to memory issues.  He has help two days a week now.  She continues to have urinary leakage.  He has not seen any blood.  Pt denies vaginal bleeding as well.  Did have flu shot.  Denies vaginal bleeding.    Patient's last menstrual period was 07/18/1969 (approximate).          Sexually active: No.  The current method of family planning is status post hysterectomy.    Exercising: Yes.    bike Smoker:  no  Health Maintenance: Pap:  10/27/11 Neg  History of abnormal Pap:  no MMG:  09/18/17 Right Breast BIRADS1:neg. Has appt 09/21/18  Colonoscopy:  2010 polyp.  BMD:   05/14/08 Normal  TDaP:  2019 Pneumonia vaccine(s):  2013 Shingrix:   No Hep C testing: n/a Screening Labs: PCP   reports that she has never smoked. She has never used smokeless tobacco. She reports previous alcohol use. She reports that she does not use drugs.  Past Medical History:  Diagnosis Date  . Anxiety   . Arthritis   . Back pain, chronic    lumbar up to thoracic area  . Breast cancer ([place])    left breast 2006  . Cancer [facility])    Breast cancer  . Chronic low back pain 04/30/2014  . Complex endometrial hyperplasia 2004   renal insufficiency  . Dementia ([place])   . Depression 04/30/2014  . Diabetes mellitus without complication ([place])   . Genetic testing 03/24/2017   Ms. Euceda underwent genetic counseling and testing for hereditary cancer syndromes on 02/01/2017. Her results were negative for mutations in all 46 genes analyzed by [facility]'s 46-gene Common Hereditary Cancers Panel. Genes analyzed include: APC, ATM, AXIN2, BARD1, BMPR1A, BRCA1, BRCA2, BRIP1, CDH1, CDKN2A, CHEK2, CTNNA1, DICER1, EPCAM, GREM1, HOXB13, KIT, MEN1, MLH1, MSH2, MSH3, MSH6, MUTYH, NBN,   . Headache(784.0) 10/29/2013  . Hypercholesteremia   . Hypertension   . Memory loss   . Obese   . OSA on CPAP   . Personal history of radiation therapy    left breast  2006  . Pneumonia    patient's family stated "10-12 years ago"  . Recurrent cystitis     Past Surgical History:  Procedure Laterality Date  . ABDOMINAL HYSTERECTOMY  2014  . BREAST BIOPSY Left    2018  . BREAST LUMPECTOMY Left    malignant 2006  . BREAST SURGERY    . CATARACT EXTRACTION, BILATERAL  09/2012  . CYSTOSCOPY  07/03/2012   Procedure: CYSTOSCOPY;  Surgeon: Reece Packer, MD;  Location: [facility];  Service: Urology;;  . [place] OF UTERUS  2004   polyp resection  . EXAMINATION UNDER ANESTHESIA  07/03/2012   Procedure: EXAM UNDER ANESTHESIA;  Surgeon: Reece Packer, MD;  Location: [facility];  Service: Urology;  Laterality: N/A;  . FOOT NEUROMA SURGERY    . MASTECTOMY Left 2018   malignant  . SIMPLE MASTECTOMY Left 12/22/2016  . SIMPLE MASTECTOMY WITH AXILLARY SENTINEL NODE BIOPSY Left 12/22/2016   Procedure: LEFT SIMPLE MASTECTOMY;  Surgeon: Erroll Luna, MD;  Location: [place];  Service: General;  Laterality: Left;  ANESTHESIA GENERL AND PECTORAL BLOCK  . TONSILLECTOMY  1962  . TUBAL LIGATION  1970  . VAGINAL HYSTERECTOMY  07/03/2012   Procedure: HYSTERECTOMY VAGINAL;  Surgeon: Peri Maris, MD;  Location: [facility];  Service: Gynecology;  Laterality: N/A;  Current Outpatient Medications  Medication Sig Dispense Refill  . acetaminophen (TYLENOL) 325 MG tablet Take 2 tablets (650 mg total) by mouth every 6 (six) hours as needed for mild pain (or temp > 100). 30 tablet 0  . anastrozole (ARIMIDEX) 1 MG tablet TAKE 1 TABLET BY MOUTH ONCE DAILY 90 tablet 1  . baclofen (LIORESAL) 10 MG tablet Take 10 mg by mouth daily as needed for muscle spasms.     . Cholecalciferol (VITAMIN D) 2000 units CAPS Take 4,000 Units by mouth every evening.    . Cinnamon 500 MG capsule Take 500 mg by mouth daily.    . Cyanocobalamin (B-12) 2000 MCG TABS Take 2,000 mcg by mouth 3 (three) times a week.    . desvenlafaxine (PRISTIQ) 100 MG 24 hr tablet TAKE 1 TABLET BY MOUTH  ONCE DAILY 90 tablet 3  . donepezil (ARICEPT) 23 MG TABS tablet Take 1 tablet (23 mg total) by mouth at bedtime. 30 tablet 3  . fenofibrate (TRICOR) 48 MG tablet Take 48 mg by mouth every evening.     Marland Kitchen levothyroxine (SYNTHROID, LEVOTHROID) 100 MCG tablet Take 100 mcg by mouth daily before breakfast.    . LORazepam (ATIVAN) 1 MG tablet Take 0.5 tablets by mouth at bedtime as needed and may repeat dose one time if needed for anxiety or sleep.     Marland Kitchen losartan (COZAAR) 50 MG tablet Take 50 mg by mouth every evening.     Marland Kitchen MENTHOL-ZINC OXIDE EX Apply 1 application topically daily. Medicated body Powder    . Multiple Vitamins-Minerals (MULTIVITAMIN WITH MINERALS) tablet Take 1 tablet by mouth daily.    . Omega-3 Fatty Acids (FISH OIL) 1000 MG CAPS Take 1 capsule by mouth 2 (two) times daily.    . Pyridoxine HCl (VITAMIN B-6) 250 MG tablet Take 250 mg by mouth daily.    . traMADol (ULTRAM) 50 MG tablet Take 50 mg by mouth 2 (two) times daily as needed for moderate pain.     . Vitamins A & D (VITAMIN A & D) ointment Apply 1 application topically as needed for dry skin (rash).     No current facility-administered medications for this visit.     Family History  Problem Relation Age of Onset  . Hypertension Mother   . Heart disease Mother   . Hypertension Father   . Dementia Father   . Depression Sister   . Breast cancer Sister        d.85  . Breast cancer Maternal Aunt 60       d.70s  . Breast cancer Maternal Grandmother 60       d.80s    Review of Systems  Psychiatric/Behavioral:       Depression Difficulty with memory   All other systems reviewed and are negative.   Exam:   BP (!) 146/80 (BP Location: Left Arm, Patient Position: Sitting, Cuff Size: Large)   Pulse 76   Resp 16   Ht 5' 3.25" (1.607 m)   Wt 223 lb 6.4 oz (101.3 kg)   LMP 07/18/1969 (Approximate)   BMI 39.26 kg/m    Height: 5' 3.25" (160.7 cm)  Ht Readings from Last 3 Encounters:  08/20/18 5' 3.25" (1.607 m)   06/12/18 5' 3.25" (1.607 m)  12/04/17 5' 3.25" (1.607 m)    General appearance: alert, cooperative and appears stated age Head: Normocephalic, without obvious abnormality, atraumatic Neck: no adenopathy, supple, symmetrical, trachea midline and thyroid normal to inspection and palpation Lungs:  clear to auscultation bilaterally Breasts: normal appearance, no masses or tenderness on right, left breast is surgically absent with well healed scars and no LAD noted, no masses noted as well Heart: regular rate and rhythm Abdomen: soft, non-tender; bowel sounds normal; no masses,  no organomegaly Extremities: extremities normal, atraumatic, no cyanosis or edema Skin: Skin color, texture, turgor normal. No rashes or lesions Lymph nodes: Cervical, supraclavicular, and axillary nodes normal. No abnormal inguinal nodes palpated Neurologic: Grossly normal   Pelvic: External genitalia:  no lesions              Urethra:  normal appearing urethra with no masses, tenderness or lesions              Bartholins and Skenes: normal                 Vagina: normal appearing vagina with normal color and discharge, no lesions              Cervix: absent              Pap taken: No. Bimanual Exam:  Uterus:  uterus absent              Adnexa: no mass, fullness, tenderness               Rectovaginal: Confirms               Anus:  normal sphincter tone, no lesions  Chaperone was present for exam.  A:  Well Woman with normal exam PMP, no HRT Dementia Elevated lipids Urinary incontinence with h/o cystocele Hypothyroidism Obesity Diabetes Left breast cancer 2006 and then 2018, s/p simple mastectomy.  Negative genetic testing.    P:   Mammogram on right is being done yearly BMD will be scheduled for pt.  husband would like it the same day as her MMG so MMG appt may need to be changed.  Will call with this information.   pap smear not indicated Lab work is being done at Dr. Julianne Rice office She is also  followed by neurology and oncology Return in 1-2 years or with any new issues/ concerns

## 2018-09-08 ENCOUNTER — Other Ambulatory Visit: Payer: Self-pay | Admitting: Oncology

## 2018-09-11 ENCOUNTER — Other Ambulatory Visit: Payer: Self-pay | Admitting: Oncology

## 2018-09-21 ENCOUNTER — Ambulatory Visit: Payer: Medicare Other

## 2018-10-15 ENCOUNTER — Other Ambulatory Visit: Payer: Medicare Other

## 2018-10-15 ENCOUNTER — Ambulatory Visit: Payer: Medicare Other

## 2018-10-23 ENCOUNTER — Other Ambulatory Visit: Payer: Self-pay | Admitting: Adult Health

## 2018-11-28 ENCOUNTER — Telehealth: Payer: Self-pay

## 2018-11-28 NOTE — Telephone Encounter (Signed)
Appt change to Amy NP schedule. JEssca NP is out of office. Sarah NP is out of office.

## 2018-12-04 ENCOUNTER — Telehealth: Payer: Self-pay

## 2018-12-04 NOTE — Telephone Encounter (Signed)
Spoke with the patient and they have given verbal consent to file their insurance and to do a doxy.me visit. E-mail has been confirmed and sent.  E-mail: [email or web address]

## 2018-12-11 ENCOUNTER — Ambulatory Visit (INDEPENDENT_AMBULATORY_CARE_PROVIDER_SITE_OTHER): Payer: Medicare Other | Admitting: Family Medicine

## 2018-12-11 ENCOUNTER — Ambulatory Visit: Payer: Medicare Other | Admitting: Adult Health

## 2018-12-11 ENCOUNTER — Encounter: Payer: Self-pay | Admitting: Family Medicine

## 2018-12-11 ENCOUNTER — Other Ambulatory Visit: Payer: Self-pay

## 2018-12-11 DIAGNOSIS — F02818 Dementia in other diseases classified elsewhere, unspecified severity, with other behavioral disturbance: Secondary | ICD-10-CM

## 2018-12-11 DIAGNOSIS — F0281 Dementia in other diseases classified elsewhere with behavioral disturbance: Secondary | ICD-10-CM | POA: Diagnosis not present

## 2018-12-11 DIAGNOSIS — G301 Alzheimer's disease with late onset: Secondary | ICD-10-CM | POA: Diagnosis not present

## 2018-12-11 MED ORDER — SERTRALINE HCL 25 MG PO TABS: 25.0000 mg | ORAL_TABLET | Freq: Every day | ORAL | 2 refills | Status: DC

## 2018-12-11 NOTE — Progress Notes (Signed)
I have read the note, and I agree with the clinical assessment and plan.  Erika Hussar K Semaj Coburn   

## 2018-12-11 NOTE — Progress Notes (Addendum)
PATIENT: Kiara Nelson DOB: 1933/09/01  REASON FOR VISIT: follow up HISTORY FROM: patient  Virtual Visit via Telephone Note  I connected with Kiara Nelson on 12/11/18 at 11:00 AM EDT by telephone and verified that I am speaking with the correct person using two identifiers.   I discussed the limitations, risks, security and privacy concerns of performing an evaluation and management service by telephone and the availability of in person appointments. I also discussed with the patient that there may be a patient responsible charge related to this service. The patient expressed understanding and agreed to proceed.   History of Present Illness:  12/11/18 Kiara Nelson is a 85 y.o. female for follow up of dementia. She was seen by [name] in 05/2018 who increased Aricept dose to 23mg  daily.  Mr. Galster reports that she has tolerated the increased dose well.  He has not noticed any adverse effects.  He reports that her memory continues to fade.  She is more agitated than normal.  She has a hard time resting at night.  She is continuously requesting to go back home and live with her parents have been deceased for some time.  He reports that he has had a difficult time with her in the evenings as she is not sleeping.  She does seem to sleep much more during the day.  He has been giving her Ativan 0.5 mg twice daily to help with agitation.  She is tolerating Pristiq as well.  She is able to dress and bathe herself but requires assistance with most all other ADLs.  She does not drive.   HISTORY (copied from [place] note on 06/12/2018)  Today 06/12/18: Patient returns today for 26-month follow-up visit with history of progressive memory disturbance.  She continues on Aricept 10 mg daily at bedtime.  Husband believes that memory has declined since prior visit.  MMSE today 14/30 (prior 15/30). He is concerned regarding patient consistently asking if her mother is living (she  passed in 2011), she also has difficulty remembering if she and her husband are married, unable to state the name of her town (states she lives in [place] which was 2 years ago), along with believing she drove the car but unable to find her car while she was home. He states he has difficulty getting her to sleep therefore will give her ativan and then will have a difficult time awakening her in the morning. She also in concerned when her children are not home by dark but her children are adults and live in their own home. He denies any behavioral issues.  He does get some outside assistance by family members but he is her main caregiver.  History 12/04/2017 MM:  Ms. Kiara Nelson is an 85 year old female with a history of progressive memory disturbance.  She returns today for follow-up.  She is currently on Aricept 10 mg at bedtime.  Overall her memory has remained stable.  She continues to live at home with her husband.  She is able to complete all ADLs independently but does require some prompting.  She no longer operate a motor vehicle.  Her husband manages all the finances.  She denies any trouble sleeping.  Denies any changes with her mood. Denies hallucinations.  Her husband states that she goes to bed after midnight.  She will wake up for breakfast around 10 AM and then go back to sleep and not get up until after 2.  He states he typically has to  use Ativan at bedtime otherwise she would not go to sleep.  Patient returns today for an evaluation.  HISTORY 06/05/17:   Ms. Kiara Nelson is an 85 year old female with a history of progressive memory disorder. She returns today for follow-up. She remains on Aricept 10 mg at bedtime. She was unable to tolerate Namenda due to diarrhea. She lives at home with her husband. She is able to complete all ADLs independently but does require some prompting. Her husband reports that in the last year he has noticed that she is having trouble remembering grandchildren names.  He reports that she introduced herself to her son-in-law recently. She reports good appetite. Denies any trouble sleeping. Husband reports that she occasionally will sleep through the day if he does not wake her up. He reports that he does give her half a tablet of Ativan in the evenings due to agitation. He denies any hallucinations. Patient returns today for follow-up.  Observations/Objective:  Generalized: Well developed, in no acute distress  Mentation: Alert oriented to person only. Follows simple commands speech and language fluent   Assessment and Plan:  85 y.o. year old female  has a past medical history of Anxiety, Arthritis, Back pain, chronic, Breast cancer ([place]), Chronic low back pain (04/30/2014), Complex endometrial hyperplasia (2004), Dementia ([place]), Depression (04/30/2014), Diabetes mellitus without complication ([place]), Genetic testing (03/24/2017), Headache(784.0) (10/29/2013), Hypercholesteremia, Hypertension, Memory loss, Obese, OSA on CPAP, Personal history of radiation therapy, Pneumonia, and Recurrent cystitis. with    ICD-10-CM   1. Late onset Alzheimer's disease with behavioral disturbance (HCC) G30.1 sertraline (ZOLOFT) 25 MG tablet   F02.81    Unfortunately I am Lib continues to have a decline in her memory.  She is tolerating Aricept 23 mg daily.  We will continue this medication. [name] states that his primary concern is her restlessness at night. We will start sertraline 25 mg in the evening with dinner.  I have encouraged him to only use Ativan if absolutely necessary.  We will follow-up in 6 weeks.  At that time if she is tolerating sertraline well we may consider adding Namenda to her regimen as well.  [name] agreement with this plan.  6-week follow-up has been scheduled.  He was instructed to call sooner with any new concerns or symptoms.  No orders of the defined types were placed in this encounter.   Meds ordered this encounter  Medications   . sertraline (ZOLOFT) 25 MG tablet    Sig: Take 1 tablet (25 mg total) by mouth daily.    Dispense:  30 tablet    Refill:  2    Order Specific Question:   Supervising Provider    Answer:   Melvenia Beam [id number]     Follow Up Instructions:  I discussed the assessment and treatment plan with the patient. The patient was provided an opportunity to ask questions and all were answered. The patient agreed with the plan and demonstrated an understanding of the instructions.   The patient was advised to call back or seek an in-person evaluation if the symptoms worsen or if the condition fails to improve as anticipated.  I provided 20 minutes of non-face-to-face time during this encounter.  Patient and her husband who aids in history today are located at their place of residence during video conference.  Provider is located at her place of residence.  Liane Comber, RN helped to facilitate visit.   Debbora Presto, NP

## 2018-12-13 ENCOUNTER — Other Ambulatory Visit: Payer: Medicare Other

## 2018-12-13 ENCOUNTER — Ambulatory Visit: Payer: Medicare Other

## 2018-12-25 ENCOUNTER — Other Ambulatory Visit: Payer: Self-pay

## 2018-12-25 ENCOUNTER — Other Ambulatory Visit: Payer: Self-pay | Admitting: Adult Health

## 2018-12-25 MED ORDER — FENOFIBRATE 48 MG PO TABS: 48.0000 mg | ORAL_TABLET | Freq: Every evening | ORAL | 0 refills | Status: DC

## 2018-12-25 MED ORDER — DONEPEZIL HCL 23 MG PO TABS: 23.0000 mg | ORAL_TABLET | Freq: Every day | ORAL | 0 refills | Status: DC

## 2019-01-25 NOTE — Progress Notes (Signed)
PATIENT: Kiara Nelson DOB: 09-Jun-1934  REASON FOR VISIT: follow up HISTORY FROM: patient  Chief Complaint  Patient presents with  . Follow-up    2 month f/u. Husband present. Rm 2. Patient's husband stated that giving her a Lorazepam around 2-3 pm to help with her sundowners. He would like to discuss frequency of her Lorazepam.      HISTORY OF PRESENT ILLNESS: Today 02/05/19 Kiara Nelson is a 85 y.o. female here today for follow up of memory loss. We started sertraline 79m daily at bedtime at last visit for concerns of sundowning and possible depression.  Mr. JSemplereports that this medication has not helped at all.  In fact, he feels that she may be a little bit worse.  Kiara Nelson is followed closely by Dr. KMaudie Mercurywho has prescribed lorazepam as needed for agitation and anxiety.  Kiara Nelson that this medication works very well.  He is giving 1 mg in the early afternoon and another 1 mg tablet at bedtime.  He reports that she sleeps well.  She continues to be mobile and without falls.  She is able to go out to her garden and pull weeds from time to time.  She is able to do most ADLs with minimal assistance.  She does not drive.  She continues Aricept 23 mg nightly as well as Pristiq 100 mg daily.  She has tried Namenda in the past with significant side effect of diarrhea.  HISTORY: (copied from my note on 12/11/2018)  Kiara ASHMOREis a 85y.o. female for follow up of dementia. She was seen by Kiara Billowin 05/2018 who increased Aricept dose to 264mdaily.  Kiara Nelson that she has tolerated the increased dose well.  He has not noticed any adverse effects.  He reports that her memory continues to fade.  She is more agitated than normal.  She has a hard time resting at night.  She is continuously requesting to go back home and live with her parents have been deceased for some time.  He reports that he has had a difficult time with her in the evenings as she is not sleeping.  She  does seem to sleep much more during the day.  He has been giving her Ativan 0.5 mg twice daily to help with agitation.  She is tolerating Pristiq as well.  She is able to dress and bathe herself but requires assistance with most all other ADLs.  She does not drive.   HISTORY (copied from JePerrin Nelson on 06/12/2018)  Kiara Nelson: Patient returns today for 6-64-monthllow-up visit with history of progressive memory disturbance. She continues on Aricept 10 mg daily at bedtime. Husband believes that memory has declined since prior visit. MMSE today 14/30 (prior 15/30).He is concerned regarding patient consistently asking if her mother is living (she passed in 2011), she also has difficulty remembering if she and her husband are married, unable to state the name of her town [name]), along with believing she drove the car but unable to find her car while she was home. He states he has difficulty getting her to sleep therefore will give her ativan and then will have a difficult time awakening her in the morning. She also in concerned when her children are not home by dark but her children are adults and live in their own home. He denies any behavioral issues.He does get some outside assistance by  family members but he is her main caregiver.  History 5/20/2019MM:  Kiara Nelson is an 85 year old female with a history of progressive memory disturbance. She returns today for follow-up. She is currently on Aricept 10 mg at bedtime. Overall her memory has remained stable. She continues to live at home with her husband. She is able to complete all ADLs independently but does require some prompting. She no longer operate a motor vehicle. Her husband manages all the finances. She denies any trouble sleeping. Denies any changes with her mood. Denies hallucinations. Her husband states that she goes to bed after midnight. She will wake up for  breakfast around 10 AM and then go back to sleep and not get up until after 2. He states he typically has to use Ativan at bedtime otherwise she would not go to sleep. Patient returns today for an evaluation.  HISTORY 06/05/17:   Kiara Nelson is an 85 year old female with a history of progressive memory disorder. She returns today for follow-up. She remains on Aricept 10 mg at bedtime. She was unable to tolerate Namenda due to diarrhea. She lives at home with her husband. She is able to complete all ADLs independently but does require some prompting. Her husband reports that in the last year he has noticed that she is having trouble remembering grandchildren names. He reports that she introduced herself to her son-in-law recently. She reports good appetite. Denies any trouble sleeping. Husband reports that she occasionally will sleep through the day if he does not wake her up. He reports that he does give her half a tablet of Ativan in the evenings due to agitation. He denies any hallucinations. Patient returns today for follow-up.  REVIEW OF SYSTEMS: Out of a complete 14 system review of symptoms, the patient complains only of the following symptoms, memory loss, headache and all other reviewed systems are negative.  ALLERGIES: Allergies  Allergen Reactions  . Bee Venom Swelling  . Penicillins Swelling    Swelling of hands Has patient had a PCN reaction causing immediate rash, facial/tongue/throat swelling, SOB or lightheadedness with hypotension: Yes Has patient had a PCN reaction causing severe rash involving mucus membranes or skin necrosis: No Has patient had a PCN reaction that required hospitalization: No Has patient had a PCN reaction occurring within the last 10 years: No If all of the above answers are "NO", then may proceed with Cephalosporin use.   . Sulfa Antibiotics Other (See Comments)    unknown    HOME MEDICATIONS: Outpatient Medications Prior to Visit   Medication Sig Dispense Refill  . acetaminophen (TYLENOL) 325 MG tablet Take 2 tablets (650 mg total) by mouth every 6 (six) hours as needed for mild pain (or temp > 100). 30 tablet 0  . anastrozole (ARIMIDEX) 1 MG tablet TAKE 1 TABLET BY MOUTH ONCE DAILY 90 tablet 0  . baclofen (LIORESAL) 10 MG tablet Take 10 mg by mouth daily as needed for muscle spasms.     . Cholecalciferol (VITAMIN D) 2000 units CAPS Take 4,000 Units by mouth every evening.    . Cinnamon 500 MG capsule Take 500 mg by mouth daily.    . Cyanocobalamin (B-12) 2000 MCG TABS Take 2,000 mcg by mouth 3 (three) times a week.    . desvenlafaxine (PRISTIQ) 100 MG 24 hr tablet TAKE 1 TABLET BY MOUTH ONCE DAILY 90 tablet 3  . donepezil (ARICEPT) 23 MG TABS tablet Take 1 tablet (23 mg total) by mouth at bedtime. 90 tablet 0  .  fenofibrate (TRICOR) 48 MG tablet     . levothyroxine (SYNTHROID, LEVOTHROID) 100 MCG tablet Take 100 mcg by mouth daily before breakfast.    . LORazepam (ATIVAN) 1 MG tablet Take 0.5 tablets by mouth at bedtime as needed and may repeat dose one time if needed for anxiety or sleep.     Marland Kitchen losartan (COZAAR) 50 MG tablet Take 50 mg by mouth every evening.     Marland Kitchen MENTHOL-ZINC OXIDE EX Apply 1 application topically daily. Medicated body Powder    . Multiple Vitamins-Minerals (MULTIVITAMIN WITH MINERALS) tablet Take 1 tablet by mouth daily.    . Omega-3 Fatty Acids (FISH OIL) 1000 MG CAPS Take 1 capsule by mouth 2 (two) times daily.    . Pyridoxine HCl (VITAMIN B-6) 250 MG tablet Take 250 mg by mouth daily.    . traMADol (ULTRAM) 50 MG tablet Take 50 mg by mouth 2 (two) times daily as needed for moderate pain.     . Vitamins A & D (VITAMIN A & D) ointment Apply 1 application topically as needed for dry skin (rash).    . sertraline (ZOLOFT) 25 MG tablet Take 1 tablet (25 mg total) by mouth daily. 30 tablet 2   No facility-administered medications prior to visit.     PAST MEDICAL HISTORY: Past Medical History:   Diagnosis Date  . Anxiety   . Arthritis   . Back pain, chronic    lumbar up to thoracic area  . Breast cancer ([place])    left breast 2006  . Chronic low back pain 04/30/2014  . Complex endometrial hyperplasia 2004   renal insufficiency  . Dementia ([place])   . Depression 04/30/2014  . Diabetes mellitus without complication ([place])   . Genetic testing 03/24/2017   Ms. Burleson underwent genetic counseling and testing for hereditary cancer syndromes on 02/01/2017. Her results were negative for mutations in all 46 genes analyzed by [facility]'s 46-gene Common Hereditary Cancers Panel. Genes analyzed include: APC, ATM, AXIN2, BARD1, BMPR1A, BRCA1, BRCA2, BRIP1, CDH1, CDKN2A, CHEK2, CTNNA1, DICER1, EPCAM, GREM1, HOXB13, KIT, MEN1, MLH1, MSH2, MSH3, MSH6, MUTYH, NBN,   . Headache(784.0) 10/29/2013  . Hypercholesteremia   . Hypertension   . Memory loss   . Obese   . OSA on CPAP   . Personal history of radiation therapy    left breast 2006  . Pneumonia    patient's family stated "10-12 years ago"  . Recurrent cystitis     PAST SURGICAL HISTORY: Past Surgical History:  Procedure Laterality Date  . BREAST LUMPECTOMY Left    malignant 2006  . CATARACT EXTRACTION, BILATERAL  09/2012  . CYSTOSCOPY  07/03/2012   Procedure: CYSTOSCOPY;  Surgeon: Reece Packer, MD;  Location: [facility];  Service: Urology;;  . [place] OF UTERUS  2004   polyp resection  . EXAMINATION UNDER ANESTHESIA  07/03/2012   Procedure: EXAM UNDER ANESTHESIA;  Surgeon: Reece Packer, MD;  Location: [facility];  Service: Urology;  Laterality: N/A;  . FOOT NEUROMA SURGERY    . MASTECTOMY Left   . SIMPLE MASTECTOMY WITH AXILLARY SENTINEL NODE BIOPSY Left 12/22/2016   Procedure: LEFT SIMPLE MASTECTOMY;  Surgeon: Erroll Luna, MD;  Location: [facility];  Service: General;  Laterality: Left;  ANESTHESIA GENERL AND PECTORAL BLOCK  . TONSILLECTOMY  1962  . TUBAL LIGATION  1970  . VAGINAL HYSTERECTOMY  07/03/2012   Procedure:  HYSTERECTOMY VAGINAL;  Surgeon: Peri Maris, MD;  Location: [facility];  Service: Gynecology;  Laterality: N/A;    FAMILY HISTORY: Family History  Problem Relation Age of Onset  . Hypertension Mother   . Heart disease Mother   . Hypertension Father   . Dementia Father   . Depression Sister   . Breast cancer Sister        d.85  . Breast cancer Maternal Aunt 60       d.70s  . Breast cancer Maternal Grandmother 39       d.80s    SOCIAL HISTORY: Social History   Socioeconomic History  . Marital status: Married    Spouse name: Not on file  . Number of children: 3  . Years of education: college  . Highest education level: Not on file  Occupational History  . Occupation: Retired    Fish farm manager: RETIRED  Social Needs  . Financial resource strain: Not on file  . Food insecurity    Worry: Not on file    Inability: Not on file  . Transportation needs    Medical: Not on file    Non-medical: Not on file  Tobacco Use  . Smoking status: Never Smoker  . Smokeless tobacco: Never Used  Substance and Sexual Activity  . Alcohol use: Not Currently  . Drug use: No  . Sexual activity: Yes    Partners: Male    Birth control/protection: Post-menopausal, Surgical    Comment: [facility]  Lifestyle  . Physical activity    Days per week: Not on file    Minutes per session: Not on file  . Stress: Not on file  Relationships  . Social Herbalist on phone: Not on file    Gets together: Not on file    Attends religious service: Not on file    Active member of club or organization: Not on file    Attends meetings of clubs or organizations: Not on file    Relationship status: Not on file  . Intimate partner violence    Fear of current or ex partner: Not on file    Emotionally abused: Not on file    Physically abused: Not on file    Forced sexual activity: Not on file  Other Topics Concern  . Not on file  Social History Narrative   Patient lives at home with her husband Gwyndolyn Saxon).    Education college.   Retired. School Pharmacist, hospital.   Right handed.   Caffeine -None.      PHYSICAL EXAM  Vitals:   02/04/19 1024  BP: 113/71  Pulse: 69  Temp: 98 F (36.7 C)  TempSrc: Oral  Weight: 211 lb (95.7 kg)  Height: 5' 3.25" (1.607 m)   Body mass index is 37.08 kg/m.  Generalized: Well developed, in no acute distress. Neurological examination  Mentation: Alert, not oriented to time or place. Follows all commands speech and language fluent Cranial nerve II-XII: Pupils were equal round reactive to light. Extraocular movements were full, visual field were full on confrontational test. Facial sensation and strength were normal. Uvula tongue midline. Head turning and shoulder shrug  were normal and symmetric. Motor: The motor testing reveals 5 over 5 strength of all 4 extremities. Good symmetric motor tone is noted throughout.  Coordination: Cerebellar testing reveals good finger-nose-finger and heel-to-shin bilaterally.  Gait and station: Gait is normal.  DIAGNOSTIC DATA (LABS, IMAGING, TESTING) - I reviewed patient records, labs, notes, testing and imaging myself where available.  MMSE - Mini Mental State Exam 02/04/2019 06/12/2018 12/04/2017  Not completed: (No Data) (  No Data) -  Orientation to time _0 Orientation to Place _1 Registration _2 Attention/ Calculation _3 Recall 0 0 0  Language- name 2 objects _4 Language- repeat 1 0 0  Language- follow 3 step command _5 Language- follow 3 step command-comments She didnt place the paper in her lab or the table - held it her hand  Language- read & follow direction _6 Write a sentence _7 Copy design _8 Total score _9 Lab Results  Component Value Date   WBC 6.2 12/20/2016   HGB 12.6 12/20/2016   HCT 39.4 12/20/2016   MCV 95.9 12/20/2016   PLT 200 12/20/2016      Component Value Date/Time   NA 142 12/20/2016 1021   K 4.7 12/20/2016 1021   CL 107 12/20/2016 1021   CO2  26 12/20/2016 1021   GLUCOSE 122 (H) 12/20/2016 1021   BUN 29 (H) 12/20/2016 1021   CREATININE 1.54 (H) 12/20/2016 1021   CALCIUM 9.9 12/20/2016 1021   PROT 6.9 07/03/2012 0617   ALBUMIN 3.9 07/03/2012 0617   AST 18 07/03/2012 0617   ALT 9 07/03/2012 0617   ALKPHOS 58 07/03/2012 0617   BILITOT 0.4 07/03/2012 0617   GFRNONAA 30 (L) 12/20/2016 1021   GFRAA 35 (L) 12/20/2016 1021   No results found for: CHOL, HDL, LDLCALC, LDLDIRECT, TRIG, CHOLHDL Lab Results  Component Value Date   HGBA1C 5.5 12/20/2016   Lab Results  Component Value Date   VITAMINB12 603 10/29/2013   No results found for: TSH   ASSESSMENT AND PLAN 85 y.o. year old female  has a past medical history of Anxiety, Arthritis, Back pain, chronic, Breast cancer ([place]), Chronic low back pain (04/30/2014), Complex endometrial hyperplasia (2004), Dementia ([place]), Depression (04/30/2014), Diabetes mellitus without complication ([place]), Genetic testing (03/24/2017), Headache(784.0) (10/29/2013), Hypercholesteremia, Hypertension, Memory loss, Obese, OSA on CPAP, Personal history of radiation therapy, Pneumonia, and Recurrent cystitis. here with     ICD-10-CM   1. Late onset Alzheimer's disease without behavioral disturbance ([place])  G30.1    F02.80     Overall Kiara Nelson is doing well today.  Her husband does report some worsening of memory and agitation.  She did not benefit from sertraline.  We will discontinue this medication.  We will continue Aricept 23 mg and Pristiq 100 mg daily.  We have discussed several options for long-term treatment of agitation.  At this time Mr. Wadley prefers to continue using lorazepam 1 mg twice a day.  He was advised to follow-up closely with Dr. Maudie Nelson in regards to long-term use of lorazepam.  We have discussed an alternative of using Seroquel at low doses.  I would like to decrease use of lorazepam prior to starting Seroquel if possible.  Mr. Whiters was encouraged to discuss his options with Dr. Maudie Nelson as well.  I am  happy to work together to formulate a safe and effective plan.Mr. Cartelli was given educational material regarding caregiver resources.  We will follow-up in 6 months, sooner if needed. He verbalizes understanding and agreement with this plan.   No orders of the defined types were placed in this encounter.    No orders of the defined types were placed in this encounter.     I spent 15 minutes with the patient. 50% of this time was spent counseling  and educating patient on plan of care and medications.    Debbora Presto, FNP-C 02/05/2019, 8:32 AM [facility] [address], [place], [place] [address] [phone]

## 2019-02-01 ENCOUNTER — Other Ambulatory Visit: Payer: Medicare Other

## 2019-02-01 ENCOUNTER — Ambulatory Visit
Admission: RE | Admit: 2019-02-01 | Discharge: 2019-02-01 | Disposition: A | Discharge status: Home / Self Care | Payer: Medicare Other | Source: Ambulatory Visit | Attending: Internal Medicine | Admitting: Internal Medicine

## 2019-02-01 ENCOUNTER — Ambulatory Visit: Payer: Medicare Other

## 2019-02-01 ENCOUNTER — Ambulatory Visit
Admission: RE | Admit: 2019-02-01 | Discharge: 2019-02-01 | Disposition: A | Discharge status: Home / Self Care | Payer: Medicare Other | Source: Ambulatory Visit | Attending: Obstetrics & Gynecology | Admitting: Obstetrics & Gynecology

## 2019-02-01 DIAGNOSIS — Z1231 Encounter for screening mammogram for malignant neoplasm of breast: Secondary | ICD-10-CM

## 2019-02-01 DIAGNOSIS — E2839 Other primary ovarian failure: Secondary | ICD-10-CM

## 2019-02-04 ENCOUNTER — Encounter: Payer: Self-pay | Admitting: Family Medicine

## 2019-02-04 ENCOUNTER — Other Ambulatory Visit: Payer: Self-pay

## 2019-02-04 ENCOUNTER — Ambulatory Visit (INDEPENDENT_AMBULATORY_CARE_PROVIDER_SITE_OTHER): Payer: Medicare Other | Admitting: Family Medicine

## 2019-02-04 VITALS — BP 113/71 | HR 69 | Temp 98.0°F | Ht 63.25 in | Wt 211.0 lb

## 2019-02-04 DIAGNOSIS — G301 Alzheimer's disease with late onset: Secondary | ICD-10-CM | POA: Diagnosis not present

## 2019-02-04 DIAGNOSIS — F0280 Dementia in other diseases classified elsewhere without behavioral disturbance: Secondary | ICD-10-CM | POA: Diagnosis not present

## 2019-02-04 NOTE — Patient Instructions (Signed)
Continue Pristiq as prescribed  Continue Aricept as prescribed  STOP SERTRALINE  Follow up with Dr Maudie Mercury next week to discuss lorazepam dosing  Consider Seroquel, would like to reduce lorazepam dose if we chose to start Seroquel    Dementia Caregiver Guide Dementia is a term used to describe a number of symptoms that affect memory and thinking. The most common symptoms include:  Memory loss.  Trouble with language and communication.  Trouble concentrating.  Poor judgment.  Problems with reasoning.  Child-like behavior and language.  Extreme anxiety.  Angry outbursts.  Wandering from home or public places. Dementia usually gets worse slowly over time. In the early stages, people with dementia can stay independent and safe with some help. In later stages, they need help with daily tasks such as dressing, grooming, and using the bathroom. How to help the person with dementia cope Dementia can be frightening and confusing. Here are some tips to help the person with dementia cope with changes caused by the disease. General tips  Keep the person on track with his or her routine.  Try to identify areas where the person may need help.  Be supportive, patient, calm, and encouraging.  Gently remind the person that adjusting to changes takes time.  Help with the tasks that the person has asked for help with.  Keep the person involved in daily tasks and decisions as much as possible.  Encourage conversation, but try not to get frustrated or harried if the person struggles to find words or does not seem to appreciate your help. Communication tips  When the person is talking or seems frustrated, make eye contact and hold the person's hand.  Ask specific questions that need yes or no answers.  Use simple words, short sentences, and a calm voice. Only give one direction at a time.  When offering choices, limit them to just 1 or 2.  Avoid correcting the person in a negative  way.  If the person is struggling to find the right words, gently try to help him or her. How to recognize symptoms of stress Symptoms of stress in caregivers include:  Feeling frustrated or angry with the person with dementia.  Denying that the person has dementia or that his or her symptoms will not improve.  Feeling hopeless and unappreciated.  Difficulty sleeping.  Difficulty concentrating.  Feeling anxious, irritable, or depressed.  Developing stress-related health problems.  Feeling like you have too little time for your own life. Follow these instructions at home:   Make sure that you and the person you are caring for: ? Get regular sleep. ? Exercise regularly. ? Eat regular, nutritious meals. ? Drink enough fluid to keep your urine clear or pale yellow. ? Take over-the-counter and prescription medicines only as told by your health care providers. ? Attend all scheduled health care appointments.  Join a support group with others who are caregivers.  Ask about respite care resources so that you can have a regular break from the stress of caregiving.  Look for signs of stress in yourself and in the person you are caring for. If you notice signs of stress, take steps to manage it.  Consider any safety risks and take steps to avoid them.  Organize medications in a pill box for each day of the week.  Create a plan to handle any legal or financial matters. Get legal or financial advice if needed.  Keep a calendar in a central location to remind the person of appointments  or other activities. Tips for reducing the risk of injury  Keep floors clear of clutter. Remove rugs, magazine racks, and floor lamps.  Keep hallways well lit, especially at night.  Put a handrail and nonslip mat in the bathtub or shower.  Put childproof locks on cabinets that contain dangerous items, such as medicines, alcohol, guns, toxic cleaning items, sharp tools or utensils, matches, and  lighters.  Put the locks in places where the person cannot see or reach them easily. This will help ensure that the person does not wander out of the house and get lost.  Be prepared for emergencies. Keep a list of emergency phone numbers and addresses in a convenient area.  Remove car keys and lock garage doors so that the person does not try to get in the car and drive.  Have the person wear a bracelet that tracks locations and identifies the person as having memory problems. This should be worn at all times for safety. Where to find support: Many individuals and organizations offer support. These include:  Support groups for people with dementia and for caregivers.  Counselors or therapists.  Home health care services.  Adult day care centers. Where to find more information Alzheimer's Association: [employer] Contact a health care provider if:  The person's health is rapidly getting worse.  You are no longer able to care for the person.  Caring for the person is affecting your physical and emotional health.  The person threatens himself or herself, you, or anyone else. Summary  Dementia is a term used to describe a number of symptoms that affect memory and thinking.  Dementia usually gets worse slowly over time.  Take steps to reduce the person's risk of injury, and to plan for future care.  Caregivers need support, relief from caregiving, and time for their own lives. This information is not intended to replace advice given to you by your health care provider. Make sure you discuss any questions you have with your health care provider. Document Released: 06/07/2016 Document Revised: 06/16/2017 Document Reviewed: 06/07/2016 Elsevier Patient Education  2020 [employer].

## 2019-02-05 ENCOUNTER — Encounter: Payer: Self-pay | Admitting: Family Medicine

## 2019-02-05 ENCOUNTER — Telehealth: Payer: Self-pay | Admitting: *Deleted

## 2019-02-05 NOTE — Telephone Encounter (Signed)
-----   Message from Megan Salon, MD sent at 02/01/2019  5:28 PM EDT ----- Please let pt know her bone density is good and has a little osteopenia (bone thinning) but no treatment is needed for this and it can be watched.  Consider repeating in 3-5 years.

## 2019-02-05 NOTE — Progress Notes (Signed)
I have read the note, and I agree with the clinical assessment and plan.  Kassidi Elza K Tatym Schermer   

## 2019-02-05 NOTE — Telephone Encounter (Signed)
LM for pt to call back.

## 2019-02-06 ENCOUNTER — Other Ambulatory Visit: Payer: Self-pay | Admitting: Neurology

## 2019-02-12 NOTE — Telephone Encounter (Signed)
Notes recorded by Polly Cobia, CMA on 02/12/2019 at 9:23 AM EDT  Pt's husband notified. Verbalized understanding.   Report routed to PCP

## 2019-02-28 ENCOUNTER — Other Ambulatory Visit: Payer: Self-pay | Admitting: Oncology

## 2019-04-01 ENCOUNTER — Other Ambulatory Visit: Payer: Self-pay | Admitting: Oncology

## 2019-04-28 ENCOUNTER — Other Ambulatory Visit: Payer: Self-pay | Admitting: Oncology

## 2019-04-29 ENCOUNTER — Telehealth: Payer: Self-pay | Admitting: Oncology

## 2019-04-29 NOTE — Telephone Encounter (Signed)
Scheduled appt per 10/12 sch message. - left message with husband - he is aware of appt date and time

## 2019-05-05 ENCOUNTER — Other Ambulatory Visit: Payer: Self-pay | Admitting: Neurology

## 2019-05-21 NOTE — Progress Notes (Signed)
[facility]  [phone] [phone]     ID: Kiara Nelson DOB: [birth year]  MR#: [id number]  UUV#:[id number]  Patient Care Team: Jani Gravel, MD as PCP - General (Internal Medicine) Erroll Luna, MD as Consulting Physician (General Surgery) Raygen Linquist, Virgie Dad, MD as Consulting Physician (Oncology) Kathrynn Ducking, MD as Consulting Physician (Neurology) Chauncey Cruel, MD OTHER MD:  CHIEF COMPLAINT: Recurrent breast cancer (s/p left mastectomy)  CURRENT TREATMENT: Completing anastrozole treatment   INTERVAL HISTORY: Kiara Nelson returns today for follow-up of her estrogen receptor positive breast cancer accompanied by her husband. She was last seen here on 01/24/2017.  She continues on anastrozole.  She has not had any side effects from this that she is aware of.  Since her last visit, she underwent genetic testing on 02/01/2017, which revealed no pathogenic mutations.  Her most recent screening mammography with tomography was performed at [facility] on 02/01/2019 showing: breast density category B; no evidence of malignancy.  She also underwent bone density testing the same day. This showed a T-score of -1.4, which is considered osteopenic.   REVIEW OF SYSTEMS: Kiara Nelson is very forgetful and right after I did her physical exam she could not remember if I had done her physical exam.  Her husband does a very good job of filling in for her and caring for her in general.  She recently had an area of dehiscence in her left chest wall related to her bra rubbing there.  They have been using antibiotic cream and it is healing.  This is being followed closely by Dr. Brantley Stage.  Aside from these issues a detailed review of systems today was stable   BREAST CANCER HISTORY: From the original intake note:  "Kiara Nelson" has a history of remote breast cancer, status post left lumpectomy in 2006 treated with adjuvant radiation then anti-estrogens briefly, discontinued  by the patient because of sciatica concerns. I was her medical oncologist at that time. Marylene Buerger was her surgeon  More recently, on 09/13/2016 she had bilateral screening mammography with tomography at the [facility]. There were postoperative changes in the lower inner quadrant of the left breast. However in the upper central left breast was a possible mass and the patient was brought back on 09/21/2016 for unilateral left diagnostic mammography with tomography and left breast ultrasonography. The breast density was category C. At the 12:00 region of the left breast there was a 1.1 cm area of asymmetry associated with punctate calcifications. This was not palpable by exam. Ultrasound found no correlate. The left axilla was sonographically benign.  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 The invasive tumor was estrogen receptor positive at 100%, progesterone receptor positive at 20%, both with strong staining intensity, with an MIB-1 of 5%, and no HER-2 amplification, the signals ratio being 1.13 and the number per cell 1.75 area  The patient's subsequent history is as detailed below.   PAST MEDICAL HISTORY: Past Medical History:  Diagnosis Date   Anxiety    Arthritis    Back pain, chronic    lumbar up to thoracic area   Breast cancer ([place])    left breast 2006   Chronic low back pain 04/30/2014   Complex endometrial hyperplasia 2004   renal insufficiency   Dementia ([place])  Depression 04/30/2014   Diabetes mellitus without complication ([place])    Genetic testing 03/24/2017   Ms. Rea underwent genetic counseling and testing for hereditary cancer syndromes on 02/01/2017. Her results were negative for mutations in all 46 genes analyzed by [facility]'s 46-gene  Common Hereditary Cancers Panel. Genes analyzed include: APC, ATM, AXIN2, BARD1, BMPR1A, BRCA1, BRCA2, BRIP1, CDH1, CDKN2A, CHEK2, CTNNA1, DICER1, EPCAM, GREM1, HOXB13, KIT, MEN1, MLH1, MSH2, MSH3, MSH6, MUTYH, NBN,    Headache(784.0) 10/29/2013   Hypercholesteremia    Hypertension    Memory loss    Obese    OSA on CPAP    Personal history of radiation therapy    left breast 2006   Pneumonia    patient's family stated "10-12 years ago"   Recurrent cystitis     PAST SURGICAL HISTORY: Past Surgical History:  Procedure Laterality Date   BREAST LUMPECTOMY Left    malignant 2006   CATARACT EXTRACTION, BILATERAL  09/2012   CYSTOSCOPY  07/03/2012   Procedure: CYSTOSCOPY;  Surgeon: Reece Packer, MD;  Location: [facility];  Service: Urology;;   [place] OF UTERUS  2004   polyp resection   EXAMINATION UNDER ANESTHESIA  07/03/2012   Procedure: EXAM UNDER ANESTHESIA;  Surgeon: Reece Packer, MD;  Location: [facility];  Service: Urology;  Laterality: N/A;   FOOT NEUROMA SURGERY     MASTECTOMY Left    SIMPLE MASTECTOMY WITH AXILLARY SENTINEL NODE BIOPSY Left 12/22/2016   Procedure: LEFT SIMPLE MASTECTOMY;  Surgeon: Erroll Luna, MD;  Location: [facility];  Service: General;  Laterality: Left;  [place]   VAGINAL HYSTERECTOMY  07/03/2012   Procedure: HYSTERECTOMY VAGINAL;  Surgeon: Peri Maris, MD;  Location: [facility];  Service: Gynecology;  Laterality: N/A;    FAMILY HISTORY Family History  Problem Relation Age of Onset   Hypertension Mother    Heart disease Mother    Hypertension Father    Dementia Father    Depression Sister    Breast cancer Sister        d.85   Breast cancer Maternal Aunt 60       d.70s   Breast cancer Maternal Grandmother 60       d.71s  The patient's parents died from causes unrelated to cancer. The patient has one sister, no brothers. A maternal  grandmother and a maternal aunt were diagnosed with breast cancers in their 57s. There is no other history of breast cancer in the family and no ovarian cancer history to the family's knowledge    GYNECOLOGIC HISTORY:  Patient's last menstrual period was 07/18/1969 (approximate).  menarche age 85, first live birth age 50. The patient is GX P3. She never took hormone replacement. 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 Their oldest daughter lives in [place] and works as a Herbalist. Son Harrie Jeans lives in [place] and works for [employer]. Daughter Levander Campion lives in [place] and works in Engineer, technical sales. The patient has 4 grandsons and one daughter, granddaughter graduating from [employer] in nursing school this year. The patient attend a local [place]: Not in place   HEALTH MAINTENANCE: Social History   Tobacco Use   Smoking status: Never Smoker   Smokeless tobacco: Never Used  Substance  Use Topics   Alcohol use: Not Currently   Drug use: No     Colonoscopy:  PAP:  Bone density:   Allergies  Allergen Reactions   Bee Venom Swelling   Penicillins Swelling    Swelling of hands Has patient had a PCN reaction causing immediate rash, facial/tongue/throat swelling, SOB or lightheadedness with hypotension: Yes Has patient had a PCN reaction causing severe rash involving mucus membranes or skin necrosis: No Has patient had a PCN reaction that required hospitalization: No Has patient had a PCN reaction occurring within the last 10 years: No If all of the above answers are "NO", then may proceed with Cephalosporin use.    Sulfa Antibiotics Other (See Comments)    unknown    Current Outpatient Medications  Medication Sig Dispense Refill   acetaminophen (TYLENOL) 325 MG tablet Take 2  tablets (650 mg total) by mouth every 6 (six) hours as needed for mild pain (or temp > 100). 30 tablet 0   anastrozole (ARIMIDEX) 1 MG tablet TAKE 1 TABLET BY MOUTH ONCE DAILY . APPOINTMENT REQUIRED FOR FUTURE REFILLS 30 tablet 0   baclofen (LIORESAL) 10 MG tablet Take 10 mg by mouth daily as needed for muscle spasms.      Cholecalciferol (VITAMIN D) 2000 units CAPS Take 4,000 Units by mouth every evening.     Cinnamon 500 MG capsule Take 500 mg by mouth daily.     Cyanocobalamin (B-12) 2000 MCG TABS Take 2,000 mcg by mouth 3 (three) times a week.     desvenlafaxine (PRISTIQ) 100 MG 24 hr tablet Take 1 tablet by mouth once daily 90 tablet 3   donepezil (ARICEPT) 23 MG TABS tablet Take 1 tablet (23 mg total) by mouth at bedtime. 90 tablet 0   fenofibrate (TRICOR) 48 MG tablet      levothyroxine (SYNTHROID, LEVOTHROID) 100 MCG tablet Take 100 mcg by mouth daily before breakfast.     LORazepam (ATIVAN) 1 MG tablet Take 0.5 tablets by mouth at bedtime as needed and may repeat dose one time if needed for anxiety or sleep.      losartan (COZAAR) 50 MG tablet Take 50 mg by mouth every evening.      MENTHOL-ZINC OXIDE EX Apply 1 application topically daily. Medicated body Powder     Multiple Vitamins-Minerals (MULTIVITAMIN WITH MINERALS) tablet Take 1 tablet by mouth daily.     Omega-3 Fatty Acids (FISH OIL) 1000 MG CAPS Take 1 capsule by mouth 2 (two) times daily.     Pyridoxine HCl (VITAMIN B-6) 250 MG tablet Take 250 mg by mouth daily.     traMADol (ULTRAM) 50 MG tablet Take 50 mg by mouth 2 (two) times daily as needed for moderate pain.      Vitamins A & D (VITAMIN A & D) ointment Apply 1 application topically as needed for dry skin (rash).     No current facility-administered medications for this visit.     OBJECTIVE: Older white woman who appears stated age  34:   05/22/19 1207  BP: (!) 152/64  Pulse: 64  Resp: 18  Temp: 98.3 F (36.8 C)  SpO2: 100%     Body mass  index is 35.76 kg/m.    ECOG FS:1 - Symptomatic but completely ambulatory   Sclerae unicteric, EOMs intact Wearing a mask No cervical or supraclavicular adenopathy Lungs no rales or rhonchi Heart regular rate and rhythm Abd soft, obese, nontender, positive bowel sounds MSK no focal spinal tenderness, no upper  extremity lymphedema Neuro: nonfocal, well oriented, appropriate affect Breasts: The right breast is benign.  The left breast is status post mastectomy.  As discussed above there is an area of dehiscence measuring approximately 3 to 4 mm, very shallow, with a clear base.  There is no evidence of disease recurrence.  Both axillae are benign.   LAB RESULTS:  CMP     Component Value Date/Time   NA 142 12/20/2016 1021   K 4.7 12/20/2016 1021   CL 107 12/20/2016 1021   CO2 26 12/20/2016 1021   GLUCOSE 122 (H) 12/20/2016 1021   BUN 29 (H) 12/20/2016 1021   CREATININE 1.54 (H) 12/20/2016 1021   CALCIUM 9.9 12/20/2016 1021   PROT 6.9 07/03/2012 0617   ALBUMIN 3.9 07/03/2012 0617   AST 18 07/03/2012 0617   ALT 9 07/03/2012 0617   ALKPHOS 58 07/03/2012 0617   BILITOT 0.4 07/03/2012 0617   GFRNONAA 30 (L) 12/20/2016 1021   GFRAA 35 (L) 12/20/2016 1021    No results found for: TOTALPROTELP, ALBUMINELP, A1GS, A2GS, BETS, BETA2SER, GAMS, MSPIKE, SPEI  No results found for: Nils Pyle, [facility]  Lab Results  Component Value Date   WBC 6.2 12/20/2016   NEUTROABS 3.9 05/05/2012   HGB 12.6 12/20/2016   HCT 39.4 12/20/2016   MCV 95.9 12/20/2016   PLT 200 12/20/2016      Chemistry      Component Value Date/Time   NA 142 12/20/2016 1021   K 4.7 12/20/2016 1021   CL 107 12/20/2016 1021   CO2 26 12/20/2016 1021   BUN 29 (H) 12/20/2016 1021   CREATININE 1.54 (H) 12/20/2016 1021      Component Value Date/Time   CALCIUM 9.9 12/20/2016 1021   ALKPHOS 58 07/03/2012 0617   AST 18 07/03/2012 0617   ALT 9 07/03/2012 0617   BILITOT 0.4 07/03/2012 0617        Lab Results  Component Value Date   LABCA2 12 05/08/2008    No components found for: [id number]  No results for input(s): INR in the last 168 hours.  Urinalysis    Component Value Date/Time   COLORURINE YELLOW 05/05/2012 [place] 05/05/2012 1658   LABSPEC 1.011 05/05/2012 1658   PHURINE 6.0 05/05/2012 1658   GLUCOSEU NEGATIVE 05/05/2012 1658   HGBUR NEGATIVE 05/05/2012 1658   BILIRUBINUR n 04/09/2015 1109   KETONESUR NEGATIVE 05/05/2012 1658   PROTEINUR n 04/09/2015 1109   PROTEINUR NEGATIVE 05/05/2012 1658   UROBILINOGEN negative 04/09/2015 1109   UROBILINOGEN 0.2 05/05/2012 1658   NITRITE trace 04/09/2015 1109   NITRITE NEGATIVE 05/05/2012 1658   LEUKOCYTESUR moderate (2+) (A) 04/09/2015 1109     STUDIES: No results found.    ELIGIBLE FOR AVAILABLE RESEARCH PROTOCOL: no  ASSESSMENT: 85 y.o. [place] woman  (1) status post left breast lower inner quadrant lumpectomy 2006, followed by radiation and brief course of anti-estrogens  (2) status post left breast overlapping sites biopsy 09/23/2016 for a clinical mT1b N0, grade 1 invasive ductal carcinoma, estrogen and progesterone receptor positive, HER-2 not amplified, with an MIB-15%  (3) Left simple mastectomy 12/22/2016 showed low-grade ductal carcinoma in situ measuring 1.6 cm with negative margins and all 3 sentinel lymph nodes negative.  (4) letrozole started 10/13/2016; after ultrasound of the left breast 11/24/2016 documented progression she was switched to exemestane (July 2018) and subsequently to anastrozole for cost reasons.  (5)  genetic testing 02/01/2017 [facility]'s Common Hereditary Cancers Panel  showed no pathogenic  mutations. [facility]'s Common Hereditary Cancers Panel includes analysis of the following 46 genes: APC, ATM, AXIN2, BARD1, BMPR1A, BRCA1, BRCA2, BRIP1, CDH1, CDKN2A, CHEK2, CTNNA1, DICER1, EPCAM, GREM1, HOXB13, KIT, MEN1, MLH1, MSH2, MSH3, MSH6, MUTYH, NBN, NF1, NTHL1,  PALB2, PDGFRA, PMS2, POLD1, POLE, PTEN, RAD50, RAD51C, RAD51D, SDHA, SDHB, SDHC, SDHD, SMAD4, SMARCA4, STK11, TP53, TSC1, TSC2, and VHL.   PLAN: Jeani Hawking is now a little over 2 years out from definitive surgery for her breast cancer.  She was started on antiestrogens prior to the surgery and missed some appointments which is the reason she continued on antiestrogens despite the fact that mastectomy cures DCIS close to 100% of the time.  Of course the antiestrogens can be helpful in terms of lowering the risk of recurrence in the opposite breast which otherwise would be in the 1/2 %/year range.  At this point however I am comfortable with her going off antiestrogens.  I do not think they will offer her any significant benefit in terms of breast cancer MD may compromise her bone density or cause other issues.  Given her age and significant cognitive deficits I am comfortable releasing her to her primary care physician and to Dr. Josetta Huddle care.  I gave them a copy of her current mammogram.  Liver and her husband know that I will be glad to see her again at any point in the future if and when the need arises but as of now are making no further routine appointments for her here.   Chauncey Cruel, MD   05/22/2019 12:45 PM Medical Oncology and Hematology [facility] [place], [place] [address] Tel. [phone]    Fax. [phone]   I, Wilburn Mylar, am acting as scribe for Dr. Virgie Dad.  Jon.  I, Lurline Del MD, have reviewed the above documentation for accuracy and completeness, and I agree with the above.

## 2019-05-22 ENCOUNTER — Other Ambulatory Visit: Payer: Self-pay

## 2019-05-22 ENCOUNTER — Inpatient Hospital Stay: Payer: Medicare Other | Attending: Oncology | Admitting: Oncology

## 2019-05-22 VITALS — BP 152/64 | HR 64 | Temp 98.3°F | Resp 18 | Ht 63.25 in | Wt 203.5 lb

## 2019-05-22 DIAGNOSIS — Z79899 Other long term (current) drug therapy: Secondary | ICD-10-CM | POA: Diagnosis not present

## 2019-05-22 DIAGNOSIS — C50812 Malignant neoplasm of overlapping sites of left female breast: Secondary | ICD-10-CM | POA: Diagnosis not present

## 2019-05-22 DIAGNOSIS — Z853 Personal history of malignant neoplasm of breast: Secondary | ICD-10-CM | POA: Diagnosis not present

## 2019-05-22 DIAGNOSIS — Z9012 Acquired absence of left breast and nipple: Secondary | ICD-10-CM | POA: Insufficient documentation

## 2019-05-22 DIAGNOSIS — C50312 Malignant neoplasm of lower-inner quadrant of left female breast: Secondary | ICD-10-CM

## 2019-05-22 DIAGNOSIS — Z170 Estrogen receptor positive status [ER+]: Secondary | ICD-10-CM

## 2019-08-04 ENCOUNTER — Ambulatory Visit: Payer: Medicare Other | Attending: Internal Medicine

## 2019-08-04 DIAGNOSIS — Z23 Encounter for immunization: Secondary | ICD-10-CM | POA: Insufficient documentation

## 2019-08-04 NOTE — Progress Notes (Signed)
   Covid-19 Vaccination Clinic  Name:  [name]    MRN: [id number] DOB: April 02, 1934  08/04/2019  [name] was observed post Covid-19 immunization for 15 minutes without incidence. She was provided with Vaccine Information Sheet and instruction to access the V-Safe system.   [name] was instructed to call 911 with any severe reactions post vaccine: Marland Kitchen Difficulty breathing  . Swelling of your face and throat  . A fast heartbeat  . A bad rash all over your body  . Dizziness and weakness

## 2019-08-07 ENCOUNTER — Other Ambulatory Visit: Payer: Self-pay

## 2019-08-07 ENCOUNTER — Encounter: Payer: Self-pay | Admitting: Family Medicine

## 2019-08-07 ENCOUNTER — Ambulatory Visit: Payer: Medicare Other | Admitting: Family Medicine

## 2019-08-07 VITALS — BP 132/73 | HR 65 | Temp 97.0°F | Ht 63.25 in | Wt 202.2 lb

## 2019-08-07 DIAGNOSIS — F0280 Dementia in other diseases classified elsewhere without behavioral disturbance: Secondary | ICD-10-CM

## 2019-08-07 DIAGNOSIS — G301 Alzheimer's disease with late onset: Secondary | ICD-10-CM

## 2019-08-07 NOTE — Progress Notes (Signed)
I have read the note, and I agree with the clinical assessment and plan.  Justo Hengel K Dannie Woolen   

## 2019-08-07 NOTE — Patient Instructions (Signed)
We will continue current treatment regimen   Continue close follow up with Dr Maudie Mercury  Monitor calorie intake, continue activity as tolerated  Follow up in 3-6 months   Dementia Caregiver Guide Dementia is a term used to describe a number of symptoms that affect memory and thinking. The most common symptoms include:  Memory loss.  Trouble with language and communication.  Trouble concentrating.  Poor judgment.  Problems with reasoning.  Child-like behavior and language.  Extreme anxiety.  Angry outbursts.  Wandering from home or public places. Dementia usually gets worse slowly over time. In the early stages, people with dementia can stay independent and safe with some help. In later stages, they need help with daily tasks such as dressing, grooming, and using the bathroom. How to help the person with dementia cope Dementia can be frightening and confusing. Here are some tips to help the person with dementia cope with changes caused by the disease. General tips  Keep the person on track with his or her routine.  Try to identify areas where the person may need help.  Be supportive, patient, calm, and encouraging.  Gently remind the person that adjusting to changes takes time.  Help with the tasks that the person has asked for help with.  Keep the person involved in daily tasks and decisions as much as possible.  Encourage conversation, but try not to get frustrated or harried if the person struggles to find words or does not seem to appreciate your help. Communication tips  When the person is talking or seems frustrated, make eye contact and hold the person's hand.  Ask specific questions that need yes or no answers.  Use simple words, short sentences, and a calm voice. Only give one direction at a time.  When offering choices, limit them to just 1 or 2.  Avoid correcting the person in a negative way.  If the person is struggling to find the right words, gently  try to help him or her. How to recognize symptoms of stress Symptoms of stress in caregivers include:  Feeling frustrated or angry with the person with dementia.  Denying that the person has dementia or that his or her symptoms will not improve.  Feeling hopeless and unappreciated.  Difficulty sleeping.  Difficulty concentrating.  Feeling anxious, irritable, or depressed.  Developing stress-related health problems.  Feeling like you have too little time for your own life. Follow these instructions at home:   Make sure that you and the person you are caring for: ? Get regular sleep. ? Exercise regularly. ? Eat regular, nutritious meals. ? Drink enough fluid to keep your urine clear or pale yellow. ? Take over-the-counter and prescription medicines only as told by your health care providers. ? Attend all scheduled health care appointments.  Join a support group with others who are caregivers.  Ask about respite care resources so that you can have a regular break from the stress of caregiving.  Look for signs of stress in yourself and in the person you are caring for. If you notice signs of stress, take steps to manage it.  Consider any safety risks and take steps to avoid them.  Organize medications in a pill box for each day of the week.  Create a plan to handle any legal or financial matters. Get legal or financial advice if needed.  Keep a calendar in a central location to remind the person of appointments or other activities. Tips for reducing the risk of injury  Keep floors clear of clutter. Remove rugs, magazine racks, and floor lamps.  Keep hallways well lit, especially at night.  Put a handrail and nonslip mat in the bathtub or shower.  Put childproof locks on cabinets that contain dangerous items, such as medicines, alcohol, guns, toxic cleaning items, sharp tools or utensils, matches, and lighters.  Put the locks in places where the person cannot see or  reach them easily. This will help ensure that the person does not wander out of the house and get lost.  Be prepared for emergencies. Keep a list of emergency phone numbers and addresses in a convenient area.  Remove car keys and lock garage doors so that the person does not try to get in the car and drive.  Have the person wear a bracelet that tracks locations and identifies the person as having memory problems. This should be worn at all times for safety. Where to find support: Many individuals and organizations offer support. These include:  Support groups for people with dementia and for caregivers.  Counselors or therapists.  Home health care services.  Adult day care centers. Where to find more information Alzheimer's Association: [employer] Contact a health care provider if:  The person's health is rapidly getting worse.  You are no longer able to care for the person.  Caring for the person is affecting your physical and emotional health.  The person threatens himself or herself, you, or anyone else. Summary  Dementia is a term used to describe a number of symptoms that affect memory and thinking.  Dementia usually gets worse slowly over time.  Take steps to reduce the person's risk of injury, and to plan for future care.  Caregivers need support, relief from caregiving, and time for their own lives. This information is not intended to replace advice given to you by your health care provider. Make sure you discuss any questions you have with your health care provider. Document Revised: 06/16/2017 Document Reviewed: 06/07/2016 Elsevier Patient Education  2020 [employer].

## 2019-08-07 NOTE — Progress Notes (Signed)
PATIENT: Kiara Nelson DOB: August 22, 1933  REASON FOR VISIT: follow up HISTORY FROM: patient  Chief Complaint  Patient presents with  . Follow-up    6 mon f/u. Husband present. Rm 2. Patient's husband mentioned that she has been having some issue with sundownners especially on cloudy days. He stated that he is having to give her 3 tablets of her Ativan.     HISTORY OF PRESENT ILLNESS: Today 08/07/19 Kiara Nelson is a 86 y.o. female here today for follow up for dementia. She continues Aricept 35m and Pristiq. She presents today with her husband, BRush Landmark who aids in history. He reports that her short term memory has declined. She gets mixed up more often. She doesn't always recognize family. She gets more confused at night. She responds well to Ativan. He has been giving 2-3 tablets daily. No obvious adverse effects. She is able to perform ADL's independently with the exception of taking medications. BRush Landmarkgives medications and does the cooking. She has a good appetite. No trouble eating or swallowing. She has lost some weight (about 9 pounds.) Bill feels that thyroid medication is causing weight loss. He is not sure if recent dose adjustments have been made. He reports that PCP is aware and monitoring. She does continue to exercise daily but he reports that it is more difficult to get her motivated. She likes to go out on her paddle boat but weather has prevented this recently. No changes in gait. No falls.   HISTORY: (copied from my note on 02/04/2019)  Kiara BETTENHAUSENis a 86y.o. female here today for follow up of memory loss. We started sertraline 245mdaily at bedtime at last visit for concerns of sundowning and possible depression.  Mr. JoYosteports that this medication has not helped at all.  In fact, he feels that she may be a little bit worse.  Lib is followed closely by Dr. KiMaudie Mercuryho has prescribed lorazepam as needed for agitation and anxiety.  Mr. JoDevitaeports that this  medication works very well.  He is giving 1 mg in the early afternoon and another 1 mg tablet at bedtime.  He reports that she sleeps well.  She continues to be mobile and without falls.  She is able to go out to her garden and pull weeds from time to time.  She is able to do most ADLs with minimal assistance.  She does not drive.  She continues Aricept 23 mg nightly as well as Pristiq 100 mg daily.  She has tried Namenda in the past with significant side effect of diarrhea.  HISTORY: (copied from my note on 12/11/2018)  ElSHADAE Nelson [age].o.femalefor follow up of dementia.She was seen by JeJanett Billown 05/2018 who increased Aricept dose to 2380maily.Mr. JonGowports that she has tolerated the increased dose well. He has not noticed any adverse effects. He reports that her memory continues to fade. She is more agitated than normal. She has a hard time resting at night. She is continuously requesting to go back home and live with her parents have been deceased for some time. He reports that he has had a difficult time with her in the evenings as she is not sleeping. She does seem to sleep much more during the day. He has been giving her Ativan 0.5 mg twice daily to help with agitation. She is tolerating Pristiq as well. She is able to dress and bathe herself but requires assistance with most all  other ADLs. She does not drive.   HISTORY(copied from Perrin Smack note on 06/12/2018)  Today11/26/19: Patient returns today for 60-monthfollow-up visit with history of progressive memory disturbance. She continues on Aricept 10 mg daily at bedtime. Husband believes that memory has declined since prior visit. MMSE today 14/30 (prior 15/30).He is concerned regarding patient consistently asking if her mother is living (she passed in 2011), she also has difficulty remembering if she and her husband are married, unable to state the name of her town ([place]), along with believing she drove the car but unable to find her car while she was home. He states he has difficulty getting her to sleep therefore will give her ativan and then will have a difficult time awakening her in the morning. She also in concerned when her children are not home by dark but her children are adults and live in their own home. He denies any behavioral issues.He does get some outside assistance by family members but he is her main caregiver.  History 5/20/2019MM:  Ms. JKossmanis an 86year old female with a history of progressive memory disturbance. She returns today for follow-up. She is currently on Aricept 10 mg at bedtime. Overall her memory has remained stable. She continues to live at home with her husband. She is able to complete all ADLs independently but does require some prompting. She no longer operate a motor vehicle. Her husband manages all the finances. She denies any trouble sleeping. Denies any changes with her mood. Denies hallucinations. Her husband states that she goes to bed after midnight. She will wake up for breakfast around 10 AM and then go back to sleep and not get up until after 2. He states he typically has to use Ativan at bedtime otherwise she would not go to sleep. Patient returns today for an evaluation.  HISTORY 06/05/17:   [name] an 86year old female with a history of progressive memory disorder. She returns today for follow-up. She remains on Aricept 10 mg at bedtime. She was unable to tolerate Namenda due to diarrhea. She lives at home with her husband. She is able to complete all ADLs independently but does require some prompting. Her husband reports that in the last year he has noticed that she is having trouble remembering grandchildren names. He reports that she introduced herself to her son-in-law recently. She reports good appetite. Denies any trouble sleeping. Husband reports that she  occasionally will sleep through the day if he does not wake her up. He reports that he does give her half a tablet of Ativan in the evenings due to agitation. He denies any hallucinations. Patient returns today for follow-up.   REVIEW OF SYSTEMS: Out of a complete 14 system review of symptoms, the patient complains only of the following symptoms, memory loss and all other reviewed systems are negative.  ALLERGIES: Allergies  Allergen Reactions  . Bee Venom Swelling  . Penicillins Swelling    Swelling of hands Has patient had a PCN reaction causing immediate rash, facial/tongue/throat swelling, SOB or lightheadedness with hypotension: Yes Has patient had a PCN reaction causing severe rash involving mucus membranes or skin necrosis: No Has patient had a PCN reaction that required hospitalization: No Has patient had a PCN reaction occurring within the last 10 years: No If all of the above answers are "NO", then may proceed with Cephalosporin use.   . Sulfa Antibiotics Other (See Comments)    unknown  HOME MEDICATIONS: Outpatient Medications Prior to Visit  Medication Sig Dispense Refill  . acetaminophen (TYLENOL) 325 MG tablet Take 2 tablets (650 mg total) by mouth every 6 (six) hours as needed for mild pain (or temp > 100). 30 tablet 0  . Cholecalciferol (VITAMIN D) 2000 units CAPS Take 4,000 Units by mouth every evening.    . Cinnamon 500 MG capsule Take 500 mg by mouth daily.    . Cyanocobalamin (B-12) 2000 MCG TABS Take 2,000 mcg by mouth 3 (three) times a week.    . desvenlafaxine (PRISTIQ) 100 MG 24 hr tablet Take 1 tablet by mouth once daily 90 tablet 3  . donepezil (ARICEPT) 23 MG TABS tablet Take 1 tablet (23 mg total) by mouth at bedtime. 90 tablet 0  . fenofibrate (TRICOR) 48 MG tablet     . levothyroxine (SYNTHROID, LEVOTHROID) 100 MCG tablet Take 100 mcg by mouth daily before breakfast.    . LORazepam (ATIVAN) 1 MG tablet Take 0.5 tablets by mouth at bedtime as needed  and may repeat dose one time if needed for anxiety or sleep.     Marland Kitchen losartan (COZAAR) 50 MG tablet Take 50 mg by mouth every evening.     Marland Kitchen MENTHOL-ZINC OXIDE EX Apply 1 application topically daily. Medicated body Powder    . Multiple Vitamins-Minerals (MULTIVITAMIN WITH MINERALS) tablet Take 1 tablet by mouth daily.    . Omega-3 Fatty Acids (FISH OIL) 1000 MG CAPS Take 1 capsule by mouth 2 (two) times daily.    . Pyridoxine HCl (VITAMIN B-6) 250 MG tablet Take 250 mg by mouth daily.    . traMADol (ULTRAM) 50 MG tablet Take 50 mg by mouth 2 (two) times daily as needed for moderate pain.     . Vitamins A & D (VITAMIN A & D) ointment Apply 1 application topically as needed for dry skin (rash).    . baclofen (LIORESAL) 10 MG tablet Take 10 mg by mouth daily as needed for muscle spasms.      No facility-administered medications prior to visit.    PAST MEDICAL HISTORY: Past Medical History:  Diagnosis Date  . Anxiety   . Arthritis   . Back pain, chronic    lumbar up to thoracic area  . Breast cancer ([place])    left breast 2006  . Chronic low back pain 04/30/2014  . Complex endometrial hyperplasia 2004   renal insufficiency  . Dementia ([place])   . Depression 04/30/2014  . Diabetes mellitus without complication ([place])   . Genetic testing 03/24/2017   Ms. Vines underwent genetic counseling and testing for hereditary cancer syndromes on 02/01/2017. Her results were negative for mutations in all 46 genes analyzed by [facility]'s 46-gene Common Hereditary Cancers Panel. Genes analyzed include: APC, ATM, AXIN2, BARD1, BMPR1A, BRCA1, BRCA2, BRIP1, CDH1, CDKN2A, CHEK2, CTNNA1, DICER1, EPCAM, GREM1, HOXB13, KIT, MEN1, MLH1, MSH2, MSH3, MSH6, MUTYH, NBN,   . Headache(784.0) 10/29/2013  . Hypercholesteremia   . Hypertension   . Memory loss   . Obese   . OSA on CPAP   . Personal history of radiation therapy    left breast 2006  . Pneumonia    patient's family stated "10-12 years ago"  . Recurrent cystitis      PAST SURGICAL HISTORY: Past Surgical History:  Procedure Laterality Date  . BREAST LUMPECTOMY Left    malignant 2006  . CATARACT EXTRACTION, BILATERAL  09/2012  . CYSTOSCOPY  07/03/2012   Procedure: CYSTOSCOPY;  Surgeon: Elayne Snare  MacDiarmid, MD;  Location: [facility];  Service: Urology;;  . DILATION AND CURETTAGE OF UTERUS  2004   polyp resection  . EXAMINATION UNDER ANESTHESIA  07/03/2012   Procedure: EXAM UNDER ANESTHESIA;  Surgeon: Reece Packer, MD;  Location: [facility];  Service: Urology;  Laterality: N/A;  . FOOT NEUROMA SURGERY    . MASTECTOMY Left   . SIMPLE MASTECTOMY WITH AXILLARY SENTINEL NODE BIOPSY Left 12/22/2016   Procedure: LEFT SIMPLE MASTECTOMY;  Surgeon: Erroll Luna, MD;  Location: [facility];  Service: General;  Laterality: Left;  ANESTHESIA GENERL AND PECTORAL BLOCK  . TONSILLECTOMY  1962  . TUBAL LIGATION  1970  . VAGINAL HYSTERECTOMY  07/03/2012   Procedure: HYSTERECTOMY VAGINAL;  Surgeon: Peri Maris, MD;  Location: [facility];  Service: Gynecology;  Laterality: N/A;    FAMILY HISTORY: Family History  Problem Relation Age of Onset  . Hypertension Mother   . Heart disease Mother   . Hypertension Father   . Dementia Father   . Depression Sister   . Breast cancer Sister        d.85  . Breast cancer Maternal Aunt 60       d.70s  . Breast cancer Maternal Grandmother 47       d.80s    SOCIAL HISTORY: Social History   Socioeconomic History  . Marital status: Married    Spouse name: Not on file  . Number of children: 3  . Years of education: college  . Highest education level: Not on file  Occupational History  . Occupation: Retired    Fish farm manager: RETIRED  Tobacco Use  . Smoking status: Never Smoker  . Smokeless tobacco: Never Used  Substance and Sexual Activity  . Alcohol use: Not Currently  . Drug use: No  . Sexual activity: Yes    Partners: Male    Birth control/protection: Post-menopausal, Surgical    Comment: [facility]  Other Topics Concern  .  Not on file  Social History Narrative   Patient lives at home with her husband Gwyndolyn Saxon).   Education college.   Retired. School Pharmacist, hospital.   Right handed.   Caffeine -None.   Social Determinants of Health   Financial Resource Strain:   . Difficulty of Paying Living Expenses: Not on file  Food Insecurity:   . Worried About Charity fundraiser in the Last Year: Not on file  . Ran Out of Food in the Last Year: Not on file  Transportation Needs:   . Lack of Transportation (Medical): Not on file  . Lack of Transportation (Non-Medical): Not on file  Physical Activity:   . Days of Exercise per Week: Not on file  . Minutes of Exercise per Session: Not on file  Stress:   . Feeling of Stress : Not on file  Social Connections:   . Frequency of Communication with Friends and Family: Not on file  . Frequency of Social Gatherings with Friends and Family: Not on file  . Attends Religious Services: Not on file  . Active Member of Clubs or Organizations: Not on file  . Attends Archivist Meetings: Not on file  . Marital Status: Not on file  Intimate Partner Violence:   . Fear of Current or Ex-Partner: Not on file  . Emotionally Abused: Not on file  . Physically Abused: Not on file  . Sexually Abused: Not on file      PHYSICAL EXAM  Vitals:   08/07/19 1022  BP: 132/73  Pulse:  65  Temp: (!) 97 F (36.1 C)  TempSrc: Oral  Weight: 202 lb 3.2 oz (91.7 kg)  Height: 5' 3.25" (1.607 m)   Body mass index is 35.54 kg/m.  Generalized: Well developed, in no acute distress  Neurological examination  Mentation: Alert, not oriented to time, she is oriented to place with exception of county and some history taking. Follows all commands speech and language fluent Cranial nerve II-XII: Pupils were equal round reactive to light. Extraocular movements were full, visual field were full  Motor: The motor testing reveals 5 over 5 strength of all 4 extremities. Good symmetric motor tone is  noted throughout.  Sensory: Sensory testing is intact to soft touch on all 4 extremities. No evidence of extinction is noted.  Gait and station: Gait is stable, no assistive device  DIAGNOSTIC DATA (LABS, IMAGING, TESTING) - I reviewed patient records, labs, notes, testing and imaging myself where available.  MMSE - Mini Mental State Exam 08/07/2019 08/07/2019 02/04/2019  Not completed: (No Data) Unable to complete (No Data)  Orientation to time 0 - 2  Orientation to Place 2 - 3  Registration 0 - 3  Attention/ Calculation 0 - 1  Recall 0 - 0  Language- name 2 objects 2 - 2  Language- repeat 1 - 1  Language- follow 3 step command 3 - 2  Language- follow 3 step command-comments - - She didnt place the paper in her lab or the table  Language- read & follow direction 1 - 1  Write a sentence 1 - 1  Copy design 1 - 1  Total score 11 - 17     Lab Results  Component Value Date   WBC 6.2 12/20/2016   HGB 12.6 12/20/2016   HCT 39.4 12/20/2016   MCV 95.9 12/20/2016   PLT 200 12/20/2016      Component Value Date/Time   NA 142 12/20/2016 1021   K 4.7 12/20/2016 1021   CL 107 12/20/2016 1021   CO2 26 12/20/2016 1021   GLUCOSE 122 (H) 12/20/2016 1021   BUN 29 (H) 12/20/2016 1021   CREATININE 1.54 (H) 12/20/2016 1021   CALCIUM 9.9 12/20/2016 1021   PROT 6.9 07/03/2012 0617   ALBUMIN 3.9 07/03/2012 0617   AST 18 07/03/2012 0617   ALT 9 07/03/2012 0617   ALKPHOS 58 07/03/2012 0617   BILITOT 0.4 07/03/2012 0617   GFRNONAA 30 (L) 12/20/2016 1021   GFRAA 35 (L) 12/20/2016 1021   No results found for: CHOL, HDL, LDLCALC, LDLDIRECT, TRIG, CHOLHDL Lab Results  Component Value Date   HGBA1C 5.5 12/20/2016   Lab Results  Component Value Date   VITAMINB12 603 10/29/2013   No results found for: TSH     ASSESSMENT AND PLAN 86 y.o. year old female  has a past medical history of Anxiety, Arthritis, Back pain, chronic, Breast cancer ([place]), Chronic low back pain (04/30/2014), Complex  endometrial hyperplasia (2004), Dementia ([place]), Depression (04/30/2014), Diabetes mellitus without complication ([place]), Genetic testing (03/24/2017), Headache(784.0) (10/29/2013), Hypercholesteremia, Hypertension, Memory loss, Obese, OSA on CPAP, Personal history of radiation therapy, Pneumonia, and Recurrent cystitis. here with     ICD-10-CM   1. Late onset Alzheimer's disease without behavioral disturbance (HCC)  G30.1    F02.80     Lib has had some progression of memory loss. Her husband, Rush Landmark, feels that energy levels have declined as well. MMSE 11/30. Last score 17/30 in July. She continues to tolerate Aricept 44m and Pristiq 1030mdaily. She is  taking Ativan 64m tablets 2-3 times daily when agitation worsens. She is tolerating medications well without adverse effects. She is eating and sleeping well. We will continue to monitor weight loss. She was encouraged to continue being as active as possible. They will continue close follow up with PCP. Adequate hydration advised. Fall precautions. She has received her first COVID vaccine with plans for second dose in February. We will follow up in 3-6 months, sooner if needed. Bill verbalized understanding and agreement with this plan.    No orders of the defined types were placed in this encounter.    No orders of the defined types were placed in this encounter.     I spent 30 minutes with the patient. 50% of this time was spent counseling and educating patient on plan of care and medications.    ADebbora Presto FNP-C 08/07/2019, 11:23 AM [facility] [address] [place] [phone]

## 2019-08-12 ENCOUNTER — Telehealth: Payer: Self-pay | Admitting: Family Medicine

## 2019-08-12 MED ORDER — DONEPEZIL HCL 23 MG PO TABS: 23.0000 mg | ORAL_TABLET | Freq: Every day | ORAL | 3 refills | Status: DC

## 2019-08-12 NOTE — Telephone Encounter (Signed)
Pt is requesting a refill of donepezil (ARICEPT) 23 MG TABS tablet, to be sent to  [facility] ([facility] [place] [facility] [place]

## 2019-08-22 ENCOUNTER — Ambulatory Visit: Payer: Medicare Other | Attending: Internal Medicine

## 2019-08-22 DIAGNOSIS — Z23 Encounter for immunization: Secondary | ICD-10-CM | POA: Insufficient documentation

## 2019-08-22 NOTE — Progress Notes (Signed)
   Covid-19 Vaccination Clinic  Name:  Kiara Nelson    MRN: [id number] DOB: 14-Jun-1934  08/22/2019  [name] was observed post Covid-19 immunization for 15 minutes without incidence. She was provided with Vaccine Information Sheet and instruction to access the V-Safe system.   [name] was instructed to call 911 with any severe reactions post vaccine: Marland Kitchen Difficulty breathing  . Swelling of your face and throat  . A fast heartbeat  . A bad rash all over your body  . Dizziness and weakness    Immunizations Administered    Name Date Dose VIS Date Route   Pfizer COVID-19 Vaccine 08/22/2019  9:18 AM 0.3 mL 06/28/2019 Intramuscular   Manufacturer: [place]   Lot: [id number]   [place]: [id number]

## 2019-09-03 ENCOUNTER — Ambulatory Visit: Payer: Medicare Other | Admitting: Obstetrics & Gynecology

## 2019-09-10 ENCOUNTER — Ambulatory Visit: Payer: Medicare Other | Admitting: Obstetrics & Gynecology

## 2019-09-30 ENCOUNTER — Encounter: Payer: Self-pay | Admitting: Obstetrics & Gynecology

## 2019-09-30 ENCOUNTER — Ambulatory Visit (INDEPENDENT_AMBULATORY_CARE_PROVIDER_SITE_OTHER): Payer: Medicare Other | Admitting: Obstetrics & Gynecology

## 2019-09-30 ENCOUNTER — Other Ambulatory Visit: Payer: Self-pay

## 2019-09-30 VITALS — BP 122/78 | HR 80 | Temp 97.9°F | Resp 12 | Ht 66.0 in | Wt 204.0 lb

## 2019-09-30 DIAGNOSIS — N3942 Incontinence without sensory awareness: Secondary | ICD-10-CM

## 2019-09-30 DIAGNOSIS — Z01419 Encounter for gynecological examination (general) (routine) without abnormal findings: Secondary | ICD-10-CM | POA: Diagnosis not present

## 2019-09-30 MED ORDER — MIRABEGRON ER 25 MG PO TB24: 25.0000 mg | ORAL_TABLET | Freq: Every day | ORAL | 2 refills | Status: DC

## 2019-09-30 NOTE — Progress Notes (Signed)
86 y.o. G3P3 Married White or Caucasian female here for annual exam. Patient's husband states that patient has been having urinary incontinence.  She especially has leakage at night.  Patient cannot give history.  Spouse feels like she is not always aware she needs to void.  Denies vaginal bleeding.  Spouse still has help about 2 days a week.  They've both been vaccinated.  Patient's last menstrual period was 07/18/1969 (approximate).          Sexually active: No.  The current method of family planning is status post hysterectomy.    Exercising: Yes.    bike Smoker:  no  Health Maintenance: Pap:  10/27/11 Neg  History of abnormal Pap:  no MMG:  02/01/19 BIRADS 1 negative/density b Colonoscopy:  2010 Polyp BMD:   02/01/19 Osteopenia TDaP:  2019 Pneumonia vaccine(s):  2013 Shingrix:   No Hep C testing: n/a Screening Labs: PCP   reports that she has never smoked. She has never used smokeless tobacco. She reports previous alcohol use. She reports that she does not use drugs.  Past Medical History:  Diagnosis Date  . Anxiety   . Arthritis   . Back pain, chronic    lumbar up to thoracic area  . Breast cancer ([place])    left breast 2006  . Chronic low back pain 04/30/2014  . Complex endometrial hyperplasia 2004   renal insufficiency  . Dementia ([place])   . Depression 04/30/2014  . Diabetes mellitus without complication ([place])   . Genetic testing 03/24/2017   Ms. Styer underwent genetic counseling and testing for hereditary cancer syndromes on 02/01/2017. Her results were negative for mutations in all 46 genes analyzed by [facility]'s 46-gene Common Hereditary Cancers Panel. Genes analyzed include: APC, ATM, AXIN2, BARD1, BMPR1A, BRCA1, BRCA2, BRIP1, CDH1, CDKN2A, CHEK2, CTNNA1, DICER1, EPCAM, GREM1, HOXB13, KIT, MEN1, MLH1, MSH2, MSH3, MSH6, MUTYH, NBN,   . Hypercholesteremia   . Hypertension   . Obese   . OSA on CPAP   . Personal history of radiation therapy    left breast 2006  . Pneumonia     patient's family stated "10-12 years ago"    Past Surgical History:  Procedure Laterality Date  . BREAST LUMPECTOMY Left    malignant 2006  . CATARACT EXTRACTION, BILATERAL  09/2012  . CYSTOSCOPY  07/03/2012   Procedure: CYSTOSCOPY;  Surgeon: Reece Packer, MD;  Location: [facility];  Service: Urology;;  . [place] OF UTERUS  2004   polyp resection  . EXAMINATION UNDER ANESTHESIA  07/03/2012   Procedure: EXAM UNDER ANESTHESIA;  Surgeon: Reece Packer, MD;  Location: [facility];  Service: Urology;  Laterality: N/A;  . FOOT NEUROMA SURGERY    . MASTECTOMY Left   . SIMPLE MASTECTOMY WITH AXILLARY SENTINEL NODE BIOPSY Left 12/22/2016   Procedure: LEFT SIMPLE MASTECTOMY;  Surgeon: Erroll Luna, MD;  Location: [facility];  Service: General;  Laterality: Left;  ANESTHESIA GENERL AND PECTORAL BLOCK  . TONSILLECTOMY  1962  . TUBAL LIGATION  1970  . VAGINAL HYSTERECTOMY  07/03/2012   Procedure: HYSTERECTOMY VAGINAL;  Surgeon: Peri Maris, MD;  Location: [facility];  Service: Gynecology;  Laterality: N/A;    Current Outpatient Medications  Medication Sig Dispense Refill  . acetaminophen (TYLENOL) 325 MG tablet Take 2 tablets (650 mg total) by mouth every 6 (six) hours as needed for mild pain (or temp > 100). 30 tablet 0  . Cholecalciferol (VITAMIN D) 2000 units CAPS Take 4,000 Units by  mouth every evening.    . Cinnamon 500 MG capsule Take 500 mg by mouth daily.    . Cyanocobalamin (B-12) 2000 MCG TABS Take 2,000 mcg by mouth 3 (three) times a week.    . desvenlafaxine (PRISTIQ) 100 MG 24 hr tablet Take 1 tablet by mouth once daily 90 tablet 3  . donepezil (ARICEPT) 23 MG TABS tablet Take 1 tablet (23 mg total) by mouth at bedtime. 90 tablet 3  . fenofibrate (TRICOR) 48 MG tablet     . levothyroxine (SYNTHROID, LEVOTHROID) 100 MCG tablet Take 100 mcg by mouth daily before breakfast.    . LORazepam (ATIVAN) 1 MG tablet Take 0.5 tablets by mouth at bedtime as needed and may repeat  dose one time if needed for anxiety or sleep.     Marland Kitchen losartan (COZAAR) 50 MG tablet Take 50 mg by mouth every evening.     Marland Kitchen MENTHOL-ZINC OXIDE EX Apply 1 application topically daily. Medicated body Powder    . Multiple Vitamins-Minerals (MULTIVITAMIN WITH MINERALS) tablet Take 1 tablet by mouth daily.    . Multiple Vitamins-Minerals (PRESERVISION AREDS 2 PO) Take by mouth.    . Omega-3 Fatty Acids (FISH OIL) 1000 MG CAPS Take 1 capsule by mouth 2 (two) times daily.    . Pyridoxine HCl (VITAMIN B-6) 250 MG tablet Take 250 mg by mouth daily.    . traMADol (ULTRAM) 50 MG tablet Take 50 mg by mouth 2 (two) times daily as needed for moderate pain.     . Vitamins A & D (VITAMIN A & D) ointment Apply 1 application topically as needed for dry skin (rash).     No current facility-administered medications for this visit.    Family History  Problem Relation Age of Onset  . Hypertension Mother   . Heart disease Mother   . Hypertension Father   . Dementia Father   . Depression Sister   . Breast cancer Sister        d.85  . Breast cancer Maternal Aunt 60       d.70s  . Breast cancer Maternal Grandmother 60       d.80s    Review of Systems  All other systems reviewed and are negative.   Exam:   BP 122/78 (BP Location: Left Arm, Patient Position: Sitting, Cuff Size: Large)   Pulse 80   Temp 97.9 F (36.6 C) (Temporal)   Resp 12   Ht 5' 6"  (1.676 m)   Wt 204 lb (92.5 kg)   LMP 07/18/1969 (Approximate)   BMI 32.93 kg/m  Weight loss: -19#  Height: 5' 6"  (167.6 cm)  Ht Readings from Last 3 Encounters:  09/30/19 5' 6"  (1.676 m)  08/07/19 5' 3.25" (1.607 m)  05/22/19 5' 3.25" (1.607 m)    General appearance: alert, well groomed, appears age Head: Normocephalic, without obvious abnormality, atraumatic Neck: no adenopathy, supple, symmetrical, trachea midline and thyroid normal to inspection and palpation Lungs: clear to auscultation bilaterally Breasts: right breast without masses, skin  changes, nipple discharge, LAD; left breast s/p mastectomy with well healed scars, no LAD noted Heart: regular rate and rhythm Abdomen: soft, non-tender; bowel sounds normal; no masses,  no organomegaly Extremities: extremities normal, atraumatic, no cyanosis or edema Skin: Skin color, texture, turgor normal. No rashes or lesions Lymph nodes: Cervical, supraclavicular, and axillary nodes normal. No abnormal inguinal nodes palpated Neurologic: Grossly normal   Pelvic: External genitalia:  no lesions  Urethra:  normal appearing urethra with no masses, tenderness or lesions              Bartholins and Skenes: normal                 Vagina: normal appearing vagina with normal color and discharge, no lesions, 3rd degree cystocele with shortened vagina noted              Cervix: absent              Pap taken: No. Bimanual Exam:  Uterus:  uterus absent              Adnexa: no mass, fullness, tenderness               Rectovaginal: Confirms               Anus:  normal sphincter tone, no lesions  Chaperone, Terence Lux, CMA, was present for exam.  A:  Well Woman with normal exam PMP, no HRT Late onset Alzheimer's  H/o elevated lipids Weight loss Hypothyroidism Urinary incontinence with h/o cystocele Diabetes Left breast cancer 2006, then 2018, s/p simple mastectomy.  Negative genetic testing.  P:   Mammogram on right yearly.  UTD pap smear not indicated BMD UTD Colonoscopy no longer indicated Trial of myrbetriq 87m daily.  #30/2RF.  Hypertension risk reviewed. Urine culture pending to ensure no infection Vaccines reviewed.  Declines shingrix. return prn, does not need to come yearly unless desires

## 2019-10-02 LAB — URINE CULTURE

## 2019-10-04 ENCOUNTER — Other Ambulatory Visit: Payer: Self-pay

## 2019-10-04 MED ORDER — NITROFURANTOIN MONOHYD MACRO 100 MG PO CAPS: 100.0000 mg | ORAL_CAPSULE | Freq: Two times a day (BID) | ORAL | 0 refills | Status: AC

## 2019-10-10 ENCOUNTER — Telehealth: Payer: Self-pay | Admitting: Obstetrics & Gynecology

## 2019-10-10 NOTE — Telephone Encounter (Signed)
Patient's husband, Gwyndolyn Saxon, is calling stating that he was supposed to come pick up a urine cup for the patient to take a urine sample from the patient after having a UTI. Patient finished round of antibiotics yesterday. Patient's husband is NOT on DPR. Patient has dementia and "does not understand what is going on." Husband is wanting to have patient come in and give a sample instead of him picking up a bottle and going back home and having her void.

## 2019-10-10 NOTE — Telephone Encounter (Signed)
Spoke with patients husband, Gwyndolyn Saxon.  Patient completed Macrobid on 3/24 for UTI. Denies any current symptoms. Nurse visit scheduled for repeat UC 4/6 at 10:15am. Declines earlier appt. Advised to monitor symptoms, return call to office if increase in urinary incontinence or any new symptoms.  Instructed not to restart Myrbetriq until UC repeated.  Advised if he decides they will collect urine at home, can stop by office to pick up collection cup. Spouse verbalizes understanding and is agreeable.   Routing to provider for final review. Patient is agreeable to disposition. Will close encounter.

## 2019-10-17 ENCOUNTER — Other Ambulatory Visit: Payer: Self-pay

## 2019-10-17 ENCOUNTER — Ambulatory Visit (INDEPENDENT_AMBULATORY_CARE_PROVIDER_SITE_OTHER): Payer: Medicare Other

## 2019-10-17 VITALS — BP 112/70 | HR 68 | Temp 97.5°F | Resp 10 | Ht 66.0 in | Wt 204.0 lb

## 2019-10-17 DIAGNOSIS — R8290 Unspecified abnormal findings in urine: Secondary | ICD-10-CM

## 2019-10-17 NOTE — Telephone Encounter (Signed)
Pts husband Gwyndolyn Saxon called requesting to come today for repeat urine culture since his wife finished her abx last 34. Pt to come today at 1115 am to give urine sample for repeat culture. Pts husband agreeable.   Encounter closed.

## 2019-10-17 NOTE — Progress Notes (Signed)
Patient in office for a TOC urine culture. Patient has completed the course of antibiotics and states that she is feeling better. Clean catch urine obtained and given to the lab to be resulted. Advised patient's husband that we would give them a call with results once they are ready. Patient and patient's husband verbalize understanding and agreeable.  Routing to provider and closing encounter.

## 2019-10-19 LAB — URINE CULTURE

## 2019-10-22 ENCOUNTER — Ambulatory Visit: Payer: Self-pay

## 2019-11-19 ENCOUNTER — Telehealth: Payer: Self-pay | Admitting: Obstetrics & Gynecology

## 2019-11-19 NOTE — Telephone Encounter (Signed)
Spoke with pt's husband Gwyndolyn Saxon per [employer]. Gave recommendations per Dr Sabra Heck. Husband agreeable and verbalized understanding and will get new RF from pharmacy.   Routing to Dr Sabra Heck for review. Encounter closed.

## 2019-11-19 NOTE — Telephone Encounter (Signed)
Patient's  spouse Rush Landmark called to report his wife's vitals 130/68 patient blood pressure and pulse 67. She asked if she should continue with the Mybrbetriq? No dpr on file to talk with spouse.

## 2019-11-19 NOTE — Telephone Encounter (Signed)
As blood pressure is normal, yes it is ok to continue the next month and see if this helps further.  Thanks.

## 2019-11-19 NOTE — Telephone Encounter (Signed)
Started on Myrbetriq on 09/30/19 for urinary incontinence and h/o cystocele.  AEX 09/30/19  Pt's husband Gwyndolyn Saxon calling today to give update on pt's vitals. 130/68 BP and 67 pulse on home BP monitor.  Pt's husband reports has taken for 30 days and states has seen some improvement over night with overactive bladder , but not during day since she is not getting to toilet in time. States waits for 3-4 hours before going to void and not making it in time.   Husband asking if she should continue next 30 days with new RF?   Advised will review with Dr Sabra Heck and return call to pt. Pt agreeable.   Routing to Dr Sabra Heck for review and recommendations.

## 2019-11-27 ENCOUNTER — Other Ambulatory Visit: Payer: Self-pay

## 2019-11-27 ENCOUNTER — Ambulatory Visit: Payer: Medicare Other | Admitting: Neurology

## 2019-11-27 ENCOUNTER — Encounter: Payer: Self-pay | Admitting: Neurology

## 2019-11-27 VITALS — BP 130/60 | HR 80 | Temp 96.8°F | Ht 66.0 in | Wt 201.0 lb

## 2019-11-27 DIAGNOSIS — F0280 Dementia in other diseases classified elsewhere without behavioral disturbance: Secondary | ICD-10-CM | POA: Diagnosis not present

## 2019-11-27 DIAGNOSIS — G301 Alzheimer's disease with late onset: Secondary | ICD-10-CM

## 2019-11-27 MED ORDER — DESVENLAFAXINE SUCCINATE ER 50 MG PO TB24: ORAL_TABLET | ORAL | 0 refills | Status: DC

## 2019-11-27 MED ORDER — ESCITALOPRAM OXALATE 10 MG PO TABS: ORAL_TABLET | ORAL | 2 refills | Status: DC

## 2019-11-27 MED ORDER — LORAZEPAM 1 MG PO TABS
1.0000 mg | ORAL_TABLET | Freq: Three times a day (TID) | ORAL | 3 refills | Status: AC | PRN
Start: 2019-11-27 — End: ?

## 2019-11-27 NOTE — Progress Notes (Signed)
Reason for visit: Alzheimer's disease  Kiara Nelson is an 86 y.o. female  History of present illness:  Kiara Nelson is an 86 year old right-handed white female with a history of Alzheimer's disease.  The patient lives at home with her husband.  She has had some increasing problems with sundowning, she has delusional thinking that her parents are still alive and she wants to go visit them.  She may pack up her suitcase and try to go to see them.  Oftentimes, the patient may start becoming more confused and agitated around 2 PM, she does better on bright sunny days, and when she is able to go outside and do yard work which she loves to do.  The patient may be up and down at night as well, she does have some problems with urinary frequency and incontinence, she recently had a urinary tract infection.  She recently was placed on Myrbetriq.  The patient is on 1 mg tablets of Ativan taking 2 or 3 a day if needed.  This seems to be helpful for her.  If the husband can remember to dose the drug in the early afternoon, this helps the agitation later on.  The patient is otherwise doing well with her ability to ambulate, her appetite is good.  She remains on Aricept.  She comes to the office today for an evaluation.  Past Medical History:  Diagnosis Date  . Anxiety   . Arthritis   . Back pain, chronic    lumbar up to thoracic area  . Breast cancer ([place])    left breast 2006  . Chronic low back pain 04/30/2014  . Complex endometrial hyperplasia 2004   renal insufficiency  . Dementia ([place])   . Depression 04/30/2014  . Diabetes mellitus without complication ([place])   . Genetic testing 03/24/2017   Kiara Nelson underwent genetic counseling and testing for hereditary cancer syndromes on 02/01/2017. Her results were negative for mutations in all 46 genes analyzed by [facility]'s 46-gene Common Hereditary Cancers Panel. Genes analyzed include: APC, ATM, AXIN2, BARD1, BMPR1A, BRCA1, BRCA2, BRIP1, CDH1, CDKN2A, CHEK2,  CTNNA1, DICER1, EPCAM, GREM1, HOXB13, KIT, MEN1, MLH1, MSH2, MSH3, MSH6, MUTYH, NBN,   . Hypercholesteremia   . Hypertension   . Obese   . OSA on CPAP   . Personal history of radiation therapy    left breast 2006  . Pneumonia    patient's family stated "10-12 years ago"    Past Surgical History:  Procedure Laterality Date  . BREAST LUMPECTOMY Left    malignant 2006  . CATARACT EXTRACTION, BILATERAL  09/2012  . CYSTOSCOPY  07/03/2012   Procedure: CYSTOSCOPY;  Surgeon: Reece Packer, MD;  Location: [facility];  Service: Urology;;  . [place] OF UTERUS  2004   polyp resection  . EXAMINATION UNDER ANESTHESIA  07/03/2012   Procedure: EXAM UNDER ANESTHESIA;  Surgeon: Reece Packer, MD;  Location: [facility];  Service: Urology;  Laterality: N/A;  . FOOT NEUROMA SURGERY    . MASTECTOMY Left   . SIMPLE MASTECTOMY WITH AXILLARY SENTINEL NODE BIOPSY Left 12/22/2016   Procedure: LEFT SIMPLE MASTECTOMY;  Surgeon: Erroll Luna, MD;  Location: [facility];  Service: General;  Laterality: Left;  ANESTHESIA GENERL AND PECTORAL BLOCK  . TONSILLECTOMY  1962  . TUBAL LIGATION  1970  . VAGINAL HYSTERECTOMY  07/03/2012   Procedure: HYSTERECTOMY VAGINAL;  Surgeon: Peri Maris, MD;  Location: [facility];  Service: Gynecology;  Laterality: N/A;  Family History  Problem Relation Age of Onset  . Hypertension Mother   . Heart disease Mother   . Hypertension Father   . Dementia Father   . Depression Sister   . Breast cancer Sister        d.85  . Breast cancer Maternal Aunt 60       d.70s  . Breast cancer Maternal Grandmother 75       d.80s    Social history:  reports that she has never smoked. She has never used smokeless tobacco. She reports previous alcohol use. She reports that she does not use drugs.    Allergies  Allergen Reactions  . Bee Venom Swelling  . Penicillins Swelling    Swelling of hands Has patient had a PCN reaction causing immediate rash, facial/tongue/throat  swelling, SOB or lightheadedness with hypotension: Yes Has patient had a PCN reaction causing severe rash involving mucus membranes or skin necrosis: No Has patient had a PCN reaction that required hospitalization: No Has patient had a PCN reaction occurring within the last 10 years: No If all of the above answers are "NO", then may proceed with Cephalosporin use.   . Sulfa Antibiotics Other (See Comments)    unknown    Medications:  Prior to Admission medications   Medication Sig Start Date End Date Taking? Authorizing Provider  acetaminophen (TYLENOL) 325 MG tablet Take 2 tablets (650 mg total) by mouth every 6 (six) hours as needed for mild pain (or temp > 100). 12/23/16  Yes Cornett, Thomas, MD  Cholecalciferol (VITAMIN D) 2000 units CAPS Take 4,000 Units by mouth every evening.   Yes [provider]  Cinnamon 500 MG capsule Take 500 mg by mouth daily.   Yes [provider]  Cyanocobalamin (B-12) 2000 MCG TABS Take 2,000 mcg by mouth 3 (three) times a week.   Yes [provider]  desvenlafaxine (PRISTIQ) 100 MG 24 hr tablet Take 1 tablet by mouth once daily 05/06/19  Yes Lomax, Amy, NP  donepezil (ARICEPT) 23 MG TABS tablet Take 1 tablet (23 mg total) by mouth at bedtime. 08/12/19  Yes Lomax, Amy, NP  fenofibrate (TRICOR) 48 MG tablet  11/20/18  Yes [provider]  levothyroxine (SYNTHROID, LEVOTHROID) 100 MCG tablet Take 100 mcg by mouth daily before breakfast.   Yes [provider]  LORazepam (ATIVAN) 1 MG tablet Take 0.5 tablets by mouth at bedtime as needed and may repeat dose one time if needed for anxiety or sleep.  04/09/15  Yes [provider]  losartan (COZAAR) 50 MG tablet Take 50 mg by mouth every evening.    Yes [provider]  [place] 1 application topically daily. Medicated body Powder   Yes [provider]  mirabegron ER (MYRBETRIQ) 25 MG TB24 tablet Take 1 tablet (25 mg total) by mouth  daily. One po qd 09/30/19  Yes Megan Salon, MD  Multiple Vitamins-Minerals (MULTIVITAMIN WITH MINERALS) tablet Take 1 tablet by mouth daily.   Yes [provider]  Multiple Vitamins-Minerals (PRESERVISION AREDS 2 PO) Take by mouth.   Yes [provider]  Omega-3 Fatty Acids (FISH OIL) 1000 MG CAPS Take 1 capsule by mouth 2 (two) times daily.   Yes [provider]  Pyridoxine HCl (VITAMIN B-6) 250 MG tablet Take 250 mg by mouth daily.   Yes [provider]  traMADol (ULTRAM) 50 MG tablet Take 50 mg by mouth 2 (two) times daily as needed for moderate pain.  02/25/15  Yes [provider]  Vitamins A & D (VITAMIN A & D) ointment Apply 1 application topically as needed for dry skin (rash).   Yes [provider]    ROS:  Out of a complete 14 system review of symptoms, the patient complains only of the following symptoms, and all other reviewed systems are negative.  Agitation Memory problems  Blood pressure 130/60, pulse 80, temperature (!) 96.8 F (36 C), height 5' 6"  (1.676 m), weight 201 lb (91.2 kg), last menstrual period 07/18/1969.  Physical Exam  General: The patient is alert and cooperative at the time of the examination.  The patient is moderately obese.  Skin: No significant peripheral edema is noted.   Neurologic Exam  Mental status: The Mini-Mental status examination done today shows a total score 16/30.  The patient is able to name 4 four-legged animals in 1 minute.   Cranial nerves: Facial symmetry is present. Speech is normal, no aphasia or dysarthria is noted. Extraocular movements are full. Visual fields are full.  Motor: The patient has good strength in all 4 extremities.  Sensory examination: Soft touch sensation is symmetric on the face, arms, and legs.  Coordination: The patient has good finger-nose-finger and heel-to-shin bilaterally.  No significant apraxia was seen.  Gait and station: The patient has a  normal gait. Romberg is negative. No drift is seen.  Reflexes: Deep tendon reflexes are symmetric.   Assessment/Plan:  1.  Alzheimer's disease  The patient is having some problems with sundowning, she has delusional thinking.  Ativan has been helpful to control some of the agitation linked to the delusional thinking.  The patient will be reduced on the Pristiq to 50 mg daily for 2 weeks and then stop the drug, we will add Lexapro 10 mg daily for 2 weeks then go to 20 mg daily.  The husband wishes [place] to take over the prescription for the Ativan, he will call when she needs another refill.  Jill Alexanders MD 11/27/2019 12:14 PM  [facility] [address] [place], [place] [address]  Phone [phone] Fax [phone]

## 2019-12-27 ENCOUNTER — Ambulatory Visit: Payer: Medicare Other | Admitting: Obstetrics & Gynecology

## 2020-01-06 ENCOUNTER — Other Ambulatory Visit: Payer: Self-pay | Admitting: Surgery

## 2020-01-06 DIAGNOSIS — Z1231 Encounter for screening mammogram for malignant neoplasm of breast: Secondary | ICD-10-CM

## 2020-02-05 ENCOUNTER — Other Ambulatory Visit: Payer: Self-pay

## 2020-02-05 ENCOUNTER — Ambulatory Visit
Admission: RE | Admit: 2020-02-05 | Discharge: 2020-02-05 | Disposition: A | Discharge status: Home / Self Care | Payer: Medicare Other | Source: Ambulatory Visit | Attending: Surgery | Admitting: Surgery

## 2020-02-05 DIAGNOSIS — Z1231 Encounter for screening mammogram for malignant neoplasm of breast: Secondary | ICD-10-CM

## 2020-05-12 ENCOUNTER — Other Ambulatory Visit: Payer: Self-pay | Admitting: *Deleted

## 2020-05-12 ENCOUNTER — Telehealth: Payer: Self-pay | Admitting: Neurology

## 2020-05-12 MED ORDER — DONEPEZIL HCL 23 MG PO TABS: 23.0000 mg | ORAL_TABLET | Freq: Every day | ORAL | 3 refills | Status: DC

## 2020-05-12 MED ORDER — DESVENLAFAXINE SUCCINATE ER 50 MG PO TB24
ORAL_TABLET | ORAL | 0 refills | Status: DC
Start: 2020-05-12 — End: 2020-11-30

## 2020-05-12 MED ORDER — ESCITALOPRAM OXALATE 10 MG PO TABS
ORAL_TABLET | ORAL | 2 refills | Status: DC
Start: 2020-05-12 — End: 2020-06-01

## 2020-05-12 NOTE — Telephone Encounter (Signed)
I called and talk with the husband, and never picked up the Lexapro prescription and never went down to 50 mg of the Pristiq.  I will call in these prescriptions.  After 2 weeks the patient be on Lexapro only.

## 2020-05-12 NOTE — Telephone Encounter (Signed)
Pt's husband called wanting to discuss the pt's desvenlafaxine (PRISTIQ) 50 MG 24 hr tablet and her escitalopram (LEXAPRO) 10 MG tablet with RN. Please advise.

## 2020-05-12 NOTE — Telephone Encounter (Signed)
Please advise.  Patient's husband called back and stated he never picked up the 50 mg and has been giving her the 100 mg this whole time, except for the last week when she has been out of the Pristiq altogether.  Wants to know if he needs to pick up the 50 mg and taper her down as prescribed or since she has not taken it for the past week, is it okay to just start the Lexapro?  Thank you

## 2020-05-12 NOTE — Telephone Encounter (Signed)
Pt request refill desvenlafaxine (PRISTIQ) 50 MG 24 hr tablet at [facility] [address]

## 2020-05-12 NOTE — Addendum Note (Signed)
Addended by: Kathrynn Ducking on: 05/12/2020 02:40 PM   Modules accepted: Orders

## 2020-05-12 NOTE — Telephone Encounter (Signed)
Called and spoke to husband regarding prescription.  Explained on the 11/27/19 visit Dr. Jannifer Franklin prescribed Lexapro to be taken and pristiq to be tapered down and no longer to be taken.  Prescription for Lexapro was sent in to [facility] and should be good through 11/2020.  Husband had continued to give Pristiq as they still had medication.  He verbalized that he was to no longer give Kiara Nelson anymore Pristiq and to begin giving the Lexapro.  She has not taken any Pristiq for several days now.  If he had anymore questions he can call me back.    Sent to Dr. Jannifer Franklin for [place].

## 2020-06-01 ENCOUNTER — Other Ambulatory Visit: Payer: Self-pay

## 2020-06-01 ENCOUNTER — Encounter: Payer: Self-pay | Admitting: Neurology

## 2020-06-01 ENCOUNTER — Ambulatory Visit: Payer: Medicare Other | Admitting: Neurology

## 2020-06-01 VITALS — BP 126/82 | Ht 66.0 in | Wt 201.0 lb

## 2020-06-01 DIAGNOSIS — F0280 Dementia in other diseases classified elsewhere without behavioral disturbance: Secondary | ICD-10-CM | POA: Diagnosis not present

## 2020-06-01 DIAGNOSIS — G309 Alzheimer's disease, unspecified: Secondary | ICD-10-CM

## 2020-06-01 MED ORDER — ESCITALOPRAM OXALATE 20 MG PO TABS: ORAL_TABLET | ORAL | 1 refills | Status: DC

## 2020-06-01 NOTE — Progress Notes (Signed)
PATIENT: [name] DOB: 27-Oct-1933  REASON FOR VISIT: follow up HISTORY FROM: patient  HISTORY OF PRESENT ILLNESS: Today 06/01/20 Ms. Mendel is an 86 year old female with history of Alzheimer's disease. She lives at home with her husband.  She has sundowning, delusional thinking in the afternoons.  When last seen, she was switched from [place] to Lexapro. They just realized this a few weeks ago, and started the switch.  Currently taking Lexapro 10, Pristiq 50, on 11/24, will stop Pristiq, increase Lexapro to 20 mg daily.  Is prescribed Ativan, 1 mg, 3 times daily as needed, usually just midday and around supper, keeps bedtime dose just in case remains on Aricept.  Memory is overall stable.  Does her own ADLs, does sleep a lot during the day.  [place] they [place].  She has a stationary bike, she does not like to use it.  She has a Actuary who comes 2 days a week in the morning.  She enjoys gardening with her flowers, has had a few falls as result of misstep in the yard.  Her husband manages her medications.  She presents today for evaluation accompanied by her husband.  MMSE 14/30 today.  HISTORY 11/27/2019 Dr. Jannifer Franklin: Ms. Dobkins is an 86 year old right-handed white female with a history of Alzheimer's disease.  The patient lives at home with her husband.  She has had some increasing problems with sundowning, she has delusional thinking that her parents are still alive and she wants to go visit them.  She may pack up her suitcase and try to go to see them.  Oftentimes, the patient may start becoming more confused and agitated around 2 PM, she does better on bright sunny days, and when she is able to go outside and do yard work which she loves to do.  The patient may be up and down at night as well, she does have some problems with urinary frequency and incontinence, she recently had a urinary tract infection.  She recently was placed on Myrbetriq.  The  patient is on 1 mg tablets of Ativan taking 2 or 3 a day if needed.  This seems to be helpful for her.  If the husband can remember to dose the drug in the early afternoon, this helps the agitation later on.  The patient is otherwise doing well with her ability to ambulate, her appetite is good.  She remains on Aricept.  She comes to the office today for an evaluation.   REVIEW OF SYSTEMS: Out of a complete 14 system review of symptoms, the patient complains only of the following symptoms, and all other reviewed systems are negative.  Memory loss  ALLERGIES: Allergies  Allergen Reactions  . Bee Venom Swelling  . Penicillins Swelling    Swelling of hands Has patient had a PCN reaction causing immediate rash, facial/tongue/throat swelling, SOB or lightheadedness with hypotension: Yes Has patient had a PCN reaction causing severe rash involving mucus membranes or skin necrosis: No Has patient had a PCN reaction that required hospitalization: No Has patient had a PCN reaction occurring within the last 10 years: No If all of the above answers are "NO", then may proceed with Cephalosporin use.   . Sulfa Antibiotics Other (See Comments)    unknown    HOME MEDICATIONS: Outpatient Medications Prior to Visit  Medication Sig Dispense Refill  . acetaminophen (TYLENOL) 325 MG tablet Take 2 tablets (650 mg total) by mouth every 6 (  six) hours as needed for mild pain (or temp > 100). 30 tablet 0  . Cholecalciferol (VITAMIN D) 2000 units CAPS Take 4,000 Units by mouth every evening.    . Cinnamon 500 MG capsule Take 500 mg by mouth daily.    . Cyanocobalamin (B-12) 2000 MCG TABS Take 2,000 mcg by mouth 3 (three) times a week.    . desvenlafaxine (PRISTIQ) 50 MG 24 hr tablet One tablet daily for 2 weeks, then stop 15 tablet 0  . donepezil (ARICEPT) 23 MG TABS tablet Take 1 tablet (23 mg total) by mouth at bedtime. 90 tablet 3  . escitalopram (LEXAPRO) 10 MG tablet One tablet daily for 2 weeks, then  take 2 tablets daily 60 tablet 2  . fenofibrate (TRICOR) 48 MG tablet     . levothyroxine (SYNTHROID, LEVOTHROID) 100 MCG tablet Take 100 mcg by mouth daily before breakfast.    . LORazepam (ATIVAN) 1 MG tablet Take 1 tablet (1 mg total) by mouth every 8 (eight) hours as needed for anxiety. 90 tablet 3  . losartan (COZAAR) 50 MG tablet Take 50 mg by mouth every evening.     Marland Kitchen MENTHOL-ZINC OXIDE EX Apply 1 application topically daily. Medicated body Powder    . mirabegron ER (MYRBETRIQ) 25 MG TB24 tablet Take 1 tablet (25 mg total) by mouth daily. One po qd 30 tablet 2  . Multiple Vitamins-Minerals (MULTIVITAMIN WITH MINERALS) tablet Take 1 tablet by mouth daily.    . Multiple Vitamins-Minerals (PRESERVISION AREDS 2 PO) Take by mouth.    . Omega-3 Fatty Acids (FISH OIL) 1000 MG CAPS Take 1 capsule by mouth 2 (two) times daily.    . Pyridoxine HCl (VITAMIN B-6) 250 MG tablet Take 250 mg by mouth daily.    . traMADol (ULTRAM) 50 MG tablet Take 50 mg by mouth 2 (two) times daily as needed for moderate pain.     . Vitamins A & D (VITAMIN A & D) ointment Apply 1 application topically as needed for dry skin (rash).     No facility-administered medications prior to visit.    PAST MEDICAL HISTORY: Past Medical History:  Diagnosis Date  . Anxiety   . Arthritis   . Back pain, chronic    lumbar up to thoracic area  . Breast cancer ([place])    left breast 2006  . Chronic low back pain 04/30/2014  . Complex endometrial hyperplasia 2004   renal insufficiency  . Dementia ([place])   . Depression 04/30/2014  . Diabetes mellitus without complication ([place])   . Genetic testing 03/24/2017   Ms. Ruest underwent genetic counseling and testing for hereditary cancer syndromes on 02/01/2017. Her results were negative for mutations in all 46 genes analyzed by [facility]'s 46-gene Common Hereditary Cancers Panel. Genes analyzed include: APC, ATM, AXIN2, BARD1, BMPR1A, BRCA1, BRCA2, BRIP1, CDH1, CDKN2A, CHEK2, CTNNA1,  DICER1, EPCAM, GREM1, HOXB13, KIT, MEN1, MLH1, MSH2, MSH3, MSH6, MUTYH, NBN,   . Hypercholesteremia   . Hypertension   . Obese   . OSA on CPAP   . Personal history of radiation therapy    left breast 2006  . Pneumonia    patient's family stated "10-12 years ago"    PAST SURGICAL HISTORY: Past Surgical History:  Procedure Laterality Date  . BREAST LUMPECTOMY Left    malignant 2006  . CATARACT EXTRACTION, BILATERAL  09/2012  . CYSTOSCOPY  07/03/2012   Procedure: CYSTOSCOPY;  Surgeon: Reece Packer, MD;  Location: [facility];  Service: Urology;;  .  DILATION AND CURETTAGE OF UTERUS  2004   polyp resection  . EXAMINATION UNDER ANESTHESIA  07/03/2012   Procedure: EXAM UNDER ANESTHESIA;  Surgeon: Reece Packer, MD;  Location: [facility];  Service: Urology;  Laterality: N/A;  . FOOT NEUROMA SURGERY    . MASTECTOMY Left   . SIMPLE MASTECTOMY WITH AXILLARY SENTINEL NODE BIOPSY Left 12/22/2016   Procedure: LEFT SIMPLE MASTECTOMY;  Surgeon: Erroll Luna, MD;  Location: [facility];  Service: General;  Laterality: Left;  ANESTHESIA GENERL AND PECTORAL BLOCK  . TONSILLECTOMY  1962  . TUBAL LIGATION  1970  . VAGINAL HYSTERECTOMY  07/03/2012   Procedure: HYSTERECTOMY VAGINAL;  Surgeon: Peri Maris, MD;  Location: [facility];  Service: Gynecology;  Laterality: N/A;    FAMILY HISTORY: Family History  Problem Relation Age of Onset  . Hypertension Mother   . Heart disease Mother   . Hypertension Father   . Dementia Father   . Depression Sister   . Breast cancer Sister        d.85  . Breast cancer Maternal Aunt 60       d.70s  . Breast cancer Maternal Grandmother 51       d.80s    SOCIAL HISTORY: Social History   Socioeconomic History  . Marital status: Married    Spouse name: Not on file  . Number of children: 3  . Years of education: college  . Highest education level: Not on file  Occupational History  . Occupation: Retired    Fish farm manager: RETIRED  Tobacco Use  . Smoking status:  Never Smoker  . Smokeless tobacco: Never Used  Vaping Use  . Vaping Use: Never used  Substance and Sexual Activity  . Alcohol use: Not Currently  . Drug use: No  . Sexual activity: Yes    Partners: Male    Birth control/protection: Post-menopausal, Surgical    Comment: [facility]  Other Topics Concern  . Not on file  Social History Narrative   Patient lives at home with her husband Gwyndolyn Saxon).   Education college.   Retired. School Pharmacist, hospital.   Right handed.   Caffeine -None.   Social Determinants of Health   Financial Resource Strain:   . Difficulty of Paying Living Expenses: Not on file  Food Insecurity:   . Worried About Charity fundraiser in the Last Year: Not on file  . Ran Out of Food in the Last Year: Not on file  Transportation Needs:   . Lack of Transportation (Medical): Not on file  . Lack of Transportation (Non-Medical): Not on file  Physical Activity:   . Days of Exercise per Week: Not on file  . Minutes of Exercise per Session: Not on file  Stress:   . Feeling of Stress : Not on file  Social Connections:   . Frequency of Communication with Friends and Family: Not on file  . Frequency of Social Gatherings with Friends and Family: Not on file  . Attends Religious Services: Not on file  . Active Member of Clubs or Organizations: Not on file  . Attends Archivist Meetings: Not on file  . Marital Status: Not on file  Intimate Partner Violence:   . Fear of Current or Ex-Partner: Not on file  . Emotionally Abused: Not on file  . Physically Abused: Not on file  . Sexually Abused: Not on file   PHYSICAL EXAM  Vitals:   06/01/20 1047  BP: 126/82  Weight: 201 lb (91.2 kg)  Height: 5' 6"  (1.676 m)   Body mass index is 32.44 kg/m.  Generalized: Well developed, in no acute distress  MMSE - Mini Mental State Exam 06/01/2020 11/27/2019 08/07/2019  Not completed: - - (No Data)  Orientation to time 0 0 0  Orientation to Place 3 2 2   Registration 3 3 0   Attention/ Calculation 0 2 0  Attention/Calculation-comments - spell world back wards -  Recall 1 0 0  Language- name 2 objects 2 2 2   Language- repeat 0 1 1  Language- follow 3 step command 3 3 3   Language- follow 3 step command-comments - - -  Language- read & follow direction 1 1 1   Write a sentence 1 1 1   Copy design 0 1 1  Total score 14 16 11     Neurological examination  Mentation: Alert, cooperative, history is provided by her husband. Follows all commands speech and language fluent Cranial nerve II-XII: Pupils were equal round reactive to light. Extraocular movements were full, visual field were full on confrontational test. Facial sensation and strength were normal. Head turning and shoulder shrug were normal and symmetric. Motor: Good strength to all extremities Sensory: Sensory testing is intact to soft touch on all 4 extremities. No evidence of extinction is noted.  Coordination: Cerebellar testing reveals good finger-nose-finger and heel-to-shin bilaterally.  Gait and station: Gait is slightly wide-based, cautious, but steady, no assistive device Reflexes: Deep tendon reflexes are symmetric   DIAGNOSTIC DATA (LABS, IMAGING, TESTING) - I reviewed patient records, labs, notes, testing and imaging myself where available.  Lab Results  Component Value Date   WBC 6.2 12/20/2016   HGB 12.6 12/20/2016   HCT 39.4 12/20/2016   MCV 95.9 12/20/2016   PLT 200 12/20/2016      Component Value Date/Time   NA 142 12/20/2016 1021   K 4.7 12/20/2016 1021   CL 107 12/20/2016 1021   CO2 26 12/20/2016 1021   GLUCOSE 122 (H) 12/20/2016 1021   BUN 29 (H) 12/20/2016 1021   CREATININE 1.54 (H) 12/20/2016 1021   CALCIUM 9.9 12/20/2016 1021   PROT 6.9 07/03/2012 0617   ALBUMIN 3.9 07/03/2012 0617   AST 18 07/03/2012 0617   ALT 9 07/03/2012 0617   ALKPHOS 58 07/03/2012 0617   BILITOT 0.4 07/03/2012 0617   GFRNONAA 30 (L) 12/20/2016 1021   GFRAA 35 (L) 12/20/2016 1021   No  results found for: CHOL, HDL, LDLCALC, LDLDIRECT, TRIG, CHOLHDL Lab Results  Component Value Date   HGBA1C 5.5 12/20/2016   Lab Results  Component Value Date   DQQIWLNL89 211 10/29/2013   No results found for: TSH    ASSESSMENT AND PLAN 86 y.o. year old female  has a past medical history of Anxiety, Arthritis, Back pain, chronic, Breast cancer ([place]), Chronic low back pain (04/30/2014), Complex endometrial hyperplasia (2004), Dementia ([place]), Depression (04/30/2014), Diabetes mellitus without complication ([place]), Genetic testing (03/24/2017), Hypercholesteremia, Hypertension, Obese, OSA on CPAP, Personal history of radiation therapy, and Pneumonia. here with:  1.  Alzheimer's disease -MMSE 14/30 today -Continue Aricept 23 mg at bedtime -Continue switch from Pristiq to Lexapro, on 11/24, will stop Pristiq, increase Lexapro to 20 mg daily to help with sundowning/mood -Encouraged to increase activity, discussed importance of exercise -Continue Ativan 1 mg 3 times daily as needed for anxiety (refilled 05/20/20 # 60) -Follow-up in 6 months or sooner if needed  I spent 30 minutes of face-to-face and non-face-to-face time with patient.  This included previsit chart review,  lab review, study review, order entry, electronic health record documentation, patient education.  Butler Denmark, AGNP-C, DNP 06/01/2020, 11:00 AM [facility] [address], [place], [place] [address] [phone]

## 2020-06-01 NOTE — Patient Instructions (Signed)
Continue with the switch over to Lexapro, weaning off Pristiq Continue the Aricept  See you back in 6 months

## 2020-06-01 NOTE — Progress Notes (Signed)
I have read the note, and I agree with the clinical assessment and plan.  Ohm Dentler K Karey Suthers   

## 2020-06-08 ENCOUNTER — Telehealth: Payer: Self-pay | Admitting: Neurology

## 2020-06-08 NOTE — Telephone Encounter (Signed)
Spoke to husband.  Pt off Pristiq, started the lexapro 20mg  daily and has had diarrhea since [facility] Sunday and Monday. Has been sleeping a lot more too.  He went down to taking 10mg  po daily to day and will see how she does.  If any changes from SS/NP will call him back.

## 2020-06-08 NOTE — Telephone Encounter (Signed)
Pt.'s husband Gwyndolyn Saxon is on [place]. He states since changing wife's meds. to 2 tablets a day that she now has diarrhea. He states she has had 3 episodes of this & he will not give her the pill tonight. Please advise.

## 2020-10-22 ENCOUNTER — Ambulatory Visit (INDEPENDENT_AMBULATORY_CARE_PROVIDER_SITE_OTHER): Payer: Medicare Other | Admitting: Obstetrics & Gynecology

## 2020-10-22 ENCOUNTER — Other Ambulatory Visit: Payer: Self-pay

## 2020-10-22 ENCOUNTER — Encounter (HOSPITAL_BASED_OUTPATIENT_CLINIC_OR_DEPARTMENT_OTHER): Payer: Self-pay | Admitting: Obstetrics & Gynecology

## 2020-10-22 VITALS — Ht 63.5 in | Wt 201.6 lb

## 2020-10-22 DIAGNOSIS — Z780 Asymptomatic menopausal state: Secondary | ICD-10-CM

## 2020-10-22 DIAGNOSIS — Z01419 Encounter for gynecological examination (general) (routine) without abnormal findings: Secondary | ICD-10-CM | POA: Diagnosis not present

## 2020-10-22 DIAGNOSIS — G309 Alzheimer's disease, unspecified: Secondary | ICD-10-CM | POA: Diagnosis not present

## 2020-10-22 DIAGNOSIS — N3281 Overactive bladder: Secondary | ICD-10-CM | POA: Diagnosis not present

## 2020-10-22 DIAGNOSIS — F0280 Dementia in other diseases classified elsewhere without behavioral disturbance: Secondary | ICD-10-CM

## 2020-10-22 NOTE — Progress Notes (Signed)
87 y.o. G3P3 Married White or Caucasian female here for breast and pelvic exam.  I am also following her for urinary incontinence and osteopenia.  She has Alzheimer's and husband is power of attorney.  Denies vaginal bleeding.  Had mammogram last summer.  She did try myrbetriq last year.  Her husband didn't really think this help.  The last time she had more incontinence symptoms, she actually had a UTI.  Overall, bladder symptoms are stable.  Mrs Seith (spouse) with her throughout visit to help answer questions due to pt's memory changes.  Patient's last menstrual period was 07/18/1969 (approximate).          Sexually active: No.  H/O STD:  no  Health Maintenance: PCP: was Dr. Maudie Mercury.  Their appointments have rescheduled.  Appt is scheduled 11/02/2020.  Will have blood work done at that time.   Vaccines are up to date:  She needs her second Covid booster Colonoscopy:  Not indicated due to age MMG:  01/2020 BMD: 2020, -1.4 Last pap smear:  n/a.     reports that she has never smoked. She has never used smokeless tobacco. She reports previous alcohol use. She reports that she does not use drugs.  Past Medical History:  Diagnosis Date  . Anxiety   . Arthritis   . Back pain, chronic    lumbar up to thoracic area  . Breast cancer ([place])    left breast 2006  . Chronic low back pain 04/30/2014  . Complex endometrial hyperplasia 2004   renal insufficiency  . Dementia ([place])   . Depression 04/30/2014  . Diabetes mellitus without complication ([place])   . Genetic testing 03/24/2017   Ms. Consiglio underwent genetic counseling and testing for hereditary cancer syndromes on 02/01/2017. Her results were negative for mutations in all 46 genes analyzed by [facility]'s 46-gene Common Hereditary Cancers Panel. Genes analyzed include: APC, ATM, AXIN2, BARD1, BMPR1A, BRCA1, BRCA2, BRIP1, CDH1, CDKN2A, CHEK2, CTNNA1, DICER1, EPCAM, GREM1, HOXB13, KIT, MEN1, MLH1, MSH2, MSH3, MSH6, MUTYH, NBN,   . Hypercholesteremia   .  Hypertension   . Obese   . OSA on CPAP   . Personal history of radiation therapy    left breast 2006  . Pneumonia    patient's family stated "10-12 years ago"    Past Surgical History:  Procedure Laterality Date  . BREAST LUMPECTOMY Left    malignant 2006  . CATARACT EXTRACTION, BILATERAL  09/2012  . CYSTOSCOPY  07/03/2012   Procedure: CYSTOSCOPY;  Surgeon: Reece Packer, MD;  Location: [facility];  Service: Urology;;  . [place] OF UTERUS  2004   polyp resection  . EXAMINATION UNDER ANESTHESIA  07/03/2012   Procedure: EXAM UNDER ANESTHESIA;  Surgeon: Reece Packer, MD;  Location: [facility];  Service: Urology;  Laterality: N/A;  . FOOT NEUROMA SURGERY    . MASTECTOMY Left   . SIMPLE MASTECTOMY WITH AXILLARY SENTINEL NODE BIOPSY Left 12/22/2016   Procedure: LEFT SIMPLE MASTECTOMY;  Surgeon: Erroll Luna, MD;  Location: [facility];  Service: General;  Laterality: Left;  ANESTHESIA GENERL AND PECTORAL BLOCK  . TONSILLECTOMY  1962  . TUBAL LIGATION  1970  . VAGINAL HYSTERECTOMY  07/03/2012   Procedure: HYSTERECTOMY VAGINAL;  Surgeon: Peri Maris, MD;  Location: [facility];  Service: Gynecology;  Laterality: N/A;    Current Outpatient Medications  Medication Sig Dispense Refill  . acetaminophen (TYLENOL) 325 MG tablet Take 2 tablets (650 mg total) by mouth every 6 (six) hours  as needed for mild pain (or temp > 100). 30 tablet 0  . Cholecalciferol (VITAMIN D) 2000 units CAPS Take 4,000 Units by mouth every evening.    . Cinnamon 500 MG capsule Take 500 mg by mouth daily.    . Cyanocobalamin (B-12) 2000 MCG TABS Take 2,000 mcg by mouth 3 (three) times a week.    . donepezil (ARICEPT) 23 MG TABS tablet Take 1 tablet (23 mg total) by mouth at bedtime. 90 tablet 3  . escitalopram (LEXAPRO) 20 MG tablet Take 1 daily 90 tablet 1  . fenofibrate (TRICOR) 48 MG tablet     . levothyroxine (SYNTHROID, LEVOTHROID) 100 MCG tablet Take 100 mcg by mouth daily before breakfast.    .  LORazepam (ATIVAN) 1 MG tablet Take 1 tablet (1 mg total) by mouth every 8 (eight) hours as needed for anxiety. 90 tablet 3  . losartan (COZAAR) 50 MG tablet Take 50 mg by mouth every evening.     Marland Kitchen MENTHOL-ZINC OXIDE EX Apply 1 application topically daily. Medicated body Powder    . mirabegron ER (MYRBETRIQ) 25 MG TB24 tablet Take 1 tablet (25 mg total) by mouth daily. One po qd 30 tablet 2  . Multiple Vitamins-Minerals (MULTIVITAMIN WITH MINERALS) tablet Take 1 tablet by mouth daily.    . Multiple Vitamins-Minerals (PRESERVISION AREDS 2 PO) Take by mouth.    . Omega-3 Fatty Acids (FISH OIL) 1000 MG CAPS Take 1 capsule by mouth 2 (two) times daily.    . Pyridoxine HCl (VITAMIN B-6) 250 MG tablet Take 250 mg by mouth daily.    . Vitamins A & D (VITAMIN A & D) ointment Apply 1 application topically as needed for dry skin (rash).    Marland Kitchen desvenlafaxine (PRISTIQ) 50 MG 24 hr tablet One tablet daily for 2 weeks, then stop (Patient not taking: Reported on 10/22/2020) 15 tablet 0  . traMADol (ULTRAM) 50 MG tablet Take 50 mg by mouth 2 (two) times daily as needed for moderate pain.  (Patient not taking: Reported on 10/22/2020)     No current facility-administered medications for this visit.    Family History  Problem Relation Age of Onset  . Hypertension Mother   . Heart disease Mother   . Hypertension Father   . Dementia Father   . Depression Sister   . Breast cancer Sister        d.85  . Breast cancer Maternal Aunt 60       d.70s  . Breast cancer Maternal Grandmother 60       d.80s    Review of Systems  All other systems reviewed and are negative.   Exam:   Ht 5' 3.5" (1.613 m)   Wt 201 lb 9.6 oz (91.4 kg)   LMP 07/18/1969 (Approximate)   BMI 35.15 kg/m   Height: 5' 3.5" (161.3 cm)  General appearance: alert, cooperative and appears stated age Breasts: normal appearance, no masses or tenderness Abdomen: soft, non-tender; bowel sounds normal; no masses,  no organomegaly Lymph nodes:  Cervical, supraclavicular, and axillary nodes normal.  No abnormal inguinal nodes palpated Neurologic: Grossly normal  Pelvic: External genitalia:  no lesions              Urethra:  normal appearing urethra with no masses, tenderness or lesions              Bartholins and Skenes: normal                 Vagina:  normal appearing vagina with atrophic changes and no discharge, no lesions, shortened vagina              Cervix: absent              Pap taken: No. Bimanual Exam:  Uterus:  uterus absent              Adnexa: no mass, fullness, tenderness               Rectovaginal: Confirms               Anus:  normal sphincter tone, no lesions  Chaperone, Shela Nevin, CMA, was present for exam.  Assessment/Plan: 1. Encntr for gyn exam (general) (routine) w/o abn findings - pap smear not indicated due to hysterectomy - MMG 01/2020 - BMD 2020 with osteopenia - lab work will be done with provider who takes over for Dr. Maudie Mercury as he is out on medical leave - vaccines updated  2. Alzheimer disease ([place]) - followed by [facility]  3. Postmenopausal - no HRT  4. OAB (overactive bladder) - stable.  Husband does not feel needs any additional medications at this time.  Total time with pt/spouse with exam/chart review/documentation: 24 minutes

## 2020-11-30 ENCOUNTER — Encounter: Payer: Self-pay | Admitting: Neurology

## 2020-11-30 ENCOUNTER — Ambulatory Visit: Payer: Medicare Other | Admitting: Neurology

## 2020-11-30 VITALS — BP 146/70 | HR 72 | Ht 63.0 in | Wt 203.0 lb

## 2020-11-30 DIAGNOSIS — G309 Alzheimer's disease, unspecified: Secondary | ICD-10-CM

## 2020-11-30 DIAGNOSIS — F0280 Dementia in other diseases classified elsewhere without behavioral disturbance: Secondary | ICD-10-CM | POA: Diagnosis not present

## 2020-11-30 MED ORDER — ESCITALOPRAM OXALATE 10 MG PO TABS
15.0000 mg | ORAL_TABLET | Freq: Every day | ORAL | 1 refills | Status: DC
Start: 2020-11-30 — End: 2021-08-16

## 2020-11-30 MED ORDER — DONEPEZIL HCL 23 MG PO TABS: 23.0000 mg | ORAL_TABLET | Freq: Every day | ORAL | 3 refills | Status: DC

## 2020-11-30 NOTE — Progress Notes (Signed)
I have read the note, and I agree with the clinical assessment and plan.  Shalah Estelle K Jannis Atkins   

## 2020-11-30 NOTE — Progress Notes (Signed)
PATIENT: Kiara Nelson DOB: 1933/12/14  REASON FOR VISIT: follow up HISTORY FROM: patient  HISTORY OF PRESENT ILLNESS: Today 11/30/20 Kiara Nelson is a 87 year old female with history of Alzheimer's disease. Lives with her husband. Off Pristiq, on Lexapro 10 mg daily, the 20 mg resulted in diarrhea. She can do her own ADLs, needs reminding about regular toileting. More anxiety in the afternoon, on a bad day will take all 3 Ativan, on normal days tries to get by with 2. Does well on sunny days.  Will start to pack up her clothes to go back home to where her family lives. Has sitter 2 days a week, for 9-1. Is not very active, she does run errand with her husband. Is sleeping well, appetite is good. Her husband joined silver sneakers, she doesn't like to do it, only ride bike for 5 minutes. PCP filling Ativan. Here today with her husband.   Update 06/01/2020 SS: Kiara Nelson is an 87 year old female with history of Alzheimer's disease. She lives at home with her husband.  She has sundowning, delusional thinking in the afternoons.  When last seen, she was switched from [place] to Lexapro. They just realized this a few weeks ago, and started the switch.  Currently taking Lexapro 10, Pristiq 50, on 11/24, will stop Pristiq, increase Lexapro to 20 mg daily.  Is prescribed Ativan, 1 mg, 3 times daily as needed, usually just midday and around supper, keeps bedtime dose just in case remains on Aricept.  Memory is overall stable.  Does her own ADLs, does sleep a lot during the day.  [place] they [place].  She has a stationary bike, she does not like to use it.  She has a Actuary who comes 2 days a week in the morning.  She enjoys gardening with her flowers, has had a few falls as result of misstep in the yard.  Her husband manages her medications.  She presents today for evaluation accompanied by her husband.  MMSE 14/30 today.  HISTORY 11/27/2019 Dr. Jannifer Franklin: Kiara Nelson is  an 87 year old right-handed white female with a history of Alzheimer's disease.  The patient lives at home with her husband.  She has had some increasing problems with sundowning, she has delusional thinking that her parents are still alive and she wants to go visit them.  She may pack up her suitcase and try to go to see them.  Oftentimes, the patient may start becoming more confused and agitated around 2 PM, she does better on bright sunny days, and when she is able to go outside and do yard work which she loves to do.  The patient may be up and down at night as well, she does have some problems with urinary frequency and incontinence, she recently had a urinary tract infection.  She recently was placed on Myrbetriq.  The patient is on 1 mg tablets of Ativan taking 2 or 3 a day if needed.  This seems to be helpful for her.  If the husband can remember to dose the drug in the early afternoon, this helps the agitation later on.  The patient is otherwise doing well with her ability to ambulate, her appetite is good.  She remains on Aricept.  She comes to the office today for an evaluation.   REVIEW OF SYSTEMS: Out of a complete 14 system review of symptoms, the patient complains only of the following symptoms, and all other reviewed systems are  negative.  Memory loss  ALLERGIES: Allergies  Allergen Reactions  . Bee Venom Swelling  . Penicillins Swelling    Swelling of hands Has patient had a PCN reaction causing immediate rash, facial/tongue/throat swelling, SOB or lightheadedness with hypotension: Yes Has patient had a PCN reaction causing severe rash involving mucus membranes or skin necrosis: No Has patient had a PCN reaction that required hospitalization: No Has patient had a PCN reaction occurring within the last 10 years: No If all of the above answers are "NO", then may proceed with Cephalosporin use.   . Sulfa Antibiotics Other (See Comments)    unknown    HOME MEDICATIONS: Outpatient  Medications Prior to Visit  Medication Sig Dispense Refill  . acetaminophen (TYLENOL) 325 MG tablet Take 2 tablets (650 mg total) by mouth every 6 (six) hours as needed for mild pain (or temp > 100). 30 tablet 0  . Cholecalciferol (VITAMIN D) 2000 units CAPS Take 4,000 Units by mouth every evening.    . Cinnamon 500 MG capsule Take 500 mg by mouth daily.    . Cyanocobalamin (B-12) 2000 MCG TABS Take 2,000 mcg by mouth 3 (three) times a week.    . desvenlafaxine (PRISTIQ) 50 MG 24 hr tablet One tablet daily for 2 weeks, then stop 15 tablet 0  . escitalopram (LEXAPRO) 20 MG tablet Take 1 daily 90 tablet 1  . fenofibrate (TRICOR) 48 MG tablet     . levothyroxine (SYNTHROID, LEVOTHROID) 100 MCG tablet Take 100 mcg by mouth daily before breakfast.    . LORazepam (ATIVAN) 1 MG tablet Take 1 tablet (1 mg total) by mouth every 8 (eight) hours as needed for anxiety. 90 tablet 3  . losartan (COZAAR) 50 MG tablet Take 50 mg by mouth every evening.     Marland Kitchen MENTHOL-ZINC OXIDE EX Apply 1 application topically daily. Medicated body Powder    . Multiple Vitamins-Minerals (MULTIVITAMIN WITH MINERALS) tablet Take 1 tablet by mouth daily.    . Multiple Vitamins-Minerals (PRESERVISION AREDS 2 PO) Take by mouth.    . Omega-3 Fatty Acids (FISH OIL) 1000 MG CAPS Take 1 capsule by mouth 2 (two) times daily.    . Pyridoxine HCl (VITAMIN B-6) 250 MG tablet Take 250 mg by mouth daily.    . Vitamins A & D (VITAMIN A & D) ointment Apply 1 application topically as needed for dry skin (rash).    . donepezil (ARICEPT) 23 MG TABS tablet Take 1 tablet (23 mg total) by mouth at bedtime. 90 tablet 3  . traMADol (ULTRAM) 50 MG tablet Take 50 mg by mouth 2 (two) times daily as needed for moderate pain.     No facility-administered medications prior to visit.    PAST MEDICAL HISTORY: Past Medical History:  Diagnosis Date  . Anxiety   . Arthritis   . Back pain, chronic    lumbar up to thoracic area  . Breast cancer ([place])     left breast 2006  . Chronic low back pain 04/30/2014  . Complex endometrial hyperplasia 2004   renal insufficiency  . Dementia ([place])   . Depression 04/30/2014  . Diabetes mellitus without complication ([place])   . Genetic testing 03/24/2017   Ms. Martinez underwent genetic counseling and testing for hereditary cancer syndromes on 02/01/2017. Her results were negative for mutations in all 46 genes analyzed by [facility]'s 46-gene Common Hereditary Cancers Panel. Genes analyzed include: APC, ATM, AXIN2, BARD1, BMPR1A, BRCA1, BRCA2, BRIP1, CDH1, CDKN2A, CHEK2, CTNNA1, DICER1, EPCAM, GREM1,  HOXB13, KIT, MEN1, MLH1, MSH2, MSH3, MSH6, MUTYH, NBN,   . Hypercholesteremia   . Hypertension   . Obese   . OSA on CPAP   . Personal history of radiation therapy    left breast 2006  . Pneumonia    patient's family stated "10-12 years ago"    PAST SURGICAL HISTORY: Past Surgical History:  Procedure Laterality Date  . BREAST LUMPECTOMY Left    malignant 2006  . CATARACT EXTRACTION, BILATERAL  09/2012  . CYSTOSCOPY  07/03/2012   Procedure: CYSTOSCOPY;  Surgeon: Reece Packer, MD;  Location: [facility];  Service: Urology;;  . [place] OF UTERUS  2004   polyp resection  . EXAMINATION UNDER ANESTHESIA  07/03/2012   Procedure: EXAM UNDER ANESTHESIA;  Surgeon: Reece Packer, MD;  Location: [facility];  Service: Urology;  Laterality: N/A;  . FOOT NEUROMA SURGERY    . MASTECTOMY Left   . SIMPLE MASTECTOMY WITH AXILLARY SENTINEL NODE BIOPSY Left 12/22/2016   Procedure: LEFT SIMPLE MASTECTOMY;  Surgeon: Erroll Luna, MD;  Location: [facility];  Service: General;  Laterality: Left;  ANESTHESIA GENERL AND PECTORAL BLOCK  . TONSILLECTOMY  1962  . TUBAL LIGATION  1970  . VAGINAL HYSTERECTOMY  07/03/2012   Procedure: HYSTERECTOMY VAGINAL;  Surgeon: Peri Maris, MD;  Location: [facility];  Service: Gynecology;  Laterality: N/A;    FAMILY HISTORY: Family History  Problem Relation Age of Onset  .  Hypertension Mother   . Heart disease Mother   . Hypertension Father   . Dementia Father   . Depression Sister   . Breast cancer Sister        d.85  . Breast cancer Maternal Aunt 60       d.70s  . Breast cancer Maternal Grandmother 49       d.80s    SOCIAL HISTORY: Social History   Socioeconomic History  . Marital status: Married    Spouse name: Not on file  . Number of children: 3  . Years of education: college  . Highest education level: Not on file  Occupational History  . Occupation: Retired    Fish farm manager: RETIRED  Tobacco Use  . Smoking status: Never Smoker  . Smokeless tobacco: Never Used  Vaping Use  . Vaping Use: Never used  Substance and Sexual Activity  . Alcohol use: Not Currently  . Drug use: No  . Sexual activity: Yes    Partners: Male    Birth control/protection: Post-menopausal, Surgical    Comment: [facility]  Other Topics Concern  . Not on file  Social History Narrative   Patient lives at home with her husband Gwyndolyn Saxon).   Education college.   Retired. School Pharmacist, hospital.   Right handed.   Caffeine -None.   Social Determinants of Health   Financial Resource Strain: Not on file  Food Insecurity: Not on file  Transportation Needs: Not on file  Physical Activity: Not on file  Stress: Not on file  Social Connections: Not on file  Intimate Partner Violence: Not on file   PHYSICAL EXAM  Vitals:   11/30/20 1050  BP: (!) 146/70  Pulse: 72  Weight: 203 lb (92.1 kg)  Height: _0  (1.6 m)   Body mass index is 35.96 kg/m.  Generalized: Well developed, in no acute distress  MMSE - Mini Mental State Exam 06/01/2020 11/27/2019 08/07/2019  Not completed: - - (No Data)  Orientation to time 0 0 0  Orientation to Place 3 2 2  Registration 3 3 0  Attention/ Calculation 0 2 0  Attention/Calculation-comments - spell world back wards -  Recall 1 0 0  Language- name 2 objects _0 Language- repeat 0 1 1  Language- follow 3 step command _1 Language-  follow 3 step command-comments - - -  Language- read & follow direction _2 Write a sentence _3 Copy design 0 1 1  Total score _4 Neurological examination  Mentation: Alert, cooperative, history is provided by her husband. Follows all commands speech and language fluent, disoriented to place, time, DOB, alert to name, her husband's name Cranial nerve II-XII: Pupils were equal round reactive to light. Extraocular movements were full, visual field were full on confrontational test. Facial sensation and strength were normal. Head turning and shoulder shrug were normal and symmetric. Motor: Good strength to all extremities Sensory: Sensory testing is intact to soft touch on all 4 extremities. No evidence of extinction is noted.  Coordination: Cerebellar testing reveals good finger-nose-finger and heel-to-shin bilaterally.  Gait and station: Has to rock several times to get out of chair, gait is wide-based, cautious, right foot turned out Reflexes: Deep tendon reflexes are symmetric   DIAGNOSTIC DATA (LABS, IMAGING, TESTING) - I reviewed patient records, labs, notes, testing and imaging myself where available.  Lab Results  Component Value Date   WBC 6.2 12/20/2016   HGB 12.6 12/20/2016   HCT 39.4 12/20/2016   MCV 95.9 12/20/2016   PLT 200 12/20/2016      Component Value Date/Time   NA 142 12/20/2016 1021   K 4.7 12/20/2016 1021   CL 107 12/20/2016 1021   CO2 26 12/20/2016 1021   GLUCOSE 122 (H) 12/20/2016 1021   BUN 29 (H) 12/20/2016 1021   CREATININE 1.54 (H) 12/20/2016 1021   CALCIUM 9.9 12/20/2016 1021   PROT 6.9 07/03/2012 0617   ALBUMIN 3.9 07/03/2012 0617   AST 18 07/03/2012 0617   ALT 9 07/03/2012 0617   ALKPHOS 58 07/03/2012 0617   BILITOT 0.4 07/03/2012 0617   GFRNONAA 30 (L) 12/20/2016 1021   GFRAA 35 (L) 12/20/2016 1021   No results found for: CHOL, HDL, LDLCALC, LDLDIRECT, TRIG, CHOLHDL Lab Results  Component Value Date   HGBA1C 5.5 12/20/2016    Lab Results  Component Value Date   [id number] 342 10/29/2013   No results found for: TSH    ASSESSMENT AND PLAN 87 y.o. year old female  has a past medical history of Anxiety, Arthritis, Back pain, chronic, Breast cancer ([place]), Chronic low back pain (04/30/2014), Complex endometrial hyperplasia (2004), Dementia ([place]), Depression (04/30/2014), Diabetes mellitus without complication ([place]), Genetic testing (03/24/2017), Hypercholesteremia, Hypertension, Obese, OSA on CPAP, Personal history of radiation therapy, and Pneumonia. here with:  1.  Alzheimer's disease -Progressive decline, oriented to self, disoriented otherwise -Continue Aricept 23 mg at bedtime -Try Lexapro 15 mg daily for anxiety, 20 mg caused diarrhea, 10 mg not enough for anxiety -Remains on Ativan from PCP for anxiety 1 mg, 2-3 tablets daily -Follow-up in 6 months for evaluation of medication adjustment, I think in future, okay for PCP to follow, return here on an as-needed basis  I spent 30 minutes of face-to-face and non-face-to-face time with patient.  This included previsit chart review, lab review, study review, medication review, discussing side effects, continuing to follow with PCP in future.  Butler Denmark, AGNP-C, DNP 11/30/2020, 10:56 AM [facility] [id number]  [address] [place], [place] [address] [phone]

## 2020-11-30 NOTE — Patient Instructions (Signed)
Increase Lexapro to 15 mg daily to help with anxiety  Continue the Aricept  Continue to see primary care See you back in 6 months for medication check

## 2021-01-08 ENCOUNTER — Ambulatory Visit: Payer: Medicare Other

## 2021-01-22 ENCOUNTER — Other Ambulatory Visit: Payer: Self-pay | Admitting: Surgery

## 2021-01-22 DIAGNOSIS — Z1231 Encounter for screening mammogram for malignant neoplasm of breast: Secondary | ICD-10-CM

## 2021-03-17 ENCOUNTER — Ambulatory Visit
Admission: RE | Admit: 2021-03-17 | Discharge: 2021-03-17 | Disposition: A | Discharge status: Home / Self Care | Payer: Medicare Other | Source: Ambulatory Visit | Attending: Surgery | Admitting: Surgery

## 2021-03-17 ENCOUNTER — Other Ambulatory Visit: Payer: Self-pay

## 2021-03-17 DIAGNOSIS — Z1231 Encounter for screening mammogram for malignant neoplasm of breast: Secondary | ICD-10-CM

## 2021-06-07 ENCOUNTER — Telehealth: Payer: Self-pay | Admitting: Neurology

## 2021-06-07 NOTE — Telephone Encounter (Signed)
LVM & sent mychart msg informing pt of r/s needed for 11/28 appt- Judson Roch out.

## 2021-06-14 ENCOUNTER — Ambulatory Visit: Payer: Medicare Other | Admitting: Neurology

## 2021-06-17 ENCOUNTER — Telehealth: Payer: Self-pay | Admitting: Neurology

## 2021-06-17 NOTE — Telephone Encounter (Signed)
Pt's daughter was advised that Dr Jannifer Franklin has retired, she asked that pt be assigned to a MD and be seen before March, she has asked pt be checked out as a result of seeming to be more withdrawn and not her normal bubbly, lively self.  Daughter has accepted the next available appointment with Dr Leta Baptist on 12-14 with a 1:00 check in time.  The appointment with Judson Roch, NP has been cancelled

## 2021-06-30 ENCOUNTER — Ambulatory Visit: Payer: Medicare Other | Admitting: Diagnostic Neuroimaging

## 2021-06-30 ENCOUNTER — Encounter: Payer: Self-pay | Admitting: Diagnostic Neuroimaging

## 2021-06-30 ENCOUNTER — Other Ambulatory Visit: Payer: Self-pay

## 2021-06-30 VITALS — BP 131/73 | HR 69 | Ht 63.0 in | Wt 193.2 lb

## 2021-06-30 DIAGNOSIS — G301 Alzheimer's disease with late onset: Secondary | ICD-10-CM | POA: Diagnosis not present

## 2021-06-30 DIAGNOSIS — R29898 Other symptoms and signs involving the musculoskeletal system: Secondary | ICD-10-CM | POA: Diagnosis not present

## 2021-06-30 DIAGNOSIS — F02B18 Dementia in other diseases classified elsewhere, moderate, with other behavioral disturbance: Secondary | ICD-10-CM

## 2021-06-30 NOTE — Progress Notes (Addendum)
[facility]  PATIENT: Kiara Nelson DOB: Dec 05, 1933  REFERRING CLINICIAN: Deon Pilling, NP HISTORY FROM: patient, husband, daughter REASON FOR VISIT: follow up   HISTORICAL  CHIEF COMPLAINT:  Chief Complaint  Patient presents with   Alzheimers disease    Rm 6, 6 month FU husband- Engineer, technical sales, dgtr- Kiara Nelson  MMSE 11    HISTORY OF PRESENT ILLNESS:   UPDATE (06/30/21, VRP): Since last visit, doing well. Symptoms are stable. Severity is mild-moderate. No alleviating or aggravating factors. Tolerating meds. Attending day program at [facility].   PRIOR HPI: (11/27/2019 Dr. Jannifer Franklin): Kiara Nelson is an 87 year old right-handed white female with a history of Alzheimer's disease.  The patient lives at home with her husband.  She has had some increasing problems with sundowning, she has delusional thinking that her parents are still alive and she wants to go visit them.  She may pack up her suitcase and try to go to see them.  Oftentimes, the patient may start becoming more confused and agitated around 2 PM, she does better on bright sunny days, and when she is able to go outside and do yard work which she loves to do.  The patient may be up and down at night as well, she does have some problems with urinary frequency and incontinence, she recently had a urinary tract infection.  She recently was placed on Myrbetriq.  The patient is on 1 mg tablets of Ativan taking 2 or 3 a day if needed.  This seems to be helpful for her.  If the husband can remember to dose the drug in the early afternoon, this helps the agitation later on.  The patient is otherwise doing well with her ability to ambulate, her appetite is good.  She remains on Aricept.  She comes to the office today for an evaluation.   REVIEW OF SYSTEMS: Full 14 system review of systems performed and negative with exception of: as per HPI.   ALLERGIES: Allergies  Allergen Reactions   Bee Venom Swelling   Penicillins Swelling     Swelling of hands Has patient had a PCN reaction causing immediate rash, facial/tongue/throat swelling, SOB or lightheadedness with hypotension: Yes Has patient had a PCN reaction causing severe rash involving mucus membranes or skin necrosis: No Has patient had a PCN reaction that required hospitalization: No Has patient had a PCN reaction occurring within the last 10 years: No If all of the above answers are "NO", then may proceed with Cephalosporin use.    Sulfa Antibiotics Other (See Comments)    unknown    HOME MEDICATIONS: Outpatient Medications Prior to Visit  Medication Sig Dispense Refill   acetaminophen (TYLENOL) 325 MG tablet Take 2 tablets (650 mg total) by mouth every 6 (six) hours as needed for mild pain (or temp > 100). 30 tablet 0   Cholecalciferol (VITAMIN D) 2000 units CAPS Take 4,000 Units by mouth every evening.     Cinnamon 500 MG capsule Take 500 mg by mouth daily.     Cyanocobalamin (B-12) 2000 MCG TABS Take 2,000 mcg by mouth 3 (three) times a week.     donepezil (ARICEPT) 23 MG TABS tablet Take 1 tablet (23 mg total) by mouth at bedtime. 90 tablet 3   escitalopram (LEXAPRO) 10 MG tablet Take 1.5 tablets (15 mg total) by mouth daily. 135 tablet 1   fenofibrate (TRICOR) 48 MG tablet      levothyroxine (SYNTHROID, LEVOTHROID) 100 MCG tablet Take 100 mcg by mouth daily  before breakfast.     LORazepam (ATIVAN) 1 MG tablet Take 1 tablet (1 mg total) by mouth every 8 (eight) hours as needed for anxiety. 90 tablet 3   losartan (COZAAR) 50 MG tablet Take 50 mg by mouth every evening.      MENTHOL-ZINC OXIDE EX Apply 1 application topically daily. Medicated body Powder     Multiple Vitamins-Minerals (MULTIVITAMIN WITH MINERALS) tablet Take 1 tablet by mouth daily.     Multiple Vitamins-Minerals (PRESERVISION AREDS 2 PO) Take by mouth.     Omega-3 Fatty Acids (FISH OIL) 1000 MG CAPS Take 1 capsule by mouth 2 (two) times daily.     Pyridoxine HCl (VITAMIN B-6) 250 MG tablet  Take 250 mg by mouth daily.     Vitamins A & D (VITAMIN A & D) ointment Apply 1 application topically as needed for dry skin (rash).     No facility-administered medications prior to visit.      PHYSICAL EXAM  GENERAL EXAM/CONSTITUTIONAL: Vitals:  Vitals:   06/30/21 1312  BP: 131/73  Pulse: 69  Weight: 193 lb 3.2 oz (87.6 kg)  Height: 5\' 3"  (1.6 m)   Body mass index is 34.22 kg/m. Wt Readings from Last 3 Encounters:  06/30/21 193 lb 3.2 oz (87.6 kg)  11/30/20 203 lb (92.1 kg)  10/22/20 201 lb 9.6 oz (91.4 kg)   Patient is in no distress; well developed, nourished and groomed; neck is supple  CARDIOVASCULAR: Examination of carotid arteries is normal; no carotid bruits Regular rate and rhythm, no murmurs Examination of peripheral vascular system by observation and palpation is normal  EYES: Ophthalmoscopic exam of optic discs and posterior segments is normal; no papilledema or hemorrhages No results found.  MUSCULOSKELETAL: Gait, strength, tone, movements noted in Neurologic exam below  NEUROLOGIC: MENTAL STATUS:  MMSE - Mini Mental State Exam 06/30/2021 06/01/2020 11/27/2019  Not completed: - - -  Orientation to time 0 0 0  Orientation to Place 1 3 2   Registration 3 3 3   Attention/ Calculation 0 0 2  Attention/Calculation-comments - - spell world back wards  Recall 0 1 0  Language- name 2 objects 2 2 2   Language- repeat 0 0 1  Language- follow 3 step command 3 3 3   Language- follow 3 step command-comments - - -  Language- read & follow direction 1 1 1   Write a sentence 0 1 1  Copy design 1 0 1  Total score 11 14 16    awake, alert, oriented to person [place] attention and concentration language fluent, comprehension intact, naming intact fund of knowledge appropriate  CRANIAL NERVE:  2nd - no papilledema on fundoscopic exam 2nd, 3rd, 4th, 6th - pupils equal and reactive to light, visual fields full to confrontation, extraocular muscles intact,  no nystagmus 5th - facial sensation symmetric 7th - facial strength symmetric 8th - hearing intact 9th - palate elevates symmetrically, uvula midline 11th - shoulder shrug symmetric 12th - tongue protrusion midline  MOTOR:  normal bulk and tone, full strength in the BUE, BLE  SENSORY:  normal and symmetric to light touch  COORDINATION:  finger-nose-finger, fine finger movements normal  REFLEXES:  deep tendon reflexes present and symmetric  GAIT/STATION:  narrow based gait     DIAGNOSTIC DATA (LABS, IMAGING, TESTING) - I reviewed patient records, labs, notes, testing and imaging myself where available.  Lab Results  Component Value Date   WBC 6.2 12/20/2016   HGB 12.6 12/20/2016   HCT 39.4  12/20/2016   MCV 95.9 12/20/2016   PLT 200 12/20/2016      Component Value Date/Time   NA 142 12/20/2016 1021   K 4.7 12/20/2016 1021   CL 107 12/20/2016 1021   CO2 26 12/20/2016 1021   GLUCOSE 122 (H) 12/20/2016 1021   BUN 29 (H) 12/20/2016 1021   CREATININE 1.54 (H) 12/20/2016 1021   CALCIUM 9.9 12/20/2016 1021   PROT 6.9 07/03/2012 0617   ALBUMIN 3.9 07/03/2012 0617   AST 18 07/03/2012 0617   ALT 9 07/03/2012 0617   ALKPHOS 58 07/03/2012 0617   BILITOT 0.4 07/03/2012 0617   GFRNONAA 30 (L) 12/20/2016 1021   GFRAA 35 (L) 12/20/2016 1021   No results found for: CHOL, HDL, LDLCALC, LDLDIRECT, TRIG, CHOLHDL Lab Results  Component Value Date   HGBA1C 5.5 12/20/2016   Lab Results  Component Value Date   [id number] 568 10/29/2013   No results found for: TSH  04/19/16 CT head  - No skull fracture or intracranial hemorrhage. - Global atrophy. - Slightly polypoid opacification right sphenoid sinus. Moderate mucosal thickening left sphenoid sinus.    ASSESSMENT AND PLAN  87 y.o. year old female here with dementia since 2013.   Dx:  1. 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    Penni Bombard, MD 61/68/3729, 0:21 PM Certified in Neurology, Neurophysiology and Neuroimaging  [facility] [address], [place], [place] [address] [phone]

## 2021-06-30 NOTE — Addendum Note (Signed)
Addended by: Andrey Spearman R on: 06/30/2021 02:38 PM   Modules accepted: Orders

## 2021-06-30 NOTE — Patient Instructions (Signed)
°  MODERATE DEMENTIA WITH BEHAVIOR CHANGES  -CONTINUE Aricept 23 mg at bedtime  -CONTINUE Lexapro 15 mg daily for anxiety, 20 mg caused diarrhea, 10 mg not enough for anxiety  -CONTINUE on Ativan from PCP for anxiety 1 mg, 2-3 tablets daily

## 2021-07-05 ENCOUNTER — Telehealth: Payer: Self-pay | Admitting: Diagnostic Neuroimaging

## 2021-07-05 NOTE — Telephone Encounter (Signed)
Referral has been sent to [facility]. Phone: [phone].

## 2021-08-16 ENCOUNTER — Other Ambulatory Visit: Payer: Self-pay | Admitting: Neurology

## 2021-08-16 NOTE — Telephone Encounter (Signed)
Rx refilled.

## 2021-08-19 DIAGNOSIS — G309 Alzheimer's disease, unspecified: Secondary | ICD-10-CM | POA: Diagnosis not present

## 2021-08-19 DIAGNOSIS — M6281 Muscle weakness (generalized): Secondary | ICD-10-CM | POA: Diagnosis not present

## 2021-08-24 DIAGNOSIS — G309 Alzheimer's disease, unspecified: Secondary | ICD-10-CM | POA: Diagnosis not present

## 2021-08-24 DIAGNOSIS — M6281 Muscle weakness (generalized): Secondary | ICD-10-CM | POA: Diagnosis not present

## 2021-08-26 DIAGNOSIS — M6281 Muscle weakness (generalized): Secondary | ICD-10-CM | POA: Diagnosis not present

## 2021-08-26 DIAGNOSIS — G309 Alzheimer's disease, unspecified: Secondary | ICD-10-CM | POA: Diagnosis not present

## 2021-08-31 DIAGNOSIS — G309 Alzheimer's disease, unspecified: Secondary | ICD-10-CM | POA: Diagnosis not present

## 2021-08-31 DIAGNOSIS — M6281 Muscle weakness (generalized): Secondary | ICD-10-CM | POA: Diagnosis not present

## 2021-09-02 DIAGNOSIS — G309 Alzheimer's disease, unspecified: Secondary | ICD-10-CM | POA: Diagnosis not present

## 2021-09-02 DIAGNOSIS — M6281 Muscle weakness (generalized): Secondary | ICD-10-CM | POA: Diagnosis not present

## 2021-09-07 DIAGNOSIS — M6281 Muscle weakness (generalized): Secondary | ICD-10-CM | POA: Diagnosis not present

## 2021-09-07 DIAGNOSIS — G309 Alzheimer's disease, unspecified: Secondary | ICD-10-CM | POA: Diagnosis not present

## 2021-09-14 DIAGNOSIS — G309 Alzheimer's disease, unspecified: Secondary | ICD-10-CM | POA: Diagnosis not present

## 2021-09-14 DIAGNOSIS — M6281 Muscle weakness (generalized): Secondary | ICD-10-CM | POA: Diagnosis not present

## 2021-09-16 DIAGNOSIS — G309 Alzheimer's disease, unspecified: Secondary | ICD-10-CM | POA: Diagnosis not present

## 2021-09-16 DIAGNOSIS — M6281 Muscle weakness (generalized): Secondary | ICD-10-CM | POA: Diagnosis not present

## 2021-09-21 DIAGNOSIS — M6281 Muscle weakness (generalized): Secondary | ICD-10-CM | POA: Diagnosis not present

## 2021-09-21 DIAGNOSIS — G309 Alzheimer's disease, unspecified: Secondary | ICD-10-CM | POA: Diagnosis not present

## 2021-09-28 ENCOUNTER — Ambulatory Visit: Payer: Medicare Other | Admitting: Neurology

## 2021-11-08 DIAGNOSIS — I10 Essential (primary) hypertension: Secondary | ICD-10-CM | POA: Diagnosis not present

## 2021-11-08 DIAGNOSIS — E039 Hypothyroidism, unspecified: Secondary | ICD-10-CM | POA: Diagnosis not present

## 2021-11-08 DIAGNOSIS — E785 Hyperlipidemia, unspecified: Secondary | ICD-10-CM | POA: Diagnosis not present

## 2021-11-08 DIAGNOSIS — Z0000 Encounter for general adult medical examination without abnormal findings: Secondary | ICD-10-CM | POA: Diagnosis not present

## 2021-11-08 DIAGNOSIS — E119 Type 2 diabetes mellitus without complications: Secondary | ICD-10-CM | POA: Diagnosis not present

## 2021-11-18 DIAGNOSIS — B351 Tinea unguium: Secondary | ICD-10-CM | POA: Diagnosis not present

## 2021-11-18 DIAGNOSIS — N184 Chronic kidney disease, stage 4 (severe): Secondary | ICD-10-CM | POA: Diagnosis not present

## 2021-11-18 DIAGNOSIS — E039 Hypothyroidism, unspecified: Secondary | ICD-10-CM | POA: Diagnosis not present

## 2021-11-18 DIAGNOSIS — E785 Hyperlipidemia, unspecified: Secondary | ICD-10-CM | POA: Diagnosis not present

## 2021-11-18 DIAGNOSIS — Z0000 Encounter for general adult medical examination without abnormal findings: Secondary | ICD-10-CM | POA: Diagnosis not present

## 2021-11-18 DIAGNOSIS — I10 Essential (primary) hypertension: Secondary | ICD-10-CM | POA: Diagnosis not present

## 2021-11-18 DIAGNOSIS — R7303 Prediabetes: Secondary | ICD-10-CM | POA: Diagnosis not present

## 2021-11-26 ENCOUNTER — Other Ambulatory Visit: Payer: Self-pay | Admitting: Neurology

## 2021-11-29 NOTE — Telephone Encounter (Signed)
Rx refilled as per last office visit note. 

## 2021-12-21 DIAGNOSIS — C50912 Malignant neoplasm of unspecified site of left female breast: Secondary | ICD-10-CM | POA: Diagnosis not present

## 2021-12-26 ENCOUNTER — Ambulatory Visit
Admission: EM | Admit: 2021-12-26 | Discharge: 2021-12-26 | Disposition: A | Discharge status: Home / Self Care | Payer: Medicare Other | Attending: Emergency Medicine | Admitting: Emergency Medicine

## 2021-12-26 DIAGNOSIS — S61451A Open bite of right hand, initial encounter: Secondary | ICD-10-CM

## 2021-12-26 DIAGNOSIS — L03113 Cellulitis of right upper limb: Secondary | ICD-10-CM | POA: Diagnosis not present

## 2021-12-26 DIAGNOSIS — W5501XA Bitten by cat, initial encounter: Secondary | ICD-10-CM

## 2021-12-26 MED ORDER — CLINDAMYCIN HCL 300 MG PO CAPS: 300.0000 mg | ORAL_CAPSULE | Freq: Three times a day (TID) | ORAL | 0 refills | Status: AC

## 2021-12-26 MED ORDER — GENTAMICIN SULFATE 40 MG/ML IJ SOLN: 5.0000 mg/kg | Freq: Once | INTRAMUSCULAR | Status: DC

## 2021-12-26 MED ORDER — GENTAMICIN SULFATE 40 MG/ML IJ SOLN
80.0000 mg | Freq: Once | INTRAMUSCULAR | Status: AC
  Administered 2021-12-26: 80 mg via INTRAMUSCULAR

## 2021-12-26 NOTE — Discharge Instructions (Addendum)
To initiate antibiotic therapy for the infection in her right hand, she received an injection of a medication called gentamicin during her visit today.  Please pick up and begin taking clindamycin 300 mg 3 times daily for the next 5 days.  If possible, make sure she gets the first 2 doses today but no more than 3 doses today.  Please continue to monitor her hand for signs of worsening fever greater than 101, redness, swelling or drainage from the wound on her hand.  If any of these occur, please go to the emergency room for further evaluation and more aggressive treatment.

## 2021-12-26 NOTE — ED Provider Notes (Signed)
[facility]    CSN: [id number] Arrival date & time: 12/26/21  0859    HISTORY  No chief complaint on file.  HPI Kiara Nelson is a 88 y.o. female suffers from dementia. Patient presents with her husband today who states that 2 days ago, a Tom cat scratched her hand.  Has been states that patient has dementia, patient does not contribute history during the visit today.  Patient smiles and nods.  Husband states that the cat belongs to his now deceased neighbor and that they have been caring for it while eating food in the garage and leaving the garage door cracked so that he can come in at night to sleep.  Husband states that the cat sometimes comes in for short visit, will sit beside either patient or himself and allow them to pet it for a few minutes and then hops down and goes back outside.  He states he believes the cat scratched her this time because she attempted to pet it for too long.  He states that the cat is not showing any signs of rabies.  He also adds that patient is current on her tetanus vaccine.  The history is provided by the spouse.   Past Medical History:  Diagnosis Date   Anxiety    Arthritis    Back pain, chronic    lumbar up to thoracic area   Breast cancer ([place])    left breast 2006   Chronic low back pain 04/30/2014   Complex endometrial hyperplasia 2004   renal insufficiency   Dementia ([place])    Depression 04/30/2014   Diabetes mellitus without complication ([place])    Genetic testing 03/24/2017   Ms. Patrie underwent genetic counseling and testing for hereditary cancer syndromes on 02/01/2017. Her results were negative for mutations in all 46 genes analyzed by [facility]'s 46-gene Common Hereditary Cancers Panel. Genes analyzed include: APC, ATM, AXIN2, BARD1, BMPR1A, BRCA1, BRCA2, BRIP1, CDH1, CDKN2A, CHEK2, CTNNA1, DICER1, EPCAM, GREM1, HOXB13, KIT, MEN1, MLH1, MSH2, MSH3, MSH6, MUTYH, NBN,    Hypercholesteremia    Hypertension    Obese    OSA on  CPAP    Personal history of radiation therapy    left breast 2006   Pneumonia    patient's family stated "10-12 years ago"   Patient Active Problem List   Diagnosis Date Noted   Malignant neoplasm of lower-inner quadrant of left female breast ([place]) 10/13/2016   Malignant neoplasm of overlapping sites of left breast in female, estrogen receptor positive ([place]) 10/13/2016   Essential hypertension 04/09/2015   Morbid obesity ([place]) 04/09/2015   Diabetes ([place]) 04/09/2015   Hypothyroidism 04/09/2015   Depression 04/30/2014   Chronic low back pain 04/30/2014   Alzheimer disease [facility])    Past Surgical History:  Procedure Laterality Date   BREAST LUMPECTOMY Left    malignant 2006   CATARACT EXTRACTION, BILATERAL  09/2012   CYSTOSCOPY  07/03/2012   Procedure: CYSTOSCOPY;  Surgeon: Reece Packer, MD;  Location: [facility];  Service: Urology;;   DILATION AND CURETTAGE OF UTERUS  2004   polyp resection   EXAMINATION UNDER ANESTHESIA  07/03/2012   Procedure: EXAM UNDER ANESTHESIA;  Surgeon: Reece Packer, MD;  Location: [facility];  Service: Urology;  Laterality: N/A;   FOOT NEUROMA SURGERY     MASTECTOMY Left    SIMPLE MASTECTOMY WITH AXILLARY SENTINEL NODE BIOPSY Left 12/22/2016   Procedure: LEFT SIMPLE MASTECTOMY;  Surgeon: Erroll Luna, MD;  Location: [place] [facility];  Service: General;  Laterality: Left;  [place]   VAGINAL HYSTERECTOMY  07/03/2012   Procedure: HYSTERECTOMY VAGINAL;  Surgeon: Peri Maris, MD;  Location: [facility];  Service: Gynecology;  Laterality: N/A;   OB History     Gravida  3   Para  3   Term      Preterm      AB      Living  3      SAB      IAB      Ectopic      Multiple      Live Births             Home Medications    Prior to Admission medications   Medication Sig Start Date End Date Taking? Authorizing Provider  clindamycin (CLEOCIN) 300 MG capsule Take 1 capsule  (300 mg total) by mouth 3 (three) times daily for 5 days. 12/26/21 12/31/21 Yes Lynden Oxford Scales, PA-C  acetaminophen (TYLENOL) 325 MG tablet Take 2 tablets (650 mg total) by mouth every 6 (six) hours as needed for mild pain (or temp > 100). 12/23/16   Cornett, Marcello Moores, MD  Cholecalciferol (VITAMIN D) 2000 units CAPS Take 4,000 Units by mouth every evening.    [provider]  Cinnamon 500 MG capsule Take 500 mg by mouth daily.    [provider]  Cyanocobalamin (B-12) 2000 MCG TABS Take 2,000 mcg by mouth 3 (three) times a week.    [provider]  donepezil (ARICEPT) 23 MG TABS tablet Take 1 tablet (23 mg total) by mouth at bedtime. 11/30/20   Suzzanne Cloud, NP  escitalopram (LEXAPRO) 10 MG tablet TAKE 1 & 1/2 (ONE & ONE-HALF) TABLETS BY MOUTH ONCE DAILY 11/29/21   Suzzanne Cloud, NP  fenofibrate (TRICOR) 48 MG tablet  11/20/18   [provider]  levothyroxine (SYNTHROID, LEVOTHROID) 100 MCG tablet Take 100 mcg by mouth daily before breakfast.    [provider]  LORazepam (ATIVAN) 1 MG tablet Take 1 tablet (1 mg total) by mouth every 8 (eight) hours as needed for anxiety. 11/27/19   Kathrynn Ducking, MD  losartan (COZAAR) 50 MG tablet Take 50 mg by mouth every evening.     [provider]  MENTHOL-ZINC OXIDE EX Apply 1 application topically daily. Medicated body Powder    [provider]  Multiple Vitamins-Minerals (MULTIVITAMIN WITH MINERALS) tablet Take 1 tablet by mouth daily.    [provider]  Multiple Vitamins-Minerals (PRESERVISION AREDS 2 PO) Take by mouth.    [provider]  Omega-3 Fatty Acids (FISH OIL) 1000 MG CAPS Take 1 capsule by mouth 2 (two) times daily.    [provider]  Pyridoxine HCl (VITAMIN B-6) 250 MG tablet Take 250 mg by mouth daily.    [provider]  Vitamins A & D (VITAMIN A & D) ointment Apply 1 application topically as needed for dry skin (rash).    [provider]    Family History Family History  Problem Relation Age of Onset   Hypertension Mother    Heart disease Mother    Hypertension Father    Dementia Father    Depression Sister    Breast cancer Sister        d.85   Breast cancer Maternal Aunt 15       d.70s   Breast cancer Maternal Grandmother  88       d.80s   Social History Social History   Tobacco Use   Smoking status: Never   Smokeless tobacco: Never  Vaping Use   Vaping Use: Never used  Substance Use Topics   Alcohol use: Not Currently   Drug use: No   Allergies   Bee venom, Penicillins, and Sulfa antibiotics  Review of Systems Review of Systems Pertinent findings noted in history of present illness.   Physical Exam Triage Vital Signs ED Triage Vitals  Enc Vitals Group     BP 05/14/21 0827 (!) 147/82     Pulse Rate 05/14/21 0827 72     Resp 05/14/21 0827 18     Temp 05/14/21 0827 98.3 F (36.8 C)     Temp Source 05/14/21 0827 Oral     SpO2 05/14/21 0827 98 %     Weight --      Height --      Head Circumference --      Peak Flow --      Pain Score 05/14/21 0826 5     Pain Loc --      Pain Edu? --      Excl. in [place]? --   No data found.  Updated Vital Signs BP (!) 144/83 (BP Location: Right Arm)   Pulse 83   Temp 98.3 F (36.8 C) (Oral)   Resp 16   LMP 07/18/1969 (Approximate)   SpO2 96%   Physical Exam Vitals and nursing note reviewed.  Constitutional:      General: She is not in acute distress.    Appearance: Normal appearance. She is not ill-appearing.  HENT:     Head: Normocephalic and atraumatic.  Eyes:     General: Lids are normal.        Right eye: No discharge.        Left eye: No discharge.     Extraocular Movements: Extraocular movements intact.     Conjunctiva/sclera: Conjunctivae normal.     Right eye: Right conjunctiva is not injected.     Left eye: Left conjunctiva is not injected.  Neck:     Trachea: Trachea and phonation normal.  Cardiovascular:     Rate  and Rhythm: Normal rate and regular rhythm.     Pulses: Normal pulses.     Heart sounds: Normal heart sounds. No murmur heard.    No friction rub. No gallop.  Pulmonary:     Effort: Pulmonary effort is normal. No accessory muscle usage, prolonged expiration or respiratory distress.     Breath sounds: Normal breath sounds. No stridor, decreased air movement or transmitted upper airway sounds. No decreased breath sounds, wheezing, rhonchi or rales.  Chest:     Chest wall: No tenderness.  Musculoskeletal:        General: Normal range of motion.     Cervical back: Normal range of motion and neck supple. Normal range of motion.  Lymphadenopathy:     Cervical: No cervical adenopathy.  Skin:    General: Skin is warm and dry.     Findings: Lesion (1/2 cm cat scratch on back of right hand, hand is diffusely red, warm to touch and swollen up to wrist) present. No erythema or rash.  Neurological:     General: No focal deficit present.     Mental Status: She is alert and oriented to person, place, and time.  Psychiatric:        Attention and Perception: She is inattentive.  Mood and Affect: Mood and affect normal.        Speech: She is noncommunicative.        Behavior: Behavior is cooperative.        Cognition and Memory: Cognition is impaired. Memory is impaired.     Visual Acuity Right Eye Distance:   Left Eye Distance:   Bilateral Distance:    Right Eye Near:   Left Eye Near:    Bilateral Near:     UC Couse / Diagnostics / Procedures:    EKG  Radiology No results found.  Procedures Procedures (including critical care time)  UC Diagnoses / Final Clinical Impressions(s)   I have reviewed the triage vital signs and the nursing notes.  Pertinent labs & imaging results that were available during my care of the patient were reviewed by me and considered in my medical decision making (see chart for details).    Final diagnoses:  Cat bite of right hand, initial encounter   Cellulitis of right hand   Unclear whether lesion is due to a cat bite or scratch, will treat patient empirically for presumed bacterial infection from the wound with parental gentamicin during visit today and oral clindamycin given patient has due to penicillin and sulfa and severity of cellulitis.  Spouse advised to follow-up with PCP in the next 3 to 4 days for further evaluation if needed.  ED Prescriptions     Medication Sig Dispense Auth. Provider   clindamycin (CLEOCIN) 300 MG capsule Take 1 capsule (300 mg total) by mouth 3 (three) times daily for 5 days. 15 capsule Lynden Oxford Scales, PA-C      PDMP not reviewed this encounter.  Pending results:  Labs Reviewed - No data to display  Medications Ordered in UC: Medications  gentamicin (GARAMYCIN) injection 80 mg (80 mg Intramuscular Given 12/26/21 1017)    Disposition Upon Discharge:  Condition: stable for discharge home Home: take medications as prescribed; routine discharge instructions as discussed; follow up as advised.  Patient presented with an acute illness with associated systemic symptoms and significant discomfort requiring urgent management. In my opinion, this is a condition that a prudent lay person (someone who possesses an average knowledge of health and medicine) may potentially expect to result in complications if not addressed urgently such as respiratory distress, impairment of bodily function or dysfunction of bodily organs.   Routine symptom specific, illness specific and/or disease specific instructions were discussed with the patient and/or caregiver at length.   As such, the patient has been evaluated and assessed, work-up was performed and treatment was provided in alignment with urgent care protocols and evidence based medicine.  Patient/parent/caregiver has been advised that the patient may require follow up for further testing and treatment if the symptoms continue in spite of treatment, as  clinically indicated and appropriate.  Patient/parent/caregiver has been advised to return to the [facility] or PCP if no better; to PCP or the Emergency Department if new signs and symptoms develop, or if the current signs or symptoms continue to change or worsen for further workup, evaluation and treatment as clinically indicated and appropriate  The patient will follow up with their current PCP if and as advised. If the patient does not currently have a PCP we will assist them in obtaining one.   The patient may need specialty follow up if the symptoms continue, in spite of conservative treatment and management, for further workup, evaluation, consultation and treatment as clinically indicated and appropriate.   Patient/parent/caregiver  verbalized understanding and agreement of plan as discussed.  All questions were addressed during visit.  Please see discharge instructions below for further details of plan.  Discharge Instructions:   Discharge Instructions      To initiate antibiotic therapy for the infection in her right hand, she received an injection of a medication called gentamicin during her visit today.  Please pick up and begin taking clindamycin 300 mg 3 times daily for the next 5 days.  If possible, make sure she gets the first 2 doses today but no more than 3 doses today.  Please continue to monitor her hand for signs of worsening fever greater than 101, redness, swelling or drainage from the wound on her hand.  If any of these occur, please go to the emergency room for further evaluation and more aggressive treatment.    This office note has been dictated using Museum/gallery curator.  Unfortunately, and despite my best efforts, this method of dictation can sometimes lead to occasional typographical or grammatical errors.  I apologize in advance if this occurs.     Lynden Oxford Scales, PA-C 12/27/21 1329

## 2021-12-26 NOTE — ED Triage Notes (Signed)
Onset 2 nights ago, pt was scratch on the left hand by a Tom-cat. Pt is taking Tylenol for pain.

## 2022-01-08 ENCOUNTER — Other Ambulatory Visit: Payer: Self-pay | Admitting: Neurology

## 2022-01-12 ENCOUNTER — Telehealth: Payer: Self-pay | Admitting: Diagnostic Neuroimaging

## 2022-01-12 MED ORDER — DONEPEZIL HCL 23 MG PO TABS: 23.0000 mg | ORAL_TABLET | Freq: Every day | ORAL | 1 refills | Status: AC

## 2022-01-12 NOTE — Telephone Encounter (Signed)
Pt is requesting a refill for donepezil (ARICEPT) 23 MG TABS tablet.  Pharmacy: [id number]

## 2022-02-18 ENCOUNTER — Ambulatory Visit
Admission: EM | Admit: 2022-02-18 | Discharge: 2022-02-18 | Disposition: A | Discharge status: Home / Self Care | Payer: Medicare Other | Attending: Physician Assistant | Admitting: Physician Assistant

## 2022-02-18 DIAGNOSIS — L089 Local infection of the skin and subcutaneous tissue, unspecified: Secondary | ICD-10-CM | POA: Diagnosis not present

## 2022-02-18 DIAGNOSIS — T148XXA Other injury of unspecified body region, initial encounter: Secondary | ICD-10-CM

## 2022-02-18 MED ORDER — DOXYCYCLINE HYCLATE 100 MG PO CAPS: 100.0000 mg | ORAL_CAPSULE | Freq: Two times a day (BID) | ORAL | 0 refills | Status: AC

## 2022-02-18 NOTE — ED Triage Notes (Signed)
Pt presents with puncture wound from their tom-cat they inherited from their neighbor awhile ago; cat is outdoors & unsure of vaccine status.  Pt also has bruises from a fall a few days ago.

## 2022-02-18 NOTE — ED Provider Notes (Signed)
[facility]    CSN: [id number] Arrival date & time: 02/18/22  1348      History   Chief Complaint Chief Complaint  Patient presents with   Wound Check    HPI Kiara Nelson is a 88 y.o. female.   Patient here today for evaluation of a wound to her right forearm that occurred a few days ago.  Her husband is here with her and reports that they are not sure if wound came from a cat scratch or from possibly a rose bush.  She has not had fever.  Husband reports there was a similar lesion to the other side of her forearm that he had noticed purulent drainage from yesterday.  Husband also notes she has some bruising from recent falls.  Patient is poor historian herself due to mental status.  The history is provided by the patient.  Wound Check Pertinent negatives include no abdominal pain and no shortness of breath.    Past Medical History:  Diagnosis Date   Anxiety    Arthritis    Back pain, chronic    lumbar up to thoracic area   Breast cancer ([place])    left breast 2006   Chronic low back pain 04/30/2014   Complex endometrial hyperplasia 2004   renal insufficiency   Dementia ([place])    Depression 04/30/2014   Diabetes mellitus without complication ([place])    Genetic testing 03/24/2017   Ms. Cher[place] underwent genetic counseling and testing for hereditary cancer syndromes on 02/01/2017. Her results were negative for mutations in all 46 genes analyzed by [facility]'s 46-gene Common Hereditary Cancers Panel. Genes analyzed include: APC, ATM, AXIN2, BARD1, BMPR1A, BRCA1, BRCA2, BRIP1, CDH1, CDKN2A, CHEK2, CTNNA1, DICER1, EPCAM, GREM1, HOXB13, KIT, MEN1, MLH1, MSH2, MSH3, MSH6, MUTYH, NBN,    Hypercholesteremia    Hypertension    Obese    OSA on CPAP    Personal history of radiation therapy    left breast 2006   Pneumonia    patient's family stated "10-12 years ago"    Patient Active Problem List   Diagnosis Date Noted   Malignant neoplasm of lower-inner quadrant of left  female breast ([place]) 10/13/2016   Malignant neoplasm of overlapping sites of left breast in female, estrogen receptor positive ([place]) 10/13/2016   Essential hypertension 04/09/2015   Morbid obesity ([place]) 04/09/2015   Diabetes ([place]) 04/09/2015   Hypothyroidism 04/09/2015   Depression 04/30/2014   Chronic low back pain 04/30/2014   Alzheimer disease [facility])     Past Surgical History:  Procedure Laterality Date   BREAST LUMPECTOMY Left    malignant 2006   CATARACT EXTRACTION, BILATERAL  09/2012   CYSTOSCOPY  07/03/2012   Procedure: CYSTOSCOPY;  Surgeon: Reece Packer, MD;  Location: [facility];  Service: Urology;;   DILATION AND CURETTAGE OF UTERUS  2004   polyp resection   EXAMINATION UNDER ANESTHESIA  07/03/2012   Procedure: EXAM UNDER ANESTHESIA;  Surgeon: Reece Packer, MD;  Location: [facility];  Service: Urology;  Laterality: N/A;   FOOT NEUROMA SURGERY     MASTECTOMY Left    SIMPLE MASTECTOMY WITH AXILLARY SENTINEL NODE BIOPSY Left 12/22/2016   Procedure: LEFT SIMPLE MASTECTOMY;  Surgeon: Erroll Luna, MD;  Location: [facility];  Service: General;  Laterality: Left;  [place]   VAGINAL HYSTERECTOMY  07/03/2012   Procedure: HYSTERECTOMY VAGINAL;  Surgeon: Peri Maris, MD;  Location: [facility];  Service: Gynecology;  Laterality: N/A;    OB History     Gravida  3   Para  3   Term      Preterm      AB      Living  3      SAB      IAB      Ectopic      Multiple      Live Births               Home Medications    Prior to Admission medications   Medication Sig Start Date End Date Taking? Authorizing Provider  doxycycline (VIBRAMYCIN) 100 MG capsule Take 1 capsule (100 mg total) by mouth 2 (two) times daily. 02/18/22  Yes Francene Finders, PA-C  acetaminophen (TYLENOL) 325 MG tablet Take 2 tablets (650 mg total) by mouth every 6 (six) hours as needed for mild pain (or temp > 100). 12/23/16    Cornett, Marcello Moores, MD  Cholecalciferol (VITAMIN D) 2000 units CAPS Take 4,000 Units by mouth every evening.    [provider]  Cinnamon 500 MG capsule Take 500 mg by mouth daily.    [provider]  Cyanocobalamin (B-12) 2000 MCG TABS Take 2,000 mcg by mouth 3 (three) times a week.    [provider]  donepezil (ARICEPT) 23 MG TABS tablet Take 1 tablet (23 mg total) by mouth at bedtime. 01/12/22   Penumalli, Earlean Polka, MD  escitalopram (LEXAPRO) 10 MG tablet TAKE 1 & 1/2 (ONE & ONE-HALF) TABLETS BY MOUTH ONCE DAILY 11/29/21   Suzzanne Cloud, NP  fenofibrate (TRICOR) 48 MG tablet  11/20/18   [provider]  levothyroxine (SYNTHROID, LEVOTHROID) 100 MCG tablet Take 100 mcg by mouth daily before breakfast.    [provider]  LORazepam (ATIVAN) 1 MG tablet Take 1 tablet (1 mg total) by mouth every 8 (eight) hours as needed for anxiety. 11/27/19   Kathrynn Ducking, MD  losartan (COZAAR) 50 MG tablet Take 50 mg by mouth every evening.     [provider]  MENTHOL-ZINC OXIDE EX Apply 1 application topically daily. Medicated body Powder    [provider]  Multiple Vitamins-Minerals (MULTIVITAMIN WITH MINERALS) tablet Take 1 tablet by mouth daily.    [provider]  Multiple Vitamins-Minerals (PRESERVISION AREDS 2 PO) Take by mouth.    [provider]  Omega-3 Fatty Acids (FISH OIL) 1000 MG CAPS Take 1 capsule by mouth 2 (two) times daily.    [provider]  Pyridoxine HCl (VITAMIN B-6) 250 MG tablet Take 250 mg by mouth daily.    [provider]  Vitamins A & D (VITAMIN A & D) ointment Apply 1 application topically as needed for dry skin (rash).    [provider]    Family History Family History  Problem Relation Age of Onset   Hypertension Mother    Heart disease Mother    Hypertension Father    Dementia Father    Depression Sister    Breast cancer Sister        d.85   Breast cancer  Maternal Aunt 60       d.70s   Breast cancer Maternal Grandmother 60       d.80s    Social History Social History   Tobacco Use   Smoking status: Never   Smokeless tobacco: Never  Vaping Use   Vaping Use: Never used  Substance  Use Topics   Alcohol use: Not Currently   Drug use: No     Allergies   Bee venom, Penicillins, and Sulfa antibiotics   Review of Systems Review of Systems  Constitutional:  Negative for chills and fever.  Eyes:  Negative for discharge and redness.  Respiratory:  Negative for shortness of breath.   Gastrointestinal:  Negative for abdominal pain, nausea and vomiting.  Skin:  Positive for color change and wound.     Physical Exam Triage Vital Signs ED Triage Vitals  Enc Vitals Group     BP 02/18/22 1400 112/71     Pulse Rate 02/18/22 1400 64     Resp 02/18/22 1400 16     Temp 02/18/22 1400 98.1 F (36.7 C)     Temp Source 02/18/22 1400 Oral     SpO2 02/18/22 1400 96 %     Weight --      Height --      Head Circumference --      Peak Flow --      Pain Score 02/18/22 1405 1     Pain Loc --      Pain Edu? --      Excl. in [place]? --    No data found.  Updated Vital Signs BP 112/71 (BP Location: Left Arm)   Pulse 64   Temp 98.1 F (36.7 C) (Oral)   Resp 16   LMP 07/18/1969 (Approximate)   SpO2 96%       Physical Exam Vitals and nursing note reviewed.  Constitutional:      General: She is not in acute distress.    Appearance: Normal appearance. She is not ill-appearing.  HENT:     Head: Normocephalic and atraumatic.  Eyes:     Conjunctiva/sclera: Conjunctivae normal.  Cardiovascular:     Rate and Rhythm: Normal rate.  Pulmonary:     Effort: Pulmonary effort is normal.  Skin:    Comments: Approx 1 cm wound with crusting and surrounding erythema and mild induration to right forearm, no active bleeding or drainage, other bruising noted to right wrist, left upper arm  Neurological:     Mental Status: She is alert.   Psychiatric:        Mood and Affect: Mood normal.        Behavior: Behavior normal.        Thought Content: Thought content normal.      UC Treatments / Results  Labs (all labs ordered are listed, but only abnormal results are displayed) Labs Reviewed - No data to display  EKG   Radiology No results found.  Procedures Procedures (including critical care time)  Medications Ordered in UC Medications - No data to display  Initial Impression / Assessment and Plan / UC Course  I have reviewed the triage vital signs and the nursing notes.  Pertinent labs & imaging results that were available during my care of the patient were reviewed by me and considered in my medical decision making (see chart for details).    Antibiotic prescribed to cover possible infection of wound given erythema and swelling.  Encouraged follow-up if no gradual improvement with treatment or symptoms worsen anyway.  Final Clinical Impressions(s) / UC Diagnoses   Final diagnoses:  Infected wound   Discharge Instructions   None    ED Prescriptions     Medication Sig Dispense Auth. Provider   doxycycline (VIBRAMYCIN) 100 MG capsule Take 1 capsule (100 mg total) by mouth 2 (two)  times daily. 20 capsule Francene Finders, PA-C      PDMP not reviewed this encounter.   Francene Finders, PA-C 02/18/22 1921

## 2022-02-22 ENCOUNTER — Other Ambulatory Visit: Payer: Self-pay | Admitting: Surgery

## 2022-02-22 DIAGNOSIS — Z1231 Encounter for screening mammogram for malignant neoplasm of breast: Secondary | ICD-10-CM

## 2022-03-18 ENCOUNTER — Ambulatory Visit: Payer: Medicare Other

## 2022-04-08 ENCOUNTER — Ambulatory Visit
Admission: RE | Admit: 2022-04-08 | Discharge: 2022-04-08 | Disposition: A | Discharge status: Home / Self Care | Payer: Medicare Other | Source: Ambulatory Visit | Attending: Surgery | Admitting: Surgery

## 2022-04-08 DIAGNOSIS — Z1231 Encounter for screening mammogram for malignant neoplasm of breast: Secondary | ICD-10-CM | POA: Diagnosis not present

## 2022-05-18 DIAGNOSIS — E785 Hyperlipidemia, unspecified: Secondary | ICD-10-CM | POA: Diagnosis not present

## 2022-05-18 DIAGNOSIS — N184 Chronic kidney disease, stage 4 (severe): Secondary | ICD-10-CM | POA: Diagnosis not present

## 2022-05-18 DIAGNOSIS — R7303 Prediabetes: Secondary | ICD-10-CM | POA: Diagnosis not present

## 2022-05-18 DIAGNOSIS — E039 Hypothyroidism, unspecified: Secondary | ICD-10-CM | POA: Diagnosis not present

## 2022-05-23 DIAGNOSIS — H52203 Unspecified astigmatism, bilateral: Secondary | ICD-10-CM | POA: Diagnosis not present

## 2022-05-23 DIAGNOSIS — H353132 Nonexudative age-related macular degeneration, bilateral, intermediate dry stage: Secondary | ICD-10-CM | POA: Diagnosis not present

## 2022-05-23 DIAGNOSIS — Z961 Presence of intraocular lens: Secondary | ICD-10-CM | POA: Diagnosis not present

## 2022-05-23 DIAGNOSIS — E119 Type 2 diabetes mellitus without complications: Secondary | ICD-10-CM | POA: Diagnosis not present

## 2022-05-25 DIAGNOSIS — Z23 Encounter for immunization: Secondary | ICD-10-CM | POA: Diagnosis not present

## 2022-05-25 DIAGNOSIS — E785 Hyperlipidemia, unspecified: Secondary | ICD-10-CM | POA: Diagnosis not present

## 2022-05-25 DIAGNOSIS — E039 Hypothyroidism, unspecified: Secondary | ICD-10-CM | POA: Diagnosis not present

## 2022-05-25 DIAGNOSIS — I10 Essential (primary) hypertension: Secondary | ICD-10-CM | POA: Diagnosis not present

## 2022-05-25 DIAGNOSIS — N184 Chronic kidney disease, stage 4 (severe): Secondary | ICD-10-CM | POA: Diagnosis not present

## 2022-05-25 DIAGNOSIS — R7303 Prediabetes: Secondary | ICD-10-CM | POA: Diagnosis not present

## 2022-06-16 DIAGNOSIS — M6281 Muscle weakness (generalized): Secondary | ICD-10-CM | POA: Diagnosis not present

## 2022-06-27 DIAGNOSIS — M6281 Muscle weakness (generalized): Secondary | ICD-10-CM | POA: Diagnosis not present

## 2022-06-29 DIAGNOSIS — M6281 Muscle weakness (generalized): Secondary | ICD-10-CM | POA: Diagnosis not present

## 2022-07-04 DIAGNOSIS — M6281 Muscle weakness (generalized): Secondary | ICD-10-CM | POA: Diagnosis not present

## 2022-07-06 DIAGNOSIS — M6281 Muscle weakness (generalized): Secondary | ICD-10-CM | POA: Diagnosis not present

## 2022-07-12 DIAGNOSIS — M6281 Muscle weakness (generalized): Secondary | ICD-10-CM | POA: Diagnosis not present

## 2022-07-14 ENCOUNTER — Other Ambulatory Visit: Payer: Self-pay | Admitting: Neurology

## 2022-07-15 DIAGNOSIS — M6281 Muscle weakness (generalized): Secondary | ICD-10-CM | POA: Diagnosis not present

## 2022-07-20 ENCOUNTER — Other Ambulatory Visit: Payer: Self-pay | Admitting: Neurology

## 2022-07-25 DIAGNOSIS — M6281 Muscle weakness (generalized): Secondary | ICD-10-CM | POA: Diagnosis not present

## 2022-07-29 ENCOUNTER — Other Ambulatory Visit: Payer: Self-pay | Admitting: Diagnostic Neuroimaging

## 2022-07-29 DIAGNOSIS — M6281 Muscle weakness (generalized): Secondary | ICD-10-CM | POA: Diagnosis not present

## 2022-08-01 DIAGNOSIS — M6281 Muscle weakness (generalized): Secondary | ICD-10-CM | POA: Diagnosis not present

## 2022-08-03 DIAGNOSIS — R2681 Unsteadiness on feet: Secondary | ICD-10-CM | POA: Diagnosis not present

## 2022-08-03 DIAGNOSIS — M25512 Pain in left shoulder: Secondary | ICD-10-CM | POA: Diagnosis not present

## 2022-08-03 DIAGNOSIS — M25612 Stiffness of left shoulder, not elsewhere classified: Secondary | ICD-10-CM | POA: Diagnosis not present

## 2022-08-03 DIAGNOSIS — R531 Weakness: Secondary | ICD-10-CM | POA: Diagnosis not present

## 2022-08-08 DIAGNOSIS — M6281 Muscle weakness (generalized): Secondary | ICD-10-CM | POA: Diagnosis not present

## 2022-08-10 DIAGNOSIS — M6281 Muscle weakness (generalized): Secondary | ICD-10-CM | POA: Diagnosis not present

## 2022-08-15 DIAGNOSIS — M6281 Muscle weakness (generalized): Secondary | ICD-10-CM | POA: Diagnosis not present

## 2022-08-23 DIAGNOSIS — M6281 Muscle weakness (generalized): Secondary | ICD-10-CM | POA: Diagnosis not present

## 2022-08-23 DIAGNOSIS — H903 Sensorineural hearing loss, bilateral: Secondary | ICD-10-CM | POA: Diagnosis not present

## 2022-09-06 DIAGNOSIS — M6281 Muscle weakness (generalized): Secondary | ICD-10-CM | POA: Diagnosis not present

## 2022-09-13 DIAGNOSIS — M6281 Muscle weakness (generalized): Secondary | ICD-10-CM | POA: Diagnosis not present

## 2022-09-19 DIAGNOSIS — C50912 Malignant neoplasm of unspecified site of left female breast: Secondary | ICD-10-CM | POA: Diagnosis not present

## 2022-09-20 DIAGNOSIS — M6281 Muscle weakness (generalized): Secondary | ICD-10-CM | POA: Diagnosis not present

## 2022-11-24 DIAGNOSIS — N184 Chronic kidney disease, stage 4 (severe): Secondary | ICD-10-CM | POA: Diagnosis not present

## 2022-11-24 DIAGNOSIS — E785 Hyperlipidemia, unspecified: Secondary | ICD-10-CM | POA: Diagnosis not present

## 2022-11-24 DIAGNOSIS — R7303 Prediabetes: Secondary | ICD-10-CM | POA: Diagnosis not present

## 2022-11-24 DIAGNOSIS — E039 Hypothyroidism, unspecified: Secondary | ICD-10-CM | POA: Diagnosis not present

## 2022-11-24 DIAGNOSIS — I10 Essential (primary) hypertension: Secondary | ICD-10-CM | POA: Diagnosis not present

## 2022-12-06 DIAGNOSIS — I10 Essential (primary) hypertension: Secondary | ICD-10-CM | POA: Diagnosis not present

## 2022-12-06 DIAGNOSIS — E785 Hyperlipidemia, unspecified: Secondary | ICD-10-CM | POA: Diagnosis not present

## 2022-12-06 DIAGNOSIS — R7303 Prediabetes: Secondary | ICD-10-CM | POA: Diagnosis not present

## 2022-12-06 DIAGNOSIS — E039 Hypothyroidism, unspecified: Secondary | ICD-10-CM | POA: Diagnosis not present

## 2022-12-06 DIAGNOSIS — Z0000 Encounter for general adult medical examination without abnormal findings: Secondary | ICD-10-CM | POA: Diagnosis not present

## 2022-12-06 DIAGNOSIS — N184 Chronic kidney disease, stage 4 (severe): Secondary | ICD-10-CM | POA: Diagnosis not present

## 2022-12-23 DIAGNOSIS — L98491 Non-pressure chronic ulcer of skin of other sites limited to breakdown of skin: Secondary | ICD-10-CM | POA: Diagnosis not present

## 2022-12-23 DIAGNOSIS — Z853 Personal history of malignant neoplasm of breast: Secondary | ICD-10-CM | POA: Diagnosis not present

## 2022-12-23 DIAGNOSIS — N184 Chronic kidney disease, stage 4 (severe): Secondary | ICD-10-CM | POA: Diagnosis not present

## 2022-12-23 DIAGNOSIS — R350 Frequency of micturition: Secondary | ICD-10-CM | POA: Diagnosis not present

## 2023-05-22 ENCOUNTER — Other Ambulatory Visit: Payer: Self-pay

## 2023-05-22 DIAGNOSIS — Z1231 Encounter for screening mammogram for malignant neoplasm of breast: Secondary | ICD-10-CM

## 2023-06-01 DIAGNOSIS — E119 Type 2 diabetes mellitus without complications: Secondary | ICD-10-CM | POA: Diagnosis not present

## 2023-06-01 DIAGNOSIS — Z961 Presence of intraocular lens: Secondary | ICD-10-CM | POA: Diagnosis not present

## 2023-06-01 DIAGNOSIS — H524 Presbyopia: Secondary | ICD-10-CM | POA: Diagnosis not present

## 2023-06-01 DIAGNOSIS — H353132 Nonexudative age-related macular degeneration, bilateral, intermediate dry stage: Secondary | ICD-10-CM | POA: Diagnosis not present

## 2023-06-05 DIAGNOSIS — E785 Hyperlipidemia, unspecified: Secondary | ICD-10-CM | POA: Diagnosis not present

## 2023-06-05 DIAGNOSIS — E039 Hypothyroidism, unspecified: Secondary | ICD-10-CM | POA: Diagnosis not present

## 2023-06-05 DIAGNOSIS — N184 Chronic kidney disease, stage 4 (severe): Secondary | ICD-10-CM | POA: Diagnosis not present

## 2023-06-05 DIAGNOSIS — R7303 Prediabetes: Secondary | ICD-10-CM | POA: Diagnosis not present

## 2023-06-12 DIAGNOSIS — E039 Hypothyroidism, unspecified: Secondary | ICD-10-CM | POA: Diagnosis not present

## 2023-06-12 DIAGNOSIS — Z23 Encounter for immunization: Secondary | ICD-10-CM | POA: Diagnosis not present

## 2023-06-12 DIAGNOSIS — E785 Hyperlipidemia, unspecified: Secondary | ICD-10-CM | POA: Diagnosis not present

## 2023-06-12 DIAGNOSIS — I10 Essential (primary) hypertension: Secondary | ICD-10-CM | POA: Diagnosis not present

## 2023-06-12 DIAGNOSIS — N3001 Acute cystitis with hematuria: Secondary | ICD-10-CM | POA: Diagnosis not present

## 2023-06-12 DIAGNOSIS — N184 Chronic kidney disease, stage 4 (severe): Secondary | ICD-10-CM | POA: Diagnosis not present

## 2023-06-13 ENCOUNTER — Ambulatory Visit
Admission: RE | Admit: 2023-06-13 | Discharge: 2023-06-13 | Disposition: A | Discharge status: Home / Self Care | Payer: Medicare Other | Source: Ambulatory Visit

## 2023-06-13 ENCOUNTER — Ambulatory Visit: Payer: Medicare Other

## 2023-06-13 DIAGNOSIS — Z1231 Encounter for screening mammogram for malignant neoplasm of breast: Secondary | ICD-10-CM

## 2023-12-05 DIAGNOSIS — E785 Hyperlipidemia, unspecified: Secondary | ICD-10-CM | POA: Diagnosis not present

## 2023-12-05 DIAGNOSIS — R7303 Prediabetes: Secondary | ICD-10-CM | POA: Diagnosis not present

## 2023-12-05 DIAGNOSIS — N184 Chronic kidney disease, stage 4 (severe): Secondary | ICD-10-CM | POA: Diagnosis not present

## 2023-12-05 DIAGNOSIS — E039 Hypothyroidism, unspecified: Secondary | ICD-10-CM | POA: Diagnosis not present

## 2023-12-05 DIAGNOSIS — N3001 Acute cystitis with hematuria: Secondary | ICD-10-CM | POA: Diagnosis not present

## 2023-12-18 DIAGNOSIS — E785 Hyperlipidemia, unspecified: Secondary | ICD-10-CM | POA: Diagnosis not present

## 2023-12-18 DIAGNOSIS — E039 Hypothyroidism, unspecified: Secondary | ICD-10-CM | POA: Diagnosis not present

## 2023-12-18 DIAGNOSIS — I10 Essential (primary) hypertension: Secondary | ICD-10-CM | POA: Diagnosis not present

## 2023-12-18 DIAGNOSIS — R7303 Prediabetes: Secondary | ICD-10-CM | POA: Diagnosis not present

## 2023-12-18 DIAGNOSIS — N184 Chronic kidney disease, stage 4 (severe): Secondary | ICD-10-CM | POA: Diagnosis not present

## 2023-12-18 DIAGNOSIS — Z0000 Encounter for general adult medical examination without abnormal findings: Secondary | ICD-10-CM | POA: Diagnosis not present

## 2023-12-18 DIAGNOSIS — N3001 Acute cystitis with hematuria: Secondary | ICD-10-CM | POA: Diagnosis not present

## 2024-01-29 DIAGNOSIS — E039 Hypothyroidism, unspecified: Secondary | ICD-10-CM | POA: Diagnosis not present

## 2024-03-12 DIAGNOSIS — N184 Chronic kidney disease, stage 4 (severe): Secondary | ICD-10-CM | POA: Diagnosis not present

## 2024-03-12 DIAGNOSIS — N3001 Acute cystitis with hematuria: Secondary | ICD-10-CM | POA: Diagnosis not present

## 2024-03-12 DIAGNOSIS — E039 Hypothyroidism, unspecified: Secondary | ICD-10-CM | POA: Diagnosis not present

## 2024-03-19 DIAGNOSIS — I10 Essential (primary) hypertension: Secondary | ICD-10-CM | POA: Diagnosis not present

## 2024-03-19 DIAGNOSIS — N3001 Acute cystitis with hematuria: Secondary | ICD-10-CM | POA: Diagnosis not present

## 2024-03-19 DIAGNOSIS — R7303 Prediabetes: Secondary | ICD-10-CM | POA: Diagnosis not present

## 2024-03-19 DIAGNOSIS — N390 Urinary tract infection, site not specified: Secondary | ICD-10-CM | POA: Diagnosis not present

## 2024-03-19 DIAGNOSIS — E785 Hyperlipidemia, unspecified: Secondary | ICD-10-CM | POA: Diagnosis not present

## 2024-03-19 DIAGNOSIS — N184 Chronic kidney disease, stage 4 (severe): Secondary | ICD-10-CM | POA: Diagnosis not present

## 2024-03-19 DIAGNOSIS — E039 Hypothyroidism, unspecified: Secondary | ICD-10-CM | POA: Diagnosis not present

## 2024-05-14 ENCOUNTER — Other Ambulatory Visit: Payer: Self-pay

## 2024-05-14 DIAGNOSIS — Z1231 Encounter for screening mammogram for malignant neoplasm of breast: Secondary | ICD-10-CM

## 2024-06-18 ENCOUNTER — Ambulatory Visit: Admission: RE | Admit: 2024-06-18 | Discharge: 2024-06-18 | Disposition: A | Source: Ambulatory Visit

## 2024-06-18 DIAGNOSIS — Z1231 Encounter for screening mammogram for malignant neoplasm of breast: Secondary | ICD-10-CM
